# Patient Record
Sex: Female | Born: 1945
Health system: Southern US, Community
[De-identification: ages and names within clinical notes are randomized; demographics above are authoritative.]

## PROBLEM LIST (undated history)

## (undated) DIAGNOSIS — K219 Gastro-esophageal reflux disease without esophagitis: Secondary | ICD-10-CM

## (undated) DIAGNOSIS — F32A Depression, unspecified: Secondary | ICD-10-CM

## (undated) DIAGNOSIS — M1990 Unspecified osteoarthritis, unspecified site: Secondary | ICD-10-CM

## (undated) DIAGNOSIS — H269 Unspecified cataract: Secondary | ICD-10-CM

## (undated) DIAGNOSIS — C801 Malignant (primary) neoplasm, unspecified: Secondary | ICD-10-CM

## (undated) HISTORY — DX: Gastro-esophageal reflux disease without esophagitis: K21.9

## (undated) HISTORY — PX: EYE SURGERY: SHX253

## (undated) HISTORY — DX: Unspecified cataract: H26.9

## (undated) HISTORY — DX: Depression, unspecified: F32.A

---

## 1998-03-12 ENCOUNTER — Other Ambulatory Visit: Admission: RE | Admit: 1998-03-12 | Discharge: 1998-03-12 | Payer: Self-pay | Admitting: Family Medicine

## 1999-04-13 ENCOUNTER — Other Ambulatory Visit: Admission: RE | Admit: 1999-04-13 | Discharge: 1999-04-13 | Payer: Self-pay | Admitting: Family Medicine

## 2000-05-29 ENCOUNTER — Other Ambulatory Visit: Admission: RE | Admit: 2000-05-29 | Discharge: 2000-05-29 | Payer: Self-pay | Admitting: Family Medicine

## 2001-06-04 ENCOUNTER — Other Ambulatory Visit: Admission: RE | Admit: 2001-06-04 | Discharge: 2001-06-04 | Payer: Self-pay | Admitting: Family Medicine

## 2001-06-13 HISTORY — PX: CHOLECYSTECTOMY: SHX55

## 2002-06-07 ENCOUNTER — Other Ambulatory Visit: Admission: RE | Admit: 2002-06-07 | Discharge: 2002-06-07 | Payer: Self-pay | Admitting: Family Medicine

## 2003-10-03 ENCOUNTER — Other Ambulatory Visit: Admission: RE | Admit: 2003-10-03 | Discharge: 2003-10-03 | Payer: Self-pay | Admitting: Family Medicine

## 2004-06-14 HISTORY — PX: ACHILLES TENDON REPAIR: SUR1153

## 2004-10-06 ENCOUNTER — Ambulatory Visit: Payer: Self-pay | Admitting: Family Medicine

## 2004-10-08 ENCOUNTER — Encounter: Payer: Self-pay | Admitting: Family Medicine

## 2004-10-08 ENCOUNTER — Ambulatory Visit: Payer: Self-pay | Admitting: Family Medicine

## 2004-10-08 ENCOUNTER — Other Ambulatory Visit: Admission: RE | Admit: 2004-10-08 | Discharge: 2004-10-08 | Payer: Self-pay | Admitting: Family Medicine

## 2004-10-22 ENCOUNTER — Ambulatory Visit: Payer: Self-pay | Admitting: Family Medicine

## 2004-11-18 ENCOUNTER — Ambulatory Visit: Payer: Self-pay | Admitting: Family Medicine

## 2004-12-08 ENCOUNTER — Ambulatory Visit: Payer: Self-pay | Admitting: Family Medicine

## 2005-01-07 ENCOUNTER — Ambulatory Visit: Payer: Self-pay | Admitting: Family Medicine

## 2005-01-12 ENCOUNTER — Ambulatory Visit: Payer: Self-pay | Admitting: Family Medicine

## 2005-10-10 ENCOUNTER — Ambulatory Visit: Payer: Self-pay | Admitting: Family Medicine

## 2005-10-13 ENCOUNTER — Ambulatory Visit: Payer: Self-pay | Admitting: Family Medicine

## 2005-10-13 ENCOUNTER — Encounter: Payer: Self-pay | Admitting: Family Medicine

## 2005-10-13 ENCOUNTER — Other Ambulatory Visit: Admission: RE | Admit: 2005-10-13 | Discharge: 2005-10-13 | Payer: Self-pay | Admitting: Family Medicine

## 2005-10-13 LAB — CONVERTED CEMR LAB: Pap Smear: NORMAL

## 2005-10-26 ENCOUNTER — Ambulatory Visit: Payer: Self-pay | Admitting: Family Medicine

## 2005-12-02 ENCOUNTER — Ambulatory Visit: Payer: Self-pay | Admitting: Family Medicine

## 2006-04-27 ENCOUNTER — Ambulatory Visit: Payer: Self-pay | Admitting: Family Medicine

## 2006-10-03 ENCOUNTER — Encounter: Payer: Self-pay | Admitting: Family Medicine

## 2006-10-04 DIAGNOSIS — N951 Menopausal and female climacteric states: Secondary | ICD-10-CM | POA: Insufficient documentation

## 2006-10-04 DIAGNOSIS — N809 Endometriosis, unspecified: Secondary | ICD-10-CM

## 2006-10-04 DIAGNOSIS — E7800 Pure hypercholesterolemia, unspecified: Secondary | ICD-10-CM | POA: Insufficient documentation

## 2006-10-04 HISTORY — DX: Endometriosis, unspecified: N80.9

## 2006-10-25 ENCOUNTER — Ambulatory Visit: Payer: Self-pay | Admitting: Family Medicine

## 2006-10-25 LAB — CONVERTED CEMR LAB
AST: 23 units/L (ref 0–37)
BUN: 16 mg/dL (ref 6–23)
CO2: 29 meq/L (ref 19–32)
Calcium: 9.1 mg/dL (ref 8.4–10.5)
Creatinine, Ser: 0.8 mg/dL (ref 0.4–1.2)
TSH: 1.15 microintl units/mL (ref 0.35–5.50)
Triglycerides: 88 mg/dL (ref 0–149)

## 2006-10-30 ENCOUNTER — Ambulatory Visit: Payer: Self-pay | Admitting: Family Medicine

## 2006-10-30 ENCOUNTER — Other Ambulatory Visit: Admission: RE | Admit: 2006-10-30 | Discharge: 2006-10-30 | Payer: Self-pay | Admitting: Family Medicine

## 2006-10-30 ENCOUNTER — Encounter: Payer: Self-pay | Admitting: Family Medicine

## 2006-11-02 ENCOUNTER — Encounter (INDEPENDENT_AMBULATORY_CARE_PROVIDER_SITE_OTHER): Payer: Self-pay | Admitting: *Deleted

## 2006-11-14 ENCOUNTER — Ambulatory Visit: Payer: Self-pay | Admitting: Family Medicine

## 2006-11-15 ENCOUNTER — Encounter (INDEPENDENT_AMBULATORY_CARE_PROVIDER_SITE_OTHER): Payer: Self-pay | Admitting: *Deleted

## 2006-12-04 ENCOUNTER — Encounter: Payer: Self-pay | Admitting: Family Medicine

## 2006-12-04 ENCOUNTER — Ambulatory Visit: Payer: Self-pay | Admitting: Family Medicine

## 2006-12-05 ENCOUNTER — Encounter (INDEPENDENT_AMBULATORY_CARE_PROVIDER_SITE_OTHER): Payer: Self-pay | Admitting: *Deleted

## 2007-02-05 ENCOUNTER — Telehealth (INDEPENDENT_AMBULATORY_CARE_PROVIDER_SITE_OTHER): Payer: Self-pay | Admitting: *Deleted

## 2007-03-20 ENCOUNTER — Encounter: Payer: Self-pay | Admitting: Family Medicine

## 2007-03-29 ENCOUNTER — Encounter: Payer: Self-pay | Admitting: Family Medicine

## 2007-08-06 ENCOUNTER — Ambulatory Visit: Payer: Self-pay | Admitting: Family Medicine

## 2007-11-02 ENCOUNTER — Ambulatory Visit: Payer: Self-pay | Admitting: Family Medicine

## 2007-11-03 LAB — CONVERTED CEMR LAB
ALT: 20 units/L (ref 0–35)
Bilirubin, Direct: 0.1 mg/dL (ref 0.0–0.3)
CO2: 28 meq/L (ref 19–32)
Calcium: 9.2 mg/dL (ref 8.4–10.5)
Creatinine, Ser: 0.8 mg/dL (ref 0.4–1.2)
Glucose, Bld: 89 mg/dL (ref 70–99)
HDL: 62.6 mg/dL (ref >39.0)
TSH: 1.26 microintl units/mL (ref 0.35–5.50)
Total Protein: 6.6 g/dL (ref 6.0–8.3)
Triglycerides: 60 mg/dL (ref 0–149)

## 2007-11-05 ENCOUNTER — Other Ambulatory Visit: Admission: RE | Admit: 2007-11-05 | Discharge: 2007-11-05 | Payer: Self-pay | Admitting: Family Medicine

## 2007-11-05 ENCOUNTER — Ambulatory Visit: Payer: Self-pay | Admitting: Family Medicine

## 2007-11-05 ENCOUNTER — Encounter: Payer: Self-pay | Admitting: Family Medicine

## 2007-11-07 ENCOUNTER — Encounter (INDEPENDENT_AMBULATORY_CARE_PROVIDER_SITE_OTHER): Payer: Self-pay | Admitting: *Deleted

## 2007-11-30 ENCOUNTER — Ambulatory Visit: Payer: Self-pay | Admitting: Family Medicine

## 2007-11-30 LAB — CONVERTED CEMR LAB
OCCULT 1: NEGATIVE
OCCULT 2: NEGATIVE
OCCULT 3: NEGATIVE

## 2007-12-03 ENCOUNTER — Encounter (INDEPENDENT_AMBULATORY_CARE_PROVIDER_SITE_OTHER): Payer: Self-pay | Admitting: *Deleted

## 2007-12-06 ENCOUNTER — Ambulatory Visit: Payer: Self-pay | Admitting: Family Medicine

## 2007-12-06 ENCOUNTER — Encounter: Payer: Self-pay | Admitting: Family Medicine

## 2007-12-12 ENCOUNTER — Telehealth (INDEPENDENT_AMBULATORY_CARE_PROVIDER_SITE_OTHER): Payer: Self-pay | Admitting: *Deleted

## 2007-12-17 ENCOUNTER — Encounter: Payer: Self-pay | Admitting: Family Medicine

## 2007-12-17 ENCOUNTER — Ambulatory Visit: Payer: Self-pay | Admitting: Family Medicine

## 2007-12-24 ENCOUNTER — Encounter (INDEPENDENT_AMBULATORY_CARE_PROVIDER_SITE_OTHER): Payer: Self-pay | Admitting: *Deleted

## 2008-05-26 ENCOUNTER — Ambulatory Visit: Payer: Self-pay | Admitting: Family Medicine

## 2008-05-26 DIAGNOSIS — R1011 Right upper quadrant pain: Secondary | ICD-10-CM | POA: Insufficient documentation

## 2008-11-03 ENCOUNTER — Ambulatory Visit: Payer: Self-pay | Admitting: Family Medicine

## 2008-11-04 LAB — CONVERTED CEMR LAB
AST: 21 units/L (ref 0–37)
BUN: 20 mg/dL (ref 6–23)
Calcium: 9.1 mg/dL (ref 8.4–10.5)
Cholesterol: 151 mg/dL (ref 0–200)
Creatinine,U: 49 mg/dL
GFR calc non Af Amer: 67.18 mL/min (ref >60)
LDL Cholesterol: 75 mg/dL (ref 0–99)
Microalb Creat Ratio: 2 mg/g (ref 0.0–30.0)
Microalb, Ur: 0.1 mg/dL (ref 0.0–1.9)
Potassium: 4 meq/L (ref 3.5–5.1)
TSH: 1.27 microintl units/mL (ref 0.35–5.50)
Total Bilirubin: 0.8 mg/dL (ref 0.3–1.2)
VLDL: 13.6 mg/dL (ref 0.0–40.0)

## 2008-11-06 ENCOUNTER — Encounter: Payer: Self-pay | Admitting: Family Medicine

## 2008-11-06 ENCOUNTER — Other Ambulatory Visit: Admission: RE | Admit: 2008-11-06 | Discharge: 2008-11-06 | Payer: Self-pay | Admitting: Family Medicine

## 2008-11-06 ENCOUNTER — Ambulatory Visit: Payer: Self-pay | Admitting: Family Medicine

## 2008-11-06 LAB — CONVERTED CEMR LAB: Pap Smear: NORMAL

## 2008-11-11 ENCOUNTER — Encounter (INDEPENDENT_AMBULATORY_CARE_PROVIDER_SITE_OTHER): Payer: Self-pay | Admitting: *Deleted

## 2008-11-27 ENCOUNTER — Ambulatory Visit: Payer: Self-pay | Admitting: Family Medicine

## 2008-11-27 ENCOUNTER — Encounter (INDEPENDENT_AMBULATORY_CARE_PROVIDER_SITE_OTHER): Payer: Self-pay | Admitting: *Deleted

## 2008-11-27 LAB — CONVERTED CEMR LAB: OCCULT 1: NEGATIVE

## 2008-12-10 ENCOUNTER — Encounter: Payer: Self-pay | Admitting: Family Medicine

## 2008-12-10 ENCOUNTER — Ambulatory Visit: Payer: Self-pay | Admitting: Family Medicine

## 2008-12-10 LAB — HM MAMMOGRAPHY: HM Mammogram: NORMAL

## 2009-05-04 ENCOUNTER — Encounter (INDEPENDENT_AMBULATORY_CARE_PROVIDER_SITE_OTHER): Payer: Self-pay | Admitting: *Deleted

## 2009-08-26 ENCOUNTER — Encounter (INDEPENDENT_AMBULATORY_CARE_PROVIDER_SITE_OTHER): Payer: Self-pay | Admitting: *Deleted

## 2009-11-09 ENCOUNTER — Ambulatory Visit: Payer: Self-pay | Admitting: Family Medicine

## 2009-11-09 ENCOUNTER — Telehealth: Payer: Self-pay | Admitting: Family Medicine

## 2009-11-09 DIAGNOSIS — F5102 Adjustment insomnia: Secondary | ICD-10-CM | POA: Insufficient documentation

## 2009-11-09 LAB — CONVERTED CEMR LAB
ALT: 18 units/L (ref 0–35)
AST: 25 units/L (ref 0–37)
Alkaline Phosphatase: 74 units/L (ref 39–117)
Bilirubin, Direct: 0.1 mg/dL (ref 0.0–0.3)
Chloride: 107 meq/L (ref 96–112)
Cholesterol: 148 mg/dL (ref 0–200)
GFR calc non Af Amer: 74.56 mL/min (ref >60)
LDL Cholesterol: 69 mg/dL (ref 0–99)
Potassium: 4.2 meq/L (ref 3.5–5.1)
Sodium: 142 meq/L (ref 135–145)
Total Bilirubin: 0.9 mg/dL (ref 0.3–1.2)
Total CHOL/HDL Ratio: 2

## 2009-11-11 ENCOUNTER — Ambulatory Visit: Payer: Self-pay | Admitting: Family Medicine

## 2009-12-14 ENCOUNTER — Ambulatory Visit: Payer: Self-pay | Admitting: Family Medicine

## 2009-12-14 ENCOUNTER — Encounter: Payer: Self-pay | Admitting: Family Medicine

## 2010-02-23 NOTE — Assessment & Plan Note (Signed)
Summary: CPX/CLE   Vital Signs:  Patient profile:   65 year old female Height:      68.25 inches Weight:      171.0 pounds BMI:     25.90 Temp:     98.5 degrees F oral Pulse rate:   60 / minute Pulse rhythm:   regular BP sitting:   118 / 80  (left arm) Cuff size:   regular  Vitals Entered By: Benny Lennert CMA Duncan Dull) (November 09, 2009 8:01 AM)  History of Present Illness: 65 yo here to for CPX.   HLD- has been on Crestor 10 mg for years.  NO myalgias or other complaints.  Very physically active, plays competite tennis several times per week. Sometimes has difficulty falling asleep before a big match.  Takes a benadryl, usually works.  If she wakes up in the middle of night, cannot fall back to sleep.  No h/o abnormal pap smears.  Has never had a colonscopy, would prefer to do stool cards.  Current Medications (verified): 1)  Crestor 10 Mg  Tabs (Rosuvastatin Calcium) .... One By Mouth Daily 2)  Multivitamins   Tabs (Multiple Vitamin) .... Take One By Mouth Once A Day 3)  Citracal + D 250-200 Mg-Unit  Tabs (Calcium Citrate-Vitamin D) .... Take One By Mouth Once A Day 4)  Ibuprofen 200 Mg Tabs (Ibuprofen) .... As Needed 5)  Ambien 5 Mg Tabs (Zolpidem Tartrate) .Marland Kitchen.. 1 Tab By Mouth At Bedtime As Needed Insomnia  Allergies: 1)  ! Hydrocodone 2)  ! Vytorin (Ezetimibe-Simvastatin) 3)  Prednisone  Past History:  Past Surgical History: Last updated: 10/03/2006 Caeserean Section x 1 Placenta Previa Cholecystectomy (06/13/2001) Dexa- normal (01/22/2002) Achilles tendon resection (06/14/2004)  Family History: Last updated: 2008-11-22 Father: died age 42- cancer (pancreatic ?)  and stroke Mother: dec 89 Natural causes MI, dementia, CABG Sister A 13    Social History: Last updated: 10/03/2006 Marital Status: Married Children: 1 Occupation: home  Risk Factors: Alcohol Use: <1 (2008/11/22) Caffeine Use: 2 (11-22-08) Exercise: yes (22-Nov-2008)  Risk  Factors: Smoking Status: never (22-Nov-2008) Passive Smoke Exposure: no (November 22, 2008)  Review of Systems      See HPI General:  Denies malaise. Eyes:  Denies blurring. ENT:  Denies difficulty swallowing. CV:  Denies chest pain or discomfort. Resp:  Denies shortness of breath. GI:  Denies abdominal pain, bloody stools, and change in bowel habits. GU:  Denies abnormal vaginal bleeding and discharge. MS:  Denies joint pain, joint redness, and joint swelling. Derm:  Denies rash. Neuro:  Denies headaches. Psych:  Denies anxiety and depression. Endo:  Denies cold intolerance and heat intolerance. Heme:  Denies abnormal bruising and bleeding.  Physical Exam  General:  Well-developed,well-nourished,in no acute distress; alert,appropriate and cooperative throughout examination Head:  Normocephalic and atraumatic without obvious abnormalities. No apparent alopecia or balding. Eyes:  No corneal or conjunctival inflammation noted. EOMI. Perrla. Funduscopic exam benign, without hemorrhages, exudates or papilledema. Vision grossly normal. Ears:  External ear exam shows no significant lesions or deformities.  Otoscopic examination reveals clear canals, tympanic membranes are intact bilaterally without bulging, retraction, inflammation or discharge. Hearing is grossly normal bilaterally. Nose:  External nasal examination shows no deformity or inflammation. Nasal mucosa are pink and moist without lesions or exudates. Mouth:  Oral mucosa and oropharynx without lesions or exudates.  Teeth in good repair. Neck:  no carotid bruit or thyromegaly no cervical or supraclavicular lymphadenopathy  Breasts:  No mass, nodules, thickening, tenderness, bulging, retraction, inflamation, nipple  discharge or skin changes noted.   Lungs:  Normal respiratory effort, chest expands symmetrically. Lungs are clear to auscultation, no crackles or wheezes. Heart:  Normal rate and regular rhythm. S1 and S2 normal without gallop,  murmur, click, rub or other extra sounds. Abdomen:  Bowel sounds positive,abdomen soft and non-tender without masses, organomegaly or hernias noted. Msk:  No deformity or scoliosis noted of thoracic or lumbar spine.   Extremities:  No clubbing, cyanosis, edema, or deformity noted with normal full range of motion of all joints.   Neurologic:  No cranial nerve deficits noted. Station and gait are normal. Plantar reflexes are down-going bilaterally. DTRs are symmetrical throughout. Sensory, motor and coordinative functions appear intact. Skin:  Intact without suspicious lesions or rashes. Prior rash resolved. Psych:  Cognition and judgment appear intact. Alert and cooperative with normal attention span and concentration. No apparent delusions, illusions, hallucinations, good eye contact.   Impression & Recommendations:  Problem # 1:  HEALTH MAINTENANCE EXAM (ICD-V70.0) Reviewed preventive care protocols, scheduled due services, and updated immunizations Discussed nutrition, exercise, diet, and healthy lifestyle.  BMET today. Stool cards ordered today. Mammogram ordered today. Orders: TLB-BMP (Basic Metabolic Panel-BMET) (80048-METABOL)  Problem # 2:  TRANSIENT DISORDER INITIATING/MAINTAINING SLEEP (ICD-307.41) Assessment: New Ok to use antihistamines.  Ambien rx given for more refractory occassions.  Problem # 3:  Hx of HYPERCHOLESTEROLEMIA (ICD-272.0) Assessment: Unchanged recheck FLP, hepatic panel today. Her updated medication list for this problem includes:    Crestor 10 Mg Tabs (Rosuvastatin calcium) ..... One by mouth daily  Orders: Venipuncture (81191) TLB-Lipid Panel (80061-LIPID) TLB-Hepatic/Liver Function Pnl (80076-HEPATIC)  Complete Medication List: 1)  Crestor 10 Mg Tabs (Rosuvastatin calcium) .... One by mouth daily 2)  Multivitamins Tabs (Multiple vitamin) .... Take one by mouth once a day 3)  Citracal + D 250-200 Mg-unit Tabs (Calcium citrate-vitamin d) .... Take  one by mouth once a day 4)  Ibuprofen 200 Mg Tabs (Ibuprofen) .... As needed 5)  Ambien 5 Mg Tabs (Zolpidem tartrate) .Marland Kitchen.. 1 tab by mouth at bedtime as needed insomnia  Other Orders: TLB-TSH (Thyroid Stimulating Hormone) (47829-FAO) Radiology Referral (Radiology)  Patient Instructions: 1)  Great to meet you. 2)  please stop by to see Shirlee Limerick on your way out. Prescriptions: AMBIEN 5 MG TABS (ZOLPIDEM TARTRATE) 1 tab by mouth at bedtime as needed insomnia  #20 x 0   Entered and Authorized by:   [name] MD   Signed by:   [name] MD on 11/09/2009   Method used:   Print then Give to Patient   RxID:   [id number] CRESTOR 10 MG  TABS (ROSUVASTATIN CALCIUM) one by mouth daily  #90 x 4   Entered and Authorized by:   [name] MD   Signed by:   [name] MD on 11/09/2009   Method used:   Print then Give to Patient   RxID:   [id number]    Orders Added: 1)  Venipuncture [64403] 2)  TLB-Lipid Panel [80061-LIPID] 3)  TLB-BMP (Basic Metabolic Panel-BMET) [80048-METABOL] 4)  TLB-Hepatic/Liver Function Pnl [80076-HEPATIC] 5)  TLB-TSH (Thyroid Stimulating Hormone) [84443-TSH] 6)  Radiology Referral [Radiology] 7)  Est. Patient 40-64 years [47425]    Current Allergies (reviewed today): ! HYDROCODONE ! VYTORIN (EZETIMIBE-SIMVASTATIN) PREDNISONE Last Flu Vaccine:  Fluvax 3+ (11/05/2007 11:57:40 AM) Flu Vaccine Result Date:  11/09/2009 Flu Vaccine Result:  given Last PAP:  Normal, Satisfactory (11/06/2008 4:30:54 PM) PAP Next Due:  2 yr     Appended Document: CPX/CLE  Allergies: 1)  ! Hydrocodone 2)  ! Vytorin (Ezetimibe-Simvastatin) 3)  Prednisone   Complete Medication List: 1)  Crestor 10 Mg Tabs (Rosuvastatin calcium) .... One by mouth daily 2)  Multivitamins Tabs (Multiple vitamin) .... Take one by mouth once a day 3)  Citracal + D 250-200 Mg-unit Tabs (Calcium citrate-vitamin d) .... Take one by mouth once a day 4)  Ibuprofen 200 Mg Tabs (Ibuprofen)  .... As needed 5)  Ambien 5 Mg Tabs (Zolpidem tartrate) .Marland Kitchen.. 1 tab by mouth at bedtime as needed insomnia  Other Orders: Flu Vaccine 80yrs + (57846) Admin 1st Vaccine (96295)   Orders Added: 1)  Flu Vaccine 49yrs + [28413] 2)  Admin 1st Vaccine [24401]   Immunizations Administered:  Influenza Vaccine # 1:    Vaccine Type: Fluvax 3+    Site: left deltoid    Mfr: GlaxoSmithKline    Dose: 0.5 ml    Route: IM    Given by: Linde Gillis CMA (AAMA)    Exp. Date: 07/24/2010    Lot #: [id number]    VIS given: 08/18/09 version given November 09, 2009.  Flu Vaccine Consent Questions:    Do you have a history of severe allergic reactions to this vaccine? no    Any prior history of allergic reactions to egg and/or gelatin? no    Do you have a sensitivity to the preservative Thimersol? no    Do you have a past history of Guillan-Barre Syndrome? no    Do you currently have an acute febrile illness? no    Have you ever had a severe reaction to latex? no    Vaccine information given and explained to patient? yes    Are you currently pregnant? no   Immunizations Administered:  Influenza Vaccine # 1:    Vaccine Type: Fluvax 3+    Site: left deltoid    Mfr: GlaxoSmithKline    Dose: 0.5 ml    Route: IM    Given by: Linde Gillis CMA (AAMA)    Exp. Date: 07/24/2010    Lot #: [id number]    VIS given: 08/18/09 version given November 09, 2009.

## 2010-02-23 NOTE — Letter (Signed)
Summary: Results Follow up Letter  [place] at [facility]  [address] [place], [place] [address]   Phone: [phone]  Fax: [phone]    05/04/2009 MRN: [id number]  DURU REIGER [address] [place], [place]  [address]  Dear Ms. Boys,  The following are the results of your recent test(s):  Test         Result    Pap Smear:        Normal _____  Not Normal _____ Comments: ______________________________________________________ Cholesterol: LDL(Bad cholesterol):         Your goal is less than:         HDL (Good cholesterol):       Your goal is more than: Comments:  ______________________________________________________ Mammogram:        Normal _X____  Not Normal _____ Comments: Please repeat in one year.  ___________________________________________________________________ Hemoccult:        Normal _____  Not normal _______ Comments:    _____________________________________________________________________ Other Tests:    We routinely do not discuss normal results over the telephone.  If you desire a copy of the results, or you have any questions about this information we can discuss them at your next office visit.   Sincerely,      Laurita Quint, MD

## 2010-02-23 NOTE — Letter (Signed)
Summary: [place] [facility]: Immunoassay Fecal Occult Blood (iFOB) Order Form  [place] at [facility]  [address] [place], [place] [address]   Phone: [phone]  Fax: [phone]      [place] [facility]: Immunoassay Fecal Occult Blood (iFOB) Order Form   November 09, 2009 MRN: [id number]   Natalie Sosa July 23, 1945   Physicican Name:______Talia Aron___________________  Diagnosis Code:______V82.7____________________      [name] MD

## 2010-02-23 NOTE — Letter (Signed)
Summary: Nadara Eaton letter  [place] at [facility]  [address] [place], [place] [address]   Phone: [phone]  Fax: [phone]       08/26/2009 MRN: [id number]  CHRISA HASSAN [address] [place], [place]  [address]  Dear Ms. [facility] [place] and [place] announce the retirement of Arta Silence, M.D., from full-time practice at the [facility] office effective July 23, 2009 and his plans of returning part-time.  It is important to Dr. Hetty Ely and to our practice that you understand that [facility] has seven physicians in our office for your health care needs.  We will continue to offer the same exceptional care that you have today.    Dr. Hetty Ely has spoken to many of you about his plans for retirement and returning part-time in the fall.   We will continue to work with you through the transition to schedule appointments for you in the office and meet the high standards that [facility] is committed to.   Again, it is with great pleasure that we share the news that Dr. Hetty Ely will return to [facility] in October of 2011 with a reduced schedule.    If you have any questions, or would like to request an appointment with one of our physicians, please call us at [phone] and press the option for Scheduling an appointment.  We take pleasure in providing you with excellent patient care and look forward to seeing you at your next office visit.  Our [facility] Physicians are:  Tillman Abide, M.D. Laurita Quint, M.D. Roxy Manns, M.D. Kerby Nora, M.D. Hannah Beat, M.D. [name], M.D. We proudly welcomed Raechel Ache, M.D. and Eustaquio Boyden, M.D. to the practice in July/August 2011.  Sincerely,  [facility]

## 2010-02-23 NOTE — Letter (Signed)
Summary: Results Follow up Letter  [place] at [facility]  [address] [place], [place] [address]   Phone: [phone]  Fax: [phone]    11/11/2009 MRN: [id number]  Natalie Sosa [address] [place], [place]  [address]  Dear [name],  The following are the results of your recent test(s):  Test         Result    Pap Smear:        Normal _____  Not Normal _____ Comments: ______________________________________________________ Cholesterol: LDL(Bad cholesterol):         Your goal is less than:         HDL (Good cholesterol):       Your goal is more than: Comments:  ______________________________________________________ Mammogram:        Normal _____  Not Normal _____ Comments:  ___________________________________________________________________ Hemoccult:        Normal __X___  Not normal _______ Comments:    _____________________________________________________________________ Other Tests:    We routinely do not discuss normal results over the telephone.  If you desire a copy of the results, or you have any questions about this information we can discuss them at your next office visit.   Sincerely,       Linde Gillis, [facility] (AAMA)for Dr. [name]

## 2010-02-23 NOTE — Letter (Signed)
Summary: Generic Letter   at [facility]  [address] [place], [place] [address]   Phone: [phone]  Fax: [phone]    11/09/2009  Natalie Sosa [address] [place], [place]  [address]  Dear [name],     We have received your lab results and Dr. Dayton Martes says that your labs are excellent!!  Enclosed is a copy of your lab results.      Sincerely,        Linde Gillis CMA (AAMA)for Dr. [name]

## 2010-02-23 NOTE — Progress Notes (Signed)
Summary: crestor  Phone Note Call from Patient Call back at Home Phone [phone]   Caller: Patient Call For: Dr. Milinda Antis  Summary of Call: Patient is taking crestor and says that it is tool expensive. She is asking if she could get a written script for something less expensive. Please call patient when ready.  Initial call taken by: Melody Comas,  November 09, 2009 2:24 PM  Follow-up for Phone Call        Simvastatin is generic if she can tolerate that.   [name] MD  November 09, 2009 2:30 PM  Patient advised as instructed via telephone.  She is ok with trying Simvastatin, just would need a written Rx so she can mail it in to her mail order pharmacy.  She will pick up the Rx around 4:00pm today.  Linde Gillis CMA Duncan Dull)  November 09, 2009 2:35 PM      New/Updated Medications: SIMVASTATIN 20 MG TABS (SIMVASTATIN) 1 by mouth at bedtime Prescriptions: SIMVASTATIN 20 MG TABS (SIMVASTATIN) 1 by mouth at bedtime  #90 x 3   Entered and Authorized by:   [name] MD   Signed by:   [name] MD on 11/09/2009   Method used:   Print then Give to Patient   RxID:   [id number]

## 2010-11-08 ENCOUNTER — Encounter: Payer: Self-pay | Admitting: Family Medicine

## 2010-11-11 ENCOUNTER — Encounter: Payer: Self-pay | Admitting: Family Medicine

## 2010-11-11 ENCOUNTER — Other Ambulatory Visit (HOSPITAL_COMMUNITY)
Admission: RE | Admit: 2010-11-11 | Discharge: 2010-11-11 | Disposition: A | Discharge status: Home / Self Care | Payer: BC Managed Care – HMO | Source: Ambulatory Visit | Attending: Family Medicine | Admitting: Family Medicine

## 2010-11-11 ENCOUNTER — Ambulatory Visit (INDEPENDENT_AMBULATORY_CARE_PROVIDER_SITE_OTHER): Payer: MEDICARE | Admitting: Family Medicine

## 2010-11-11 VITALS — BP 128/76 | HR 72 | Temp 97.7°F | Ht 67.75 in | Wt 164.2 lb

## 2010-11-11 DIAGNOSIS — Z0000 Encounter for general adult medical examination without abnormal findings: Secondary | ICD-10-CM

## 2010-11-11 DIAGNOSIS — Z780 Asymptomatic menopausal state: Secondary | ICD-10-CM

## 2010-11-11 DIAGNOSIS — E7800 Pure hypercholesterolemia, unspecified: Secondary | ICD-10-CM

## 2010-11-11 DIAGNOSIS — Z01419 Encounter for gynecological examination (general) (routine) without abnormal findings: Secondary | ICD-10-CM | POA: Insufficient documentation

## 2010-11-11 DIAGNOSIS — Z124 Encounter for screening for malignant neoplasm of cervix: Secondary | ICD-10-CM

## 2010-11-11 DIAGNOSIS — Z23 Encounter for immunization: Secondary | ICD-10-CM

## 2010-11-11 DIAGNOSIS — Z1231 Encounter for screening mammogram for malignant neoplasm of breast: Secondary | ICD-10-CM

## 2010-11-11 LAB — BASIC METABOLIC PANEL
BUN: 20 mg/dL (ref 6–23)
CO2: 30 mEq/L (ref 19–32)
Chloride: 105 mEq/L (ref 96–112)
GFR: 85 mL/min (ref >60.00)
Glucose, Bld: 94 mg/dL (ref 70–99)
Potassium: 4.2 mEq/L (ref 3.5–5.1)

## 2010-11-11 LAB — LIPID PANEL
LDL Cholesterol: 75 mg/dL (ref 0–99)
Total CHOL/HDL Ratio: 2
VLDL: 19.4 mg/dL (ref 0.0–40.0)

## 2010-11-11 LAB — HEPATIC FUNCTION PANEL
Bilirubin, Direct: 0 mg/dL (ref 0.0–0.3)
Total Bilirubin: 0.6 mg/dL (ref 0.3–1.2)

## 2010-11-11 MED ORDER — SIMVASTATIN 20 MG PO TABS: 20.0000 mg | ORAL_TABLET | Freq: Every day | ORAL | Status: DC

## 2010-11-11 NOTE — Progress Notes (Signed)
Addended by: Patience Musca on: 11/11/2010 09:11 AM   Modules accepted: Orders

## 2010-11-11 NOTE — Progress Notes (Signed)
Addended by: Consuello Masse on: 11/11/2010 09:36 AM   Modules accepted: Orders

## 2010-11-11 NOTE — Patient Instructions (Addendum)
  Please stop by to see Shirlee Limerick on your way out to set up your mammogram and bone density.     Ages 48 and over  Blood pressure check.** / Every 1 to 2 years.   Lipid and cholesterol check.**/ Every 5 years beginning at age 65.   Clinical breast exam.** / Every year after age 71.   Mammogram.** / Every year beginning at age 3 and continuing for as long as you are in good health. Consult with your caregiver.   Pap Test,** / Every 3 years starting at age 37 years through age 74 or 36 with a 3 consecutive normal Pap tests. Testing can be stopped between 65 and 70 with 3 consecutive normal Pap tests and no abnormal Pap or HPV tests in the past 10 years.   HPV Screening.** / Every 3 years from ages 52 through ages 9 or 59 with a history of 3 consecutive normal Pap tests. Testing can be stopped between 65 and 70 with 3 consecutive normal Pap tests and no abnormal Pap or HPV tests in the past 10 years.   Fecal occult blood test (FOBT) of stool. / Every year beginning at age 73 and continuing until age 85. You may not have to do this test if you get colonoscopy every 10 years.   Osteoporosis screening.** / A one-time screening for women ages 5 and over and women at risk for fractures or osteoporosis.   Skin self-exam. / Monthly.   Influenza immunization.** / Every year.   Pneumococcal polysaccharide immunization.** / 1 dose at age 77 (or older) if you have never been vaccinated.   Tetanus, diphtheria, pertussis (Tdap, Td) immunization. / A one-time dose of Tdap vaccine if you are over 65 and have contact with an infant, are a Research scientist (physical sciences), or simply want to be protected from whooping cough. After that, you need a Td booster dose every 10 years.   Varicella immunization. **/ Consult your caregiver.   Meningococcal immunization.** / Consult your caregiver.   Hepatitis A immunization. ** / Consult your caregiver. 2 doses, 6 to 18 months apart.   Hepatitis B immunization.** / Check with  your caregiver. 3 doses, usually over 6 months.  ** Family history and personal history of risk and conditions may change your caregiver's recommendations. Document Released: 03/08/2001 Document Revised: 09/22/2010 Document Reviewed: 06/07/2010 [facility] Patient Information 2012 [place], [place].

## 2010-11-11 NOTE — Progress Notes (Signed)
Subjective:    Patient ID: Natalie Sosa, female    DOB: Sep 03, 1945, 65 y.o.   MRN: [id number]  HPI 65 yo here for Welcome to Medicare Physical.  I have personally reviewed the Medicare Annual Wellness questionnaire and have noted 1. The patient's medical and social history 2. Their use of alcohol, tobacco or illicit drugs 3. Their current medications and supplements 4. The patient's functional ability including ADL's, fall risks, home safety risks and hearing or visual             impairment. 5. Diet and physical activities 6. Evidence for depression or mood disorders  HLD- has been on Crestor 10 mg for years. NO myalgias or other complaints.  Very physically active, plays competite tennis several times per week.    No h/o abnormal pap smears. Has never had a colonscopy, would prefer to do stool cards.   Patient Active Problem List  Diagnoses  . HYPERCHOLESTEROLEMIA  . TRANSIENT DISORDER INITIATING/MAINTAINING SLEEP  . ENDOMETRIOSIS  . PERIMENOPAUSAL STATUS  . RUQ PAIN  . Routine general medical examination at a health care facility   No past medical history on file. Past Surgical History  Procedure Date  . Cesarean section     placenta previa  . Cholecystectomy 06/13/2001  . Achilles tendon repair 06/14/2004   History  Substance Use Topics  . Smoking status: Never Smoker   . Smokeless tobacco: Not on file  . Alcohol Use: Not on file   Family History  Problem Relation Age of Onset  . Heart disease Mother   . Dementia Mother   . Cancer Father     Pancreatic  . Stroke Father    Allergies  Allergen Reactions  . Ezetimibe-Simvastatin     REACTION: Achey  . Hydrocodone     REACTION: Itchy, rash  . Prednisone     REACTION: anxiety, hyper   Current Outpatient Prescriptions on File Prior to Visit  Medication Sig Dispense Refill  . ibuprofen (ADVIL,MOTRIN) 200 MG tablet Use as directed       . simvastatin (ZOCOR) 20 MG tablet Take 20 mg by mouth at bedtime.         . Calcium Citrate-Vitamin D 250-200 MG-UNIT TABS Take 1 tablet by mouth daily.        . Multiple Vitamin (MULTIVITAMIN) tablet Take 1 tablet by mouth daily.        Marland Kitchen zolpidem (AMBIEN) 5 MG tablet Take 5 mg by mouth at bedtime as needed.         The PMH, PSH, Social History, Family History, Medications, and allergies have been reviewed in [facility], and have been updated if relevant.    Review of Systems See HPI Patient reports no  vision/ hearing changes,anorexia, weight change, fever ,adenopathy, persistant / recurrent hoarseness, swallowing issues, chest pain, edema,persistant / recurrent cough, hemoptysis, dyspnea(rest, exertional, paroxysmal nocturnal), gastrointestinal  bleeding (melena, rectal bleeding), abdominal pain, excessive heart burn, GU symptoms(dysuria, hematuria, pyuria, voiding/incontinence  Issues) syncope, focal weakness, severe memory loss, concerning skin lesions, depression, anxiety, abnormal bruising/bleeding, major joint swelling, breast masses or abnormal vaginal bleeding.       Objective:   Physical Exam BP 128/76  Pulse 72  Temp(Src) 97.7 F (36.5 C) (Oral)  Ht 5' 7.75" (1.721 m)  Wt 164 lb 4 oz (74.503 kg)  BMI 25.16 kg/m2  General:  Well-developed,well-nourished,in no acute distress; alert,appropriate and cooperative throughout examination Head:  normocephalic and atraumatic.   Eyes:  vision grossly intact, pupils equal,  pupils round, and pupils reactive to light.   Ears:  R ear normal and L ear normal.   Nose:  no external deformity.   Mouth:  good dentition.   Neck:  No deformities, masses, or tenderness noted. Breasts:  No mass, nodules, thickening, tenderness, bulging, retraction, inflamation, nipple discharge or skin changes noted.   Lungs:  Normal respiratory effort, chest expands symmetrically. Lungs are clear to auscultation, no crackles or wheezes. Heart:  Normal rate and regular rhythm. S1 and S2 normal without gallop, murmur, click, rub or other  extra sounds. Abdomen:  Bowel sounds positive,abdomen soft and non-tender without masses, organomegaly or hernias noted. Rectal:  no external abnormalities.   Genitalia:  Pelvic Exam:        External: normal female genitalia without lesions or masses        Vagina: normal without lesions or masses        Cervix: normal without lesions or masses        Adnexa: normal bimanual exam without masses or fullness        Uterus: normal by palpation        Pap smear: performed Msk:  No deformity or scoliosis noted of thoracic or lumbar spine.   Extremities:  No clubbing, cyanosis, edema, or deformity noted with normal full range of motion of all joints.   Neurologic:  alert & oriented X3 and gait normal.   Skin:  Intact without suspicious lesions or rashes Cervical Nodes:  No lymphadenopathy noted Axillary Nodes:  No palpable lymphadenopathy Psych:  Cognition and judgment appear intact. Alert and cooperative with normal attention span and concentration. No apparent delusions, illusions, hallucinations     Assessment & Plan:   1. Routine general medical examination at a health care facility   The patients weight, height, BMI and visual acuity have been recorded in the chart I have made referrals, counseling and provided education to the patient based review of the above and I have provided the pt with a written personalized care plan for preventive services.  Electrocardiogram report, Basic Metabolic Panel (BMET) Mammogram DEXA  Pneumovax  2. HYPERCHOLESTEROLEMIA   Stable.  Continue zocor 20 mg daily. Recheck labs today. Lipid Profile, Hepatic function panel, simvastatin (ZOCOR) 20 MG tablet

## 2010-11-16 ENCOUNTER — Encounter: Payer: Self-pay | Admitting: *Deleted

## 2010-11-18 ENCOUNTER — Other Ambulatory Visit: Payer: MEDICARE

## 2010-11-18 ENCOUNTER — Encounter: Payer: Self-pay | Admitting: *Deleted

## 2010-11-18 ENCOUNTER — Other Ambulatory Visit: Payer: Self-pay | Admitting: Family Medicine

## 2010-11-18 DIAGNOSIS — Z1211 Encounter for screening for malignant neoplasm of colon: Secondary | ICD-10-CM

## 2010-12-28 ENCOUNTER — Encounter: Payer: Self-pay | Admitting: *Deleted

## 2010-12-28 ENCOUNTER — Encounter: Payer: Self-pay | Admitting: Family Medicine

## 2010-12-28 ENCOUNTER — Ambulatory Visit: Payer: Self-pay | Admitting: Family Medicine

## 2010-12-29 ENCOUNTER — Ambulatory Visit (INDEPENDENT_AMBULATORY_CARE_PROVIDER_SITE_OTHER): Payer: MEDICARE

## 2010-12-29 DIAGNOSIS — Z23 Encounter for immunization: Secondary | ICD-10-CM

## 2011-01-10 ENCOUNTER — Ambulatory Visit: Payer: Self-pay | Admitting: Family Medicine

## 2011-01-10 ENCOUNTER — Encounter: Payer: Self-pay | Admitting: Family Medicine

## 2011-01-11 ENCOUNTER — Encounter: Payer: Self-pay | Admitting: *Deleted

## 2011-01-26 ENCOUNTER — Ambulatory Visit: Payer: Self-pay | Admitting: Dermatology

## 2011-11-14 ENCOUNTER — Encounter: Payer: Self-pay | Admitting: *Deleted

## 2011-11-14 ENCOUNTER — Ambulatory Visit (INDEPENDENT_AMBULATORY_CARE_PROVIDER_SITE_OTHER): Payer: Medicare Other | Admitting: Family Medicine

## 2011-11-14 ENCOUNTER — Encounter: Payer: Self-pay | Admitting: Family Medicine

## 2011-11-14 VITALS — BP 132/80 | HR 68 | Temp 98.0°F | Ht 67.75 in | Wt 174.0 lb

## 2011-11-14 DIAGNOSIS — E7800 Pure hypercholesterolemia, unspecified: Secondary | ICD-10-CM

## 2011-11-14 DIAGNOSIS — Z1231 Encounter for screening mammogram for malignant neoplasm of breast: Secondary | ICD-10-CM

## 2011-11-14 DIAGNOSIS — Z0000 Encounter for general adult medical examination without abnormal findings: Secondary | ICD-10-CM

## 2011-11-14 DIAGNOSIS — B351 Tinea unguium: Secondary | ICD-10-CM

## 2011-11-14 DIAGNOSIS — Z1211 Encounter for screening for malignant neoplasm of colon: Secondary | ICD-10-CM

## 2011-11-14 LAB — LIPID PANEL
Cholesterol: 163 mg/dL (ref 0–200)
HDL: 62.2 mg/dL (ref >39.00)
Total CHOL/HDL Ratio: 3
Triglycerides: 57 mg/dL (ref 0.0–149.0)

## 2011-11-14 LAB — COMPREHENSIVE METABOLIC PANEL
Albumin: 3.8 g/dL (ref 3.5–5.2)
BUN: 20 mg/dL (ref 6–23)
CO2: 27 mEq/L (ref 19–32)
Calcium: 9 mg/dL (ref 8.4–10.5)
Chloride: 107 mEq/L (ref 96–112)
Creatinine, Ser: 0.7 mg/dL (ref 0.4–1.2)
GFR: 83.42 mL/min (ref >60.00)
Glucose, Bld: 85 mg/dL (ref 70–99)
Potassium: 3.9 mEq/L (ref 3.5–5.1)

## 2011-11-14 MED ORDER — TERBINAFINE HCL 250 MG PO TABS: 250.0000 mg | ORAL_TABLET | Freq: Every day | ORAL | Status: DC

## 2011-11-14 MED ORDER — CICLOPIROX 8 % EX SOLN: Freq: Every day | CUTANEOUS | Status: DC

## 2011-11-14 MED ORDER — SIMVASTATIN 20 MG PO TABS: 20.0000 mg | ORAL_TABLET | Freq: Every day | ORAL | Status: DC

## 2011-11-14 NOTE — Addendum Note (Signed)
Addended by: Alvina Chou on: 11/14/2011 10:51 AM   Modules accepted: Orders

## 2011-11-14 NOTE — Patient Instructions (Addendum)
Good to see you. Please call me in a few weeks with an update of your symptoms.

## 2011-11-14 NOTE — Progress Notes (Signed)
Subjective:    Patient ID: Natalie Sosa, female    DOB: 1945/05/20, 66 y.o.   MRN: [id number]  HPI 66 yo here for Welcome to Medicare Physical.  I have personally reviewed the Medicare Annual Wellness questionnaire and have noted 1. The patient's medical and social history 2. Their use of alcohol, tobacco or illicit drugs 3. Their current medications and supplements 4. The patient's functional ability including ADL's, fall risks, home safety risks and hearing or visual             impairment. 5. Diet and physical activities 6. Evidence for depression or mood disorders  HLD- has been on Crestor 10 mg for years. NO myalgias or other complaints.  Very physically active, plays competite tennis several times per week.  Lab Results  Component Value Date   CHOL 161 11/11/2010   HDL 66.20 11/11/2010   LDLCALC 75 11/11/2010   TRIG 97.0 11/11/2010   CHOLHDL 2 11/11/2010   Pap smear normal last year- no post menopausal bleeding.    Has never had a colonscopy, would prefer to do stool cards.   Husband Natalie Sosa, aware of her end of life wishes.  She would desire CPR, no feedings tubes.  Finger nail oncho mycosis- saw dermatologist in March. Place on two courses of oral terbinafine.  Helped a little, but now it has returned.  All nails on both hands involved.  No toe nail involvement.  Patient Active Problem List  Diagnosis  . HYPERCHOLESTEROLEMIA  . TRANSIENT DISORDER INITIATING/MAINTAINING SLEEP  . ENDOMETRIOSIS  . PERIMENOPAUSAL STATUS  . RUQ PAIN  . Routine general medical examination at a health care facility   No past medical history on file. Past Surgical History  Procedure Date  . Cesarean section     placenta previa  . Cholecystectomy 06/13/2001  . Achilles tendon repair 06/14/2004   History  Substance Use Topics  . Smoking status: Never Smoker   . Smokeless tobacco: Not on file  . Alcohol Use: Not on file   Family History  Problem Relation Age of Onset  . Heart  disease Mother   . Dementia Mother   . Cancer Father     Pancreatic  . Stroke Father    Allergies  Allergen Reactions  . Ezetimibe-Simvastatin     REACTION: Achey  . Hydrocodone     REACTION: Itchy, rash  . Prednisone     REACTION: anxiety, hyper   Current Outpatient Prescriptions on File Prior to Visit  Medication Sig Dispense Refill  . Calcium Citrate-Vitamin D 250-200 MG-UNIT TABS Take 1 tablet by mouth daily.        Marland Kitchen ibuprofen (ADVIL,MOTRIN) 200 MG tablet Use as directed       . Multiple Vitamin (MULTIVITAMIN) tablet Take 1 tablet by mouth daily.        . simvastatin (ZOCOR) 20 MG tablet Take 1 tablet (20 mg total) by mouth at bedtime.  90 tablet  3  . zolpidem (AMBIEN) 5 MG tablet Take 5 mg by mouth at bedtime as needed.         The PMH, PSH, Social History, Family History, Medications, and allergies have been reviewed in [facility], and have been updated if relevant.    Review of Systems See HPI Patient reports no  vision/ hearing changes,anorexia, weight change, fever ,adenopathy, persistant / recurrent hoarseness, swallowing issues, chest pain, edema,persistant / recurrent cough, hemoptysis, dyspnea(rest, exertional, paroxysmal nocturnal), gastrointestinal  bleeding (melena, rectal bleeding), abdominal pain, excessive heart burn, GU  symptoms(dysuria, hematuria, pyuria, voiding/incontinence  Issues) syncope, focal weakness, severe memory loss, concerning skin lesions, depression, anxiety, abnormal bruising/bleeding, major joint swelling, breast masses or abnormal vaginal bleeding.       Objective:   Physical Exam There were no vitals taken for this visit.  General:  Well-developed,well-nourished,in no acute distress; alert,appropriate and cooperative throughout examination Head:  normocephalic and atraumatic.   Eyes:  vision grossly intact, pupils equal, pupils round, and pupils reactive to light.   Ears:  R ear normal and L ear normal.   Nose:  no external deformity.     Mouth:  good dentition.   Lungs:  Normal respiratory effort, chest expands symmetrically. Lungs are clear to auscultation, no crackles or wheezes. Heart:  Normal rate and regular rhythm. S1 and S2 normal without gallop, murmur, click, rub or other extra sounds. Abdomen:  Bowel sounds positive,abdomen soft and non-tender without masses, organomegaly or hernias noted. Msk:  No deformity or scoliosis noted of thoracic or lumbar spine.   Extremities:  No clubbing, cyanosis, edema, or deformity noted with normal full range of motion of all joints.   Neurologic:  alert & oriented X3 and gait normal.   Skin:  Intact without suspicious lesions or rashes Finger nails- white/yellowish plaques on all nails, mainly distally, no nail bed involvement or erythema. Psych:  Cognition and judgment appear intact. Alert and cooperative with normal attention span and concentration. No apparent delusions, illusions, hallucinations     Assessment & Plan:    1. Routine general medical examination at a health care facility  The patients weight, height, BMI and visual acuity have been recorded in the chart I have made referrals, counseling and provided education to the patient based review of the above and I have provided the pt with a written personalized care plan for preventive services.  Comprehensive metabolic panel  2. HYPERCHOLESTEROLEMIA  Recheck labs today. simvastatin (ZOCOR) 20 MG tablet, Lipid Panel  3. Other screening mammogram  MM Digital Screening  4. Screening for colon cancer  Fecal occult blood, imunochemical  5. Nail fungus  On statin, cannot take itraconazole. Will try oral treatment with ciclopirox. No indications of infection/paronychia.

## 2011-11-17 ENCOUNTER — Encounter: Payer: Self-pay | Admitting: *Deleted

## 2011-11-17 ENCOUNTER — Encounter: Payer: Self-pay | Admitting: Family Medicine

## 2011-11-17 ENCOUNTER — Other Ambulatory Visit: Payer: Medicare Other

## 2011-11-17 DIAGNOSIS — Z1211 Encounter for screening for malignant neoplasm of colon: Secondary | ICD-10-CM

## 2011-11-21 ENCOUNTER — Other Ambulatory Visit: Payer: Self-pay | Admitting: *Deleted

## 2011-11-21 DIAGNOSIS — E7800 Pure hypercholesterolemia, unspecified: Secondary | ICD-10-CM

## 2011-11-21 MED ORDER — SIMVASTATIN 20 MG PO TABS: 20.0000 mg | ORAL_TABLET | Freq: Every day | ORAL | Status: DC

## 2012-01-11 ENCOUNTER — Ambulatory Visit: Payer: Self-pay | Admitting: Family Medicine

## 2012-01-13 ENCOUNTER — Encounter: Payer: Self-pay | Admitting: Family Medicine

## 2012-01-16 ENCOUNTER — Encounter: Payer: Self-pay | Admitting: Family Medicine

## 2012-01-16 ENCOUNTER — Encounter: Payer: Self-pay | Admitting: *Deleted

## 2012-10-31 ENCOUNTER — Other Ambulatory Visit: Payer: Self-pay | Admitting: Family Medicine

## 2012-10-31 DIAGNOSIS — Z0000 Encounter for general adult medical examination without abnormal findings: Secondary | ICD-10-CM

## 2012-10-31 DIAGNOSIS — E7800 Pure hypercholesterolemia, unspecified: Secondary | ICD-10-CM

## 2012-11-08 ENCOUNTER — Other Ambulatory Visit (INDEPENDENT_AMBULATORY_CARE_PROVIDER_SITE_OTHER): Payer: Medicare Other

## 2012-11-08 ENCOUNTER — Telehealth: Payer: Self-pay | Admitting: Family Medicine

## 2012-11-08 DIAGNOSIS — Z0000 Encounter for general adult medical examination without abnormal findings: Secondary | ICD-10-CM

## 2012-11-08 DIAGNOSIS — E7800 Pure hypercholesterolemia, unspecified: Secondary | ICD-10-CM

## 2012-11-08 DIAGNOSIS — Z0279 Encounter for issue of other medical certificate: Secondary | ICD-10-CM

## 2012-11-08 LAB — COMPREHENSIVE METABOLIC PANEL
ALT: 17 U/L (ref 0–35)
AST: 20 U/L (ref 0–37)
Albumin: 4.2 g/dL (ref 3.5–5.2)
Alkaline Phosphatase: 65 U/L (ref 39–117)
Calcium: 9.1 mg/dL (ref 8.4–10.5)
Chloride: 106 mEq/L (ref 96–112)
GFR: 76.01 mL/min (ref >60.00)
Glucose, Bld: 99 mg/dL (ref 70–99)
Potassium: 4.1 mEq/L (ref 3.5–5.1)
Sodium: 142 mEq/L (ref 135–145)
Total Protein: 7.1 g/dL (ref 6.0–8.3)

## 2012-11-08 LAB — LIPID PANEL
Cholesterol: 182 mg/dL (ref 0–200)
LDL Cholesterol: 99 mg/dL (ref 0–99)
Total CHOL/HDL Ratio: 3
Triglycerides: 90 mg/dL (ref 0.0–149.0)

## 2012-11-08 LAB — CBC WITH DIFFERENTIAL/PLATELET
Basophils Absolute: 0 10*3/uL (ref 0.0–0.1)
Eosinophils Absolute: 0.1 10*3/uL (ref 0.0–0.7)
Eosinophils Relative: 2.4 % (ref 0.0–5.0)
Hemoglobin: 12.7 g/dL (ref 12.0–15.0)
Lymphocytes Relative: 27.1 % (ref 12.0–46.0)
MCHC: 34.3 g/dL (ref 30.0–36.0)
Monocytes Relative: 9.7 % (ref 3.0–12.0)
Neutro Abs: 3.3 10*3/uL (ref 1.4–7.7)
RBC: 4.08 Mil/uL (ref 3.87–5.11)
RDW: 12.7 % (ref 11.5–14.6)
WBC: 5.5 10*3/uL (ref 4.5–10.5)

## 2012-11-08 NOTE — Telephone Encounter (Signed)
Form placed in your inbox

## 2012-11-08 NOTE — Telephone Encounter (Signed)
Form signed in my box. 

## 2012-11-08 NOTE — Telephone Encounter (Signed)
Pt dropped off a medical clearance form for [facility] for Dr. Dayton Martes to sign. I have placed the form on Dr. Elmer Sow CMA's desk. Thank you.

## 2012-11-15 ENCOUNTER — Encounter: Payer: Medicare Other | Admitting: Family Medicine

## 2012-12-31 ENCOUNTER — Encounter: Payer: Self-pay | Admitting: Internal Medicine

## 2012-12-31 ENCOUNTER — Ambulatory Visit (INDEPENDENT_AMBULATORY_CARE_PROVIDER_SITE_OTHER): Payer: Medicare Other | Admitting: Internal Medicine

## 2012-12-31 VITALS — BP 124/78 | HR 64 | Temp 98.5°F | Ht 67.75 in | Wt 176.8 lb

## 2012-12-31 DIAGNOSIS — E7800 Pure hypercholesterolemia, unspecified: Secondary | ICD-10-CM

## 2012-12-31 DIAGNOSIS — Z0000 Encounter for general adult medical examination without abnormal findings: Secondary | ICD-10-CM

## 2012-12-31 DIAGNOSIS — Z1239 Encounter for other screening for malignant neoplasm of breast: Secondary | ICD-10-CM

## 2012-12-31 MED ORDER — SIMVASTATIN 20 MG PO TABS: 20.0000 mg | ORAL_TABLET | Freq: Every day | ORAL | Status: DC

## 2012-12-31 NOTE — Progress Notes (Signed)
Pre-visit discussion using our clinic review tool. No additional management support is needed unless otherwise documented below in the visit note.  

## 2012-12-31 NOTE — Patient Instructions (Signed)

## 2012-12-31 NOTE — Progress Notes (Signed)
HPI:  Pt presents to the clinic today for her annual medicare wellness visit.  History reviewed. No pertinent past medical history.  Current Outpatient Prescriptions  Medication Sig Dispense Refill  . ibuprofen (ADVIL,MOTRIN) 200 MG tablet Use as directed       . Multiple Vitamin (MULTIVITAMIN) tablet Take 1 tablet by mouth daily.        . simvastatin (ZOCOR) 20 MG tablet Take 1 tablet (20 mg total) by mouth at bedtime.  90 tablet  3  . ciclopirox (PENLAC) 8 % solution Apply topically at bedtime. Apply over nail and surrounding skin. Apply daily over previous coat. After seven (7) days, may remove with alcohol and continue cycle.  6.6 mL  0  . terbinafine (LAMISIL) 250 MG tablet Take 1 tablet (250 mg total) by mouth daily.  30 tablet  0  . zolpidem (AMBIEN) 5 MG tablet Take 5 mg by mouth at bedtime as needed.         No current facility-administered medications for this visit.    Allergies  Allergen Reactions  . Ezetimibe-Simvastatin     REACTION: Achey  . Hydrocodone     REACTION: Itchy, rash  . Prednisone     REACTION: anxiety, hyper    Family History  Problem Relation Age of Onset  . Heart disease Mother   . Dementia Mother   . Cancer Father     Pancreatic  . Stroke Father     History   Social History  . Marital Status: Married    Spouse Name: N/A    Number of Children: 1  . Years of Education: N/A   Occupational History  . Not on file.   Social History Main Topics  . Smoking status: Never Smoker   . Smokeless tobacco: Not on file  . Alcohol Use: Not on file  . Drug Use: Not on file  . Sexual Activity: Not on file   Other Topics Concern  . Not on file   Social History Narrative  . No narrative on file    Hospitiliaztions: None  Health Maintenance:    Flu: 10/11/2012  Tetanus: 2007  Pneumovax: 2012  Zostavax: 2009  Bone Density: 2012  Colonoscopy: never  Eye Doctor: 07/2011  Dental Exam: biannually  Pap Smear: 2013  Mammogram:  12/2011   History reviewed. No pertinent past medical history.  Current Outpatient Prescriptions  Medication Sig Dispense Refill  . ibuprofen (ADVIL,MOTRIN) 200 MG tablet Use as directed       . Multiple Vitamin (MULTIVITAMIN) tablet Take 1 tablet by mouth daily.        . simvastatin (ZOCOR) 20 MG tablet Take 1 tablet (20 mg total) by mouth at bedtime.  90 tablet  3  . ciclopirox (PENLAC) 8 % solution Apply topically at bedtime. Apply over nail and surrounding skin. Apply daily over previous coat. After seven (7) days, may remove with alcohol and continue cycle.  6.6 mL  0  . terbinafine (LAMISIL) 250 MG tablet Take 1 tablet (250 mg total) by mouth daily.  30 tablet  0  . zolpidem (AMBIEN) 5 MG tablet Take 5 mg by mouth at bedtime as needed.         No current facility-administered medications for this visit.    Allergies  Allergen Reactions  . Ezetimibe-Simvastatin     REACTION: Achey  . Hydrocodone     REACTION: Itchy, rash  . Prednisone     REACTION: anxiety, hyper    Family History  Problem Relation Age of Onset  . Heart disease Mother   . Dementia Mother   . Cancer Father     Pancreatic  . Stroke Father     History   Social History  . Marital Status: Married    Spouse Name: N/A    Number of Children: 1  . Years of Education: N/A   Occupational History  . Not on file.   Social History Main Topics  . Smoking status: Never Smoker   . Smokeless tobacco: Not on file  . Alcohol Use: Not on file  . Drug Use: Not on file  . Sexual Activity: Not on file   Other Topics Concern  . Not on file   Social History Narrative  . No narrative on file    I have personally reviewed and have noted:  1. The patient's medical and social history 2. Their use of alcohol, tobacco or illicit drugs 3. Their current medications and supplements 4. The patient's functional ability including ADL's, fall risks, home safety risks and hearing or visual impairment. 5. Diet and  physical activities 6. Evidence for depression or mood disorders  Subjective:   Review of Systems:   Constitutional: Denies fever, malaise, fatigue, headache or abrupt weight changes.  HEENT: Denies eye pain, eye redness, ear pain, ringing in the ears, wax buildup, runny nose, nasal congestion, bloody nose, or sore throat. Respiratory: Denies difficulty breathing, shortness of breath, cough or sputum production.   Cardiovascular: Denies chest pain, chest tightness, palpitations or swelling in the hands or feet.  Gastrointestinal: Denies abdominal pain, bloating, constipation, diarrhea or blood in the stool.  GU: Denies urgency, frequency, pain with urination, burning sensation, blood in urine, odor or discharge. Musculoskeletal: Denies decrease in range of motion, difficulty with gait, muscle pain or joint pain and swelling.  Skin: Denies redness, rashes, lesions or ulcercations.  Neurological: Denies dizziness, difficulty with memory, difficulty with speech or problems with balance and coordination.   No other specific complaints in a complete review of systems (except as listed in HPI above).  Objective:  PE:   BP 124/78  Pulse 64  Temp(Src) 98.5 F (36.9 C) (Oral)  Ht 5' 7.75" (1.721 m)  Wt 176 lb 12 oz (80.173 kg)  BMI 27.07 kg/m2  SpO2 99% Wt Readings from Last 3 Encounters:  12/31/12 176 lb 12 oz (80.173 kg)  11/14/11 174 lb (78.926 kg)  11/11/10 164 lb 4 oz (74.503 kg)     BMET    Component Value Date/Time   NA 142 11/08/2012 0803   K 4.1 11/08/2012 0803   CL 106 11/08/2012 0803   CO2 27 11/08/2012 0803   GLUCOSE 99 11/08/2012 0803   BUN 30* 11/08/2012 0803   CREATININE 0.8 11/08/2012 0803   CALCIUM 9.1 11/08/2012 0803   GFRNONAA 74.56 11/09/2009 0810   GFRAA 93 11/02/2007 0939    Lipid Panel     Component Value Date/Time   CHOL 182 11/08/2012 0803   TRIG 90.0 11/08/2012 0803   HDL 64.90 11/08/2012 0803   CHOLHDL 3 11/08/2012 0803   VLDL 18.0  11/08/2012 0803   LDLCALC 99 11/08/2012 0803    CBC    Component Value Date/Time   WBC 5.5 11/08/2012 0803   RBC 4.08 11/08/2012 0803   HGB 12.7 11/08/2012 0803   HCT 36.8 11/08/2012 0803   PLT 250.0 11/08/2012 0803   MCV 90.3 11/08/2012 0803   MCHC 34.3 11/08/2012 0803   RDW 12.7 11/08/2012 0803  LYMPHSABS 1.5 11/08/2012 0803   MONOABS 0.5 11/08/2012 0803   EOSABS 0.1 11/08/2012 0803   BASOSABS 0.0 11/08/2012 0803    Hgb A1C No results found for this basename: HGBA1C      Assessment and Plan:   Medicare Annual Wellness Visit:  Diet: Heart healthy Physical activity: Tennis 3-4 times a week Depression/mood screen: Negative Hearing: Intact to whispered voice Visual acuity: Grossly normal, performs annual eye exam  ADLs: Capable Fall risk: None Home safety: Good Cognitive evaluation: Intact to orientation, naming, recall and repetition EOL planning: Adv directives, full code/ I agree  Preventative Medicine:   Will schedule mammogram for this year All other HM UTD  Next appointment:  1 year

## 2013-01-15 ENCOUNTER — Ambulatory Visit: Payer: Self-pay | Admitting: Internal Medicine

## 2013-01-16 ENCOUNTER — Encounter: Payer: Self-pay | Admitting: Family Medicine

## 2013-09-20 ENCOUNTER — Ambulatory Visit (INDEPENDENT_AMBULATORY_CARE_PROVIDER_SITE_OTHER): Payer: Medicare Other | Admitting: Podiatry

## 2013-09-20 ENCOUNTER — Encounter: Payer: Self-pay | Admitting: Podiatry

## 2013-09-20 VITALS — BP 121/73 | HR 72 | Resp 16 | Ht 68.0 in | Wt 175.0 lb

## 2013-09-20 DIAGNOSIS — M79609 Pain in unspecified limb: Secondary | ICD-10-CM

## 2013-09-20 DIAGNOSIS — L600 Ingrowing nail: Secondary | ICD-10-CM

## 2013-09-20 NOTE — Progress Notes (Signed)
Subjective:     Patient ID: Natalie Sosa, female   DOB: 1945/02/16, 68 y.o.   MRN: [id number]  HPI patient presents with a damaged right hallux nail that is loose and painful when pressed and some thickness to the left hallux nail but currently is not loose   Review of Systems  All other systems reviewed and are negative.      Objective:   Physical Exam  Nursing note and vitals reviewed. Constitutional: She is oriented to person, place, and time.  Cardiovascular: Intact distal pulses.   Musculoskeletal: Normal range of motion.  Neurological: She is oriented to person, place, and time.  Skin: Skin is warm and dry.   neurovascular status intact with damaged right hallux nail that is loose and painful when pressed and some irritation to the left hallux nail but is not significantly loose. Patient is noted to have good muscle strength and range of motion subtalar joint midtarsal joint is within normal limits. Patient is well oriented x3 and has good digital perfusion     Assessment:     Damage right hallux nail but also loose and painful and some damage to the left hallux nail    Plan:     H&P and condition reviewed. I have recommended removal of the hallux nail right and application of chemical and patient wants this done understanding risk I infiltrated 60 mg Xylocaine Marcaine mixture and remove the hallux nail exposing matrix and applying phenol or applications 30 seconds followed by alcohol lavaged and sterile dressing. Instructed on soaks

## 2013-09-20 NOTE — Progress Notes (Signed)
   Subjective:    Patient ID: Natalie Sosa, female    DOB: 1945-06-02, 68 y.o.   MRN: [id number]  HPI Comments: i have a toenail that is hanging off my big toe on my rt foot and my big toenail is loose on my left foot. Its been like this since last Friday. The rt one hurts and the left one does not. Its getting worse. i dont do anything for my nails.     Review of Systems  Skin:       Change in nails  All other systems reviewed and are negative.      Objective:   Physical Exam        Assessment & Plan:

## 2013-09-20 NOTE — Patient Instructions (Signed)

## 2013-12-18 ENCOUNTER — Other Ambulatory Visit: Payer: Self-pay | Admitting: Family Medicine

## 2013-12-18 DIAGNOSIS — Z0000 Encounter for general adult medical examination without abnormal findings: Secondary | ICD-10-CM

## 2013-12-18 DIAGNOSIS — E7800 Pure hypercholesterolemia, unspecified: Secondary | ICD-10-CM

## 2013-12-25 ENCOUNTER — Other Ambulatory Visit (INDEPENDENT_AMBULATORY_CARE_PROVIDER_SITE_OTHER): Payer: Medicare Other

## 2013-12-25 DIAGNOSIS — Z0000 Encounter for general adult medical examination without abnormal findings: Secondary | ICD-10-CM

## 2013-12-25 DIAGNOSIS — E7800 Pure hypercholesterolemia, unspecified: Secondary | ICD-10-CM

## 2013-12-25 LAB — CBC WITH DIFFERENTIAL/PLATELET
BASOS ABS: 0 10*3/uL (ref 0.0–0.1)
BASOS PCT: 0.9 % (ref 0.0–3.0)
EOS ABS: 0.1 10*3/uL (ref 0.0–0.7)
Eosinophils Relative: 1.3 % (ref 0.0–5.0)
HCT: 39.3 % (ref 36.0–46.0)
Hemoglobin: 13.3 g/dL (ref 12.0–15.0)
LYMPHS PCT: 30.3 % (ref 12.0–46.0)
Lymphs Abs: 1.6 10*3/uL (ref 0.7–4.0)
MCHC: 33.8 g/dL (ref 30.0–36.0)
MCV: 91.4 fl (ref 78.0–100.0)
MONO ABS: 0.6 10*3/uL (ref 0.1–1.0)
Monocytes Relative: 11.9 % (ref 3.0–12.0)
NEUTROS PCT: 55.6 % (ref 43.0–77.0)
Neutro Abs: 2.9 10*3/uL (ref 1.4–7.7)
PLATELETS: 236 10*3/uL (ref 150.0–400.0)
RBC: 4.3 Mil/uL (ref 3.87–5.11)
RDW: 12.2 % (ref 11.5–15.5)
WBC: 5.3 10*3/uL (ref 4.0–10.5)

## 2013-12-25 LAB — COMPREHENSIVE METABOLIC PANEL
ALT: 14 U/L (ref 0–35)
AST: 25 U/L (ref 0–37)
Albumin: 4.2 g/dL (ref 3.5–5.2)
Alkaline Phosphatase: 66 U/L (ref 39–117)
BUN: 22 mg/dL (ref 6–23)
CO2: 23 meq/L (ref 19–32)
CREATININE: 0.8 mg/dL (ref 0.4–1.2)
Calcium: 9 mg/dL (ref 8.4–10.5)
Chloride: 106 mEq/L (ref 96–112)
GFR: 79.17 mL/min (ref >60.00)
Glucose, Bld: 95 mg/dL (ref 70–99)
Potassium: 4.2 mEq/L (ref 3.5–5.1)
Sodium: 137 mEq/L (ref 135–145)
Total Bilirubin: 0.8 mg/dL (ref 0.2–1.2)
Total Protein: 6.6 g/dL (ref 6.0–8.3)

## 2013-12-25 LAB — LIPID PANEL
Cholesterol: 176 mg/dL (ref 0–200)
HDL: 51.1 mg/dL (ref >39.00)
LDL Cholesterol: 112 mg/dL — ABNORMAL HIGH (ref 0–99)
NonHDL: 124.9
Total CHOL/HDL Ratio: 3
Triglycerides: 67 mg/dL (ref 0.0–149.0)
VLDL: 13.4 mg/dL (ref 0.0–40.0)

## 2013-12-25 LAB — TSH: TSH: 1.4 u[IU]/mL (ref 0.35–4.50)

## 2014-01-01 ENCOUNTER — Encounter: Payer: Self-pay | Admitting: Family Medicine

## 2014-01-01 ENCOUNTER — Ambulatory Visit (INDEPENDENT_AMBULATORY_CARE_PROVIDER_SITE_OTHER): Payer: Medicare Other | Admitting: Family Medicine

## 2014-01-01 VITALS — BP 122/72 | HR 71 | Temp 98.1°F | Ht 67.5 in | Wt 176.2 lb

## 2014-01-01 DIAGNOSIS — E7800 Pure hypercholesterolemia, unspecified: Secondary | ICD-10-CM

## 2014-01-01 DIAGNOSIS — Z1211 Encounter for screening for malignant neoplasm of colon: Secondary | ICD-10-CM

## 2014-01-01 DIAGNOSIS — Z0000 Encounter for general adult medical examination without abnormal findings: Secondary | ICD-10-CM | POA: Insufficient documentation

## 2014-01-01 DIAGNOSIS — Z23 Encounter for immunization: Secondary | ICD-10-CM

## 2014-01-01 DIAGNOSIS — G4700 Insomnia, unspecified: Secondary | ICD-10-CM

## 2014-01-01 MED ORDER — SIMVASTATIN 20 MG PO TABS: 20.0000 mg | ORAL_TABLET | Freq: Every day | ORAL | Status: DC

## 2014-01-01 MED ORDER — TRAZODONE HCL 50 MG PO TABS: 25.0000 mg | ORAL_TABLET | Freq: Every evening | ORAL | Status: DC | PRN

## 2014-01-01 NOTE — Assessment & Plan Note (Signed)
Deteriorated. .  The problem of recurrent insomnia is discussed. Avoidance of caffeine sources is strongly encouraged. Sleep hygiene issues are reviewed.  Start Trazodone 25-50 mg nightly prn insomnia. Continue prn benadryl if ineffective.

## 2014-01-01 NOTE — Addendum Note (Signed)
Addended by: Ellamae Sia on: 01/01/2014 11:04 AM   Modules accepted: Orders

## 2014-01-01 NOTE — Assessment & Plan Note (Signed)
The patients weight, height, BMI and visual acuity have been recorded in the chart I have made referrals, counseling and provided education to the patient based review of the above and I have provided the pt with a written personalized care plan for preventive services.  Prevnar 13 given today.  Declines colonoscopy but willing to do stool cards- order entered.

## 2014-01-01 NOTE — Assessment & Plan Note (Signed)
Stable on current rx. No changes made today. 

## 2014-01-01 NOTE — Progress Notes (Signed)
Subjective:    Patient ID: Natalie Sosa, female    DOB: [birth year], 68 y.o.   MRN: [id number]  HPI 68 yo pleasant female here for Welcome to Medicare Physical.  I have personally reviewed the Medicare Annual Wellness questionnaire and have noted 1. The patient's medical and social history 2. Their use of alcohol, tobacco or illicit drugs 3. Their current medications and supplements 4. The patient's functional ability including ADL's, fall risks, home safety risks and hearing or visual             impairment. 5. Diet and physical activities 6. Evidence for depression or mood disorders  End of life wishes discussed and updated in Social History.  The roster of all physicians providing medical care to patient - is listed in the Snapshot section of the chart.  Flu vaccine 10/07/12 at [facility] Pneumovax 11/11/10 Zoster 03/20/07 Td 10/13/05 Never had a colonoscopy but did complete IFOB in 2013 which was negative. Mammogram 01/15/13 Pap smear UTD- no h/o post menopausal bleeding. Eye exam - 08/27/13  Insomnia- persistent despite using Benadryl.  Melatonin was less effective than benadryl- still gets some sleep with benadryl but sleeping through the night.  Does have some daytime sleepiness. Sleep hygeine has not been helpful. Denies feeling anxious or depressed.  HLD- has been on Crestor 10 mg for years. NO myalgias or other complaints.  Very physically active, plays competite tennis several times per week.  Lab Results  Component Value Date   CHOL 176 12/25/2013   HDL 51.10 12/25/2013   LDLCALC 112* 12/25/2013   TRIG 67.0 12/25/2013   CHOLHDL 3 12/25/2013    Lab Results  Component Value Date   ALT 14 12/25/2013   AST 25 12/25/2013   ALKPHOS 66 12/25/2013   BILITOT 0.8 12/25/2013   Lab Results  Component Value Date   CREATININE 0.8 12/25/2013   Lab Results  Component Value Date   WBC 5.3 12/25/2013   HGB 13.3 12/25/2013   HCT 39.3 12/25/2013   MCV 91.4 12/25/2013   PLT  236.0 12/25/2013   Lab Results  Component Value Date   TSH 1.40 12/25/2013       Patient Active Problem List   Diagnosis Date Noted  . Routine general medical examination at a health care facility 11/11/2010  . TRANSIENT DISORDER INITIATING/MAINTAINING SLEEP 11/09/2009  . RUQ PAIN 05/26/2008  . HYPERCHOLESTEROLEMIA 10/04/2006  . ENDOMETRIOSIS 10/04/2006  . PERIMENOPAUSAL STATUS 10/04/2006   No past medical history on file. Past Surgical History  Procedure Laterality Date  . Cesarean section      placenta previa  . Cholecystectomy  06/13/2001  . Achilles tendon repair  06/14/2004   History  Substance Use Topics  . Smoking status: Never Smoker   . Smokeless tobacco: Not on file  . Alcohol Use: Yes   Family History  Problem Relation Age of Onset  . Heart disease Mother   . Dementia Mother   . Cancer Father     Pancreatic  . Stroke Father    Allergies  Allergen Reactions  . Ezetimibe-Simvastatin     REACTION: Achey  . Hydrocodone     REACTION: Itchy, rash  . Prednisone     REACTION: anxiety, hyper   Current Outpatient Prescriptions on File Prior to Visit  Medication Sig Dispense Refill  . ibuprofen (ADVIL,MOTRIN) 200 MG tablet Use as directed     . Multiple Vitamin (MULTIVITAMIN) tablet Take 1 tablet by mouth daily.  No current facility-administered medications on file prior to visit.   The PMH, PSH, Social History, Family History, Medications, and allergies have been reviewed in [facility], and have been updated if relevant.    Review of Systems See HPI No CP or SOB No changes in bowel habits or blood in stool Appetite good- weight stable Wt Readings from Last 3 Encounters:  01/01/14 176 lb 4 oz (79.946 kg)  09/20/13 175 lb (79.379 kg)  12/31/12 176 lb 12 oz (80.173 kg)   No HA No blurred vision No rashes + heel pain- being treated for heel spurs (Dr. Paulla Dolly).     Objective:   Physical Exam BP 122/72 mmHg  Pulse 71  Temp(Src) 98.1 F (36.7  C) (Oral)  Ht 5' 7.5" (1.715 m)  Wt 176 lb 4 oz (79.946 kg)  BMI 27.18 kg/m2  SpO2 96%  General:  Well-developed,well-nourished,in no acute distress; alert,appropriate and cooperative throughout examination Head:  normocephalic and atraumatic.   Eyes:  vision grossly intact, pupils equal, pupils round, and pupils reactive to light.   Ears:  R ear normal and L ear normal.   Nose:  no external deformity.   Mouth:  good dentition.   Lungs:  Normal respiratory effort, chest expands symmetrically. Lungs are clear to auscultation, no crackles or wheezes. Heart:  Normal rate and regular rhythm. S1 and S2 normal without gallop, murmur, click, rub or other extra sounds. Abdomen:  Bowel sounds positive,abdomen soft and non-tender without masses, organomegaly or hernias noted. Msk:  No deformity or scoliosis noted of thoracic or lumbar spine.   Extremities:  No clubbing, cyanosis, edema, or deformity noted with normal full range of motion of all joints.  Neurologic:  alert & oriented X3 and gait normal.   Skin:  Intact without suspicious lesions or rashes Psych:  Cognition and judgment appear intact. Alert and cooperative with normal attention span and concentration. No apparent delusions, illusions, hallucinations     Assessment & Plan:

## 2014-01-01 NOTE — Patient Instructions (Addendum)
Calcium supplements have received some bad press lately, with questions that they may increase risk of heart attack or blood clots.  The risk is very low, however none of these risks occur with calcium in FOOD. Try to get most or all of your calcium from your food--aim for 1000 mg/day for women up to 1 and men up to 70 and 1200 mg/day for women over 32 and men over 70.  To figure out dietary calcium: 300 mg/day from all non dairy foods plus 300 mg per cup of milk, other dairy, or fortified juice.  Non dairy foods that contain calcium:  Kale, oranges, sardines, oatmeal, soy milk/soybeans, salmon, white beans, dried figs, turnip greens, almonds, broccoli, tofu.  We are starting Trazodone 25-50 mg as needed at bedtime.  Let me know how it's working a few weeks.  Have a very happy holiday season!

## 2014-01-01 NOTE — Progress Notes (Signed)
Pre visit review using our clinic review tool, if applicable. No additional management support is needed unless otherwise documented below in the visit note. 

## 2014-01-06 ENCOUNTER — Other Ambulatory Visit (INDEPENDENT_AMBULATORY_CARE_PROVIDER_SITE_OTHER): Payer: Medicare Other

## 2014-01-06 DIAGNOSIS — Z1211 Encounter for screening for malignant neoplasm of colon: Secondary | ICD-10-CM

## 2014-01-07 ENCOUNTER — Encounter: Payer: Self-pay | Admitting: *Deleted

## 2014-01-07 LAB — FECAL OCCULT BLOOD, IMMUNOCHEMICAL: Fecal Occult Bld: NEGATIVE

## 2014-01-20 ENCOUNTER — Ambulatory Visit: Payer: Self-pay | Admitting: Family Medicine

## 2014-01-21 ENCOUNTER — Encounter: Payer: Self-pay | Admitting: Family Medicine

## 2014-06-25 ENCOUNTER — Other Ambulatory Visit: Payer: Self-pay | Admitting: Family Medicine

## 2015-01-05 ENCOUNTER — Other Ambulatory Visit: Payer: Self-pay | Admitting: Family Medicine

## 2015-01-05 ENCOUNTER — Other Ambulatory Visit (INDEPENDENT_AMBULATORY_CARE_PROVIDER_SITE_OTHER): Payer: Medicare Other

## 2015-01-05 DIAGNOSIS — Z0000 Encounter for general adult medical examination without abnormal findings: Secondary | ICD-10-CM

## 2015-01-05 DIAGNOSIS — E7800 Pure hypercholesterolemia, unspecified: Secondary | ICD-10-CM

## 2015-01-05 DIAGNOSIS — Z1231 Encounter for screening mammogram for malignant neoplasm of breast: Secondary | ICD-10-CM

## 2015-01-05 LAB — COMPREHENSIVE METABOLIC PANEL
ALBUMIN: 4.4 g/dL (ref 3.5–5.2)
ALT: 18 U/L (ref 0–35)
AST: 22 U/L (ref 0–37)
Alkaline Phosphatase: 68 U/L (ref 39–117)
BILIRUBIN TOTAL: 0.5 mg/dL (ref 0.2–1.2)
BUN: 18 mg/dL (ref 6–23)
CALCIUM: 9.7 mg/dL (ref 8.4–10.5)
CHLORIDE: 105 meq/L (ref 96–112)
CO2: 29 mEq/L (ref 19–32)
CREATININE: 0.91 mg/dL (ref 0.40–1.20)
GFR: 65.09 mL/min (ref >60.00)
Glucose, Bld: 89 mg/dL (ref 70–99)
Potassium: 4.2 mEq/L (ref 3.5–5.1)
Sodium: 142 mEq/L (ref 135–145)
Total Protein: 6.8 g/dL (ref 6.0–8.3)

## 2015-01-05 LAB — CBC WITH DIFFERENTIAL/PLATELET
BASOS PCT: 0.7 % (ref 0.0–3.0)
Basophils Absolute: 0 10*3/uL (ref 0.0–0.1)
EOS ABS: 0.1 10*3/uL (ref 0.0–0.7)
Eosinophils Relative: 2.1 % (ref 0.0–5.0)
HEMATOCRIT: 43.6 % (ref 36.0–46.0)
HEMOGLOBIN: 14.4 g/dL (ref 12.0–15.0)
LYMPHS PCT: 32.3 % (ref 12.0–46.0)
Lymphs Abs: 1.7 10*3/uL (ref 0.7–4.0)
MCHC: 33.1 g/dL (ref 30.0–36.0)
MCV: 91.7 fl (ref 78.0–100.0)
MONOS PCT: 9.5 % (ref 3.0–12.0)
Monocytes Absolute: 0.5 10*3/uL (ref 0.1–1.0)
NEUTROS ABS: 3 10*3/uL (ref 1.4–7.7)
Neutrophils Relative %: 55.4 % (ref 43.0–77.0)
PLATELETS: 274 10*3/uL (ref 150.0–400.0)
RBC: 4.76 Mil/uL (ref 3.87–5.11)
RDW: 12.4 % (ref 11.5–15.5)
WBC: 5.4 10*3/uL (ref 4.0–10.5)

## 2015-01-05 LAB — LIPID PANEL
CHOLESTEROL: 177 mg/dL (ref 0–200)
HDL: 61.9 mg/dL (ref >39.00)
LDL Cholesterol: 95 mg/dL (ref 0–99)
NonHDL: 114.9
TRIGLYCERIDES: 101 mg/dL (ref 0.0–149.0)
Total CHOL/HDL Ratio: 3
VLDL: 20.2 mg/dL (ref 0.0–40.0)

## 2015-01-05 LAB — TSH: TSH: 1.85 u[IU]/mL (ref 0.35–4.50)

## 2015-01-08 ENCOUNTER — Encounter: Payer: Self-pay | Admitting: Family Medicine

## 2015-01-08 ENCOUNTER — Ambulatory Visit (INDEPENDENT_AMBULATORY_CARE_PROVIDER_SITE_OTHER): Payer: Medicare Other | Admitting: Family Medicine

## 2015-01-08 VITALS — BP 122/62 | HR 67 | Temp 98.4°F | Ht 67.5 in | Wt 174.5 lb

## 2015-01-08 DIAGNOSIS — Z1211 Encounter for screening for malignant neoplasm of colon: Secondary | ICD-10-CM

## 2015-01-08 DIAGNOSIS — G4700 Insomnia, unspecified: Secondary | ICD-10-CM | POA: Diagnosis not present

## 2015-01-08 DIAGNOSIS — Z0000 Encounter for general adult medical examination without abnormal findings: Secondary | ICD-10-CM | POA: Diagnosis not present

## 2015-01-08 DIAGNOSIS — E7800 Pure hypercholesterolemia, unspecified: Secondary | ICD-10-CM | POA: Diagnosis not present

## 2015-01-08 DIAGNOSIS — Z01419 Encounter for gynecological examination (general) (routine) without abnormal findings: Secondary | ICD-10-CM

## 2015-01-08 NOTE — Assessment & Plan Note (Signed)
Well controlled on current rxs. No changes made today. 

## 2015-01-08 NOTE — Assessment & Plan Note (Addendum)
The patients weight, height, BMI and visual acuity have been recorded in the chart.  Cognitive function assessed.   I have made referrals, counseling and provided education to the patient based review of the above and I have provided the pt with a written personalized care plan for preventive services.  BME and breast exam today.

## 2015-01-08 NOTE — Patient Instructions (Signed)
Great to see you. Happy Holidays.   

## 2015-01-08 NOTE — Progress Notes (Signed)
Pre visit review using our clinic review tool, if applicable. No additional management support is needed unless otherwise documented below in the visit note. 

## 2015-01-08 NOTE — Assessment & Plan Note (Signed)
Improved control with OTC rxs. No changes made today.

## 2015-01-08 NOTE — Progress Notes (Signed)
Subjective:    Patient ID: Natalie Sosa, female    DOB: Jun 11, 1945, 69 y.o.   MRN: [id number]  HPI 69 yo pleasant female here for medicare wellness visit and follow up of chronic medical conditions.  I have personally reviewed the Medicare Annual Wellness questionnaire and have noted 1. The patient's medical and social history 2. Their use of alcohol, tobacco or illicit drugs 3. Their current medications and supplements 4. The patient's functional ability including ADL's, fall risks, home safety risks and hearing or visual             impairment. 5. Diet and physical activities 6. Evidence for depression or mood disorders  End of life wishes discussed and updated in Social History.  The roster of all physicians providing medical care to patient - is listed in the Snapshot section of the chart.  Flu vaccine  at [facility] 11/15/14 Pneumovax 11/11/10 prevnar 13 01/01/14 Zoster 03/20/07 Td 10/13/05 Never had a colonoscopy but did complete IFOB in 01/06/14 which was negative. Mammogram scheduled for 01/22/15 Pap smear UTD- no h/o post menopausal bleeding. Eye exam -12/2013  Insomnia-started Trazodone last year as she failed OTC antihistamines and melatonin.  Stopped taking the Trazodone, felt benadryl was working ok.  HLD- has been on Crestor 10 mg for years. NO myalgias or other complaints.  Very physically active, plays competite tennis several times per week.  Lab Results  Component Value Date   CHOL 177 01/05/2015   HDL 61.90 01/05/2015   LDLCALC 95 01/05/2015   TRIG 101.0 01/05/2015   CHOLHDL 3 01/05/2015    Lab Results  Component Value Date   ALT 18 01/05/2015   AST 22 01/05/2015   ALKPHOS 68 01/05/2015   BILITOT 0.5 01/05/2015   Lab Results  Component Value Date   CREATININE 0.91 01/05/2015   Lab Results  Component Value Date   WBC 5.4 01/05/2015   HGB 14.4 01/05/2015   HCT 43.6 01/05/2015   MCV 91.7 01/05/2015   PLT 274.0 01/05/2015   Lab Results  Component  Value Date   TSH 1.85 01/05/2015       Patient Active Problem List   Diagnosis Date Noted  . Insomnia 01/01/2014  . Medicare annual wellness visit, subsequent 01/01/2014  . HYPERCHOLESTEROLEMIA 10/04/2006  . ENDOMETRIOSIS 10/04/2006   No past medical history on file. Past Surgical History  Procedure Laterality Date  . Cesarean section      placenta previa  . Cholecystectomy  06/13/2001  . Achilles tendon repair  06/14/2004   Social History  Substance Use Topics  . Smoking status: Never Smoker   . Smokeless tobacco: None  . Alcohol Use: Yes   Family History  Problem Relation Age of Onset  . Heart disease Mother   . Dementia Mother   . Cancer Father     Pancreatic  . Stroke Father    Allergies  Allergen Reactions  . Ezetimibe-Simvastatin     REACTION: Achey  . Hydrocodone     REACTION: Itchy, rash  . Prednisone     REACTION: anxiety, hyper   Current Outpatient Prescriptions on File Prior to Visit  Medication Sig Dispense Refill  . ibuprofen (ADVIL,MOTRIN) 200 MG tablet Use as directed     . Multiple Vitamin (MULTIVITAMIN) tablet Take 1 tablet by mouth daily.      . simvastatin (ZOCOR) 20 MG tablet TAKE 1 BY MOUTH AT BEDTIME 90 tablet 3   No current facility-administered medications on file prior to visit.  The PMH, PSH, Social History, Family History, Medications, and allergies have been reviewed in [facility], and have been updated if relevant.    Review of Systems Review of Systems  Constitutional: Negative.   HENT: Negative.   Eyes: Negative.   Respiratory: Negative.   Cardiovascular: Negative.   Gastrointestinal: Negative.   Endocrine: Negative.   Genitourinary: Negative.   Musculoskeletal: Negative.   Skin: Negative.   Allergic/Immunologic: Negative.   Neurological: Negative.   Hematological: Negative.   Psychiatric/Behavioral: Negative.   All other systems reviewed and are negative.       Objective:   Physical Exam BP 122/62 mmHg  Pulse  67  Temp(Src) 98.4 F (36.9 C) (Oral)  Ht 5' 7.5" (1.715 m)  Wt 174 lb 8 oz (79.153 kg)  BMI 26.91 kg/m2  SpO2 98%   General:  Well-developed,well-nourished,in no acute distress; alert,appropriate and cooperative throughout examination Head:  normocephalic and atraumatic.   Eyes:  vision grossly intact, pupils equal, pupils round, and pupils reactive to light.   Ears:  R ear normal and L ear normal.   Nose:  no external deformity.   Mouth:  good dentition.   Neck:  No deformities, masses, or tenderness noted. Breasts:  No mass, nodules, thickening, tenderness, bulging, retraction, inflamation, nipple discharge or skin changes noted.   Lungs:  Normal respiratory effort, chest expands symmetrically. Lungs are clear to auscultation, no crackles or wheezes. Heart:  Normal rate and regular rhythm. S1 and S2 normal without gallop, murmur, click, rub or other extra sounds. Abdomen:  Bowel sounds positive,abdomen soft and non-tender without masses, organomegaly or hernias noted. Rectal:  no external abnormalities.   Genitalia:  Pelvic Exam:        External: normal female genitalia without lesions or masses        Vagina: normal without lesions or masses        Cervix: normal without lesions or masses        Adnexa: normal bimanual exam without masses or fullness        Uterus: normal by palpation     Msk:  No deformity or scoliosis noted of thoracic or lumbar spine.   Extremities:  No clubbing, cyanosis, edema, or deformity noted with normal full range of motion of all joints.   Neurologic:  alert & oriented X3 and gait normal.   Skin:  Intact without suspicious lesions or rashes Cervical Nodes:  No lymphadenopathy noted Axillary Nodes:  No palpable lymphadenopathy Psych:  Cognition and judgment appear intact. Alert and cooperative with normal attention span and concentration. No apparent delusions, illusions, hallucinations      Assessment & Plan:

## 2015-01-13 ENCOUNTER — Other Ambulatory Visit (INDEPENDENT_AMBULATORY_CARE_PROVIDER_SITE_OTHER): Payer: Medicare Other

## 2015-01-13 DIAGNOSIS — Z1211 Encounter for screening for malignant neoplasm of colon: Secondary | ICD-10-CM

## 2015-01-14 ENCOUNTER — Encounter: Payer: Self-pay | Admitting: *Deleted

## 2015-01-14 LAB — FECAL OCCULT BLOOD, IMMUNOCHEMICAL: Fecal Occult Bld: NEGATIVE

## 2015-01-22 ENCOUNTER — Ambulatory Visit
Admission: RE | Admit: 2015-01-22 | Discharge: 2015-01-22 | Disposition: A | Discharge status: Home / Self Care | Payer: Medicare Other | Source: Ambulatory Visit | Attending: Family Medicine | Admitting: Family Medicine

## 2015-01-22 DIAGNOSIS — Z1231 Encounter for screening mammogram for malignant neoplasm of breast: Secondary | ICD-10-CM | POA: Insufficient documentation

## 2015-03-02 ENCOUNTER — Encounter: Payer: Self-pay | Admitting: Internal Medicine

## 2015-03-02 ENCOUNTER — Ambulatory Visit (INDEPENDENT_AMBULATORY_CARE_PROVIDER_SITE_OTHER): Payer: Medicare Other | Admitting: Internal Medicine

## 2015-03-02 VITALS — BP 128/70 | HR 72 | Temp 98.3°F | Wt 175.0 lb

## 2015-03-02 DIAGNOSIS — R590 Localized enlarged lymph nodes: Secondary | ICD-10-CM

## 2015-03-02 LAB — CBC WITH DIFFERENTIAL/PLATELET
BASOS ABS: 0.1 10*3/uL (ref 0.0–0.1)
Basophils Relative: 1 % (ref 0.0–3.0)
EOS ABS: 0 10*3/uL (ref 0.0–0.7)
Eosinophils Relative: 0.7 % (ref 0.0–5.0)
HCT: 40.7 % (ref 36.0–46.0)
HEMOGLOBIN: 13.6 g/dL (ref 12.0–15.0)
Lymphocytes Relative: 31.3 % (ref 12.0–46.0)
Lymphs Abs: 1.7 10*3/uL (ref 0.7–4.0)
MCHC: 33.5 g/dL (ref 30.0–36.0)
MCV: 91.4 fl (ref 78.0–100.0)
MONO ABS: 0.5 10*3/uL (ref 0.1–1.0)
Monocytes Relative: 9.3 % (ref 3.0–12.0)
Neutro Abs: 3.2 10*3/uL (ref 1.4–7.7)
Neutrophils Relative %: 57.7 % (ref 43.0–77.0)
Platelets: 240 10*3/uL (ref 150.0–400.0)
RBC: 4.45 Mil/uL (ref 3.87–5.11)
RDW: 12.4 % (ref 11.5–15.5)
WBC: 5.5 10*3/uL (ref 4.0–10.5)

## 2015-03-02 NOTE — Progress Notes (Signed)
Subjective:    Patient ID: Natalie Sosa, female    DOB: 1945/11/21, 70 y.o.   MRN: [id number]  HPI  Pt presents to the clinic today with c/o swelling in her neck. She says it has been there for several weeks. She has never felt swelling like this before unless she is sick. She has no history of lymphoma or blood cancer. She denies any other symptoms at all.    Review of Systems  No past medical history on file.  Current Outpatient Prescriptions  Medication Sig Dispense Refill  . ibuprofen (ADVIL,MOTRIN) 200 MG tablet Use as directed     . Multiple Vitamin (MULTIVITAMIN) tablet Take 1 tablet by mouth daily.      . simvastatin (ZOCOR) 20 MG tablet TAKE 1 BY MOUTH AT BEDTIME 90 tablet 3   No current facility-administered medications for this visit.    Allergies  Allergen Reactions  . Ezetimibe-Simvastatin     REACTION: Achey  . Hydrocodone     REACTION: Itchy, rash  . Prednisone     REACTION: anxiety, hyper    Family History  Problem Relation Age of Onset  . Heart disease Mother   . Dementia Mother   . Cancer Father     Pancreatic  . Stroke Father   . Breast cancer Paternal Grandmother 63    Social History   Social History  . Marital Status: Married    Spouse Name: N/A  . Number of Children: 1  . Years of Education: N/A   Occupational History  . Not on file.   Social History Main Topics  . Smoking status: Never Smoker   . Smokeless tobacco: Not on file  . Alcohol Use: Yes  . Drug Use: Not on file  . Sexual Activity: Not on file   Other Topics Concern  . Not on file   Social History Narrative   Husband Gershon Mussel, aware of her end of life wishes.  She would desire CPR, no feedings tubes.     Constitutional: Denies fever, malaise, fatigue, headache or abrupt weight changes.  HEENT: Denies eye pain, eye redness, ear pain, ringing in the ears, wax buildup, runny nose, nasal congestion, bloody nose, or sore throat. Respiratory: Denies difficulty breathing,  shortness of breath, cough or sputum production.   Cardiovascular: Denies chest pain, chest tightness, palpitations or swelling in the hands or feet.   No other specific complaints in a complete review of systems (except as listed in HPI above).     Objective:   Physical Exam  BP 128/70 mmHg  Pulse 72  Temp(Src) 98.3 F (36.8 C) (Oral)  Wt 175 lb (79.379 kg)  SpO2 97% Wt Readings from Last 3 Encounters:  03/02/15 175 lb (79.379 kg)  01/08/15 174 lb 8 oz (79.153 kg)  01/01/14 176 lb 4 oz (79.946 kg)    General: Appears her stated age, in NAD. Skin: Warm, dry and intact. No rashes, lesions or ulcerations noted. HEENT: Head: normal shape and size; Throat/Mouth: Teeth present, mucosa pink and moist, no exudate, lesions or ulcerations noted.  Neck: Right sided, 2 cm anterior cervical lymphadenopathy. Neck supple, trachea midline. No thyromegaly present.  Cardiovascular: Normal rate and rhythm. S1,S2 noted.  No murmur, rubs or gallops noted.  Pulmonary/Chest: Normal effort and positive vesicular breath sounds. No respiratory distress. No wheezes, rales or ronchi noted.     BMET    Component Value Date/Time   NA 142 01/05/2015 1011   K 4.2 01/05/2015 1011  CL 105 01/05/2015 1011   CO2 29 01/05/2015 1011   GLUCOSE 89 01/05/2015 1011   BUN 18 01/05/2015 1011   CREATININE 0.91 01/05/2015 1011   CALCIUM 9.7 01/05/2015 1011   GFRNONAA 74.56 11/09/2009 0810   GFRAA 93 11/02/2007 0939    Lipid Panel     Component Value Date/Time   CHOL 177 01/05/2015 1011   TRIG 101.0 01/05/2015 1011   HDL 61.90 01/05/2015 1011   CHOLHDL 3 01/05/2015 1011   VLDL 20.2 01/05/2015 1011   LDLCALC 95 01/05/2015 1011    CBC    Component Value Date/Time   WBC 5.4 01/05/2015 1011   RBC 4.76 01/05/2015 1011   HGB 14.4 01/05/2015 1011   HCT 43.6 01/05/2015 1011   PLT 274.0 01/05/2015 1011   MCV 91.7 01/05/2015 1011   MCHC 33.1 01/05/2015 1011   RDW 12.4 01/05/2015 1011   LYMPHSABS 1.7  01/05/2015 1011   MONOABS 0.5 01/05/2015 1011   EOSABS 0.1 01/05/2015 1011   BASOSABS 0.0 01/05/2015 1011    Hgb A1C No results found for: HGBA1C       Assessment & Plan:  Right-sided Anterior Cervical Lymphadenopathy:  CBC today Monitor for fatigue, enlargement of node, lymphadenopathy in other areas or pain/tenderness  RTC as needed or if any of the symptoms discussed occur.

## 2015-03-02 NOTE — Patient Instructions (Signed)

## 2015-03-02 NOTE — Progress Notes (Signed)
Pre visit review using our clinic review tool, if applicable. No additional management support is needed unless otherwise documented below in the visit note. 

## 2015-05-14 DIAGNOSIS — H40003 Preglaucoma, unspecified, bilateral: Secondary | ICD-10-CM | POA: Diagnosis not present

## 2015-05-15 DIAGNOSIS — D0439 Carcinoma in situ of skin of other parts of face: Secondary | ICD-10-CM | POA: Diagnosis not present

## 2015-05-15 DIAGNOSIS — C44519 Basal cell carcinoma of skin of other part of trunk: Secondary | ICD-10-CM | POA: Diagnosis not present

## 2015-05-15 DIAGNOSIS — L821 Other seborrheic keratosis: Secondary | ICD-10-CM | POA: Diagnosis not present

## 2015-05-15 DIAGNOSIS — L570 Actinic keratosis: Secondary | ICD-10-CM | POA: Diagnosis not present

## 2015-05-15 DIAGNOSIS — X32XXXA Exposure to sunlight, initial encounter: Secondary | ICD-10-CM | POA: Diagnosis not present

## 2015-05-15 DIAGNOSIS — D485 Neoplasm of uncertain behavior of skin: Secondary | ICD-10-CM | POA: Diagnosis not present

## 2015-06-17 DIAGNOSIS — D0439 Carcinoma in situ of skin of other parts of face: Secondary | ICD-10-CM | POA: Diagnosis not present

## 2015-06-17 DIAGNOSIS — L905 Scar conditions and fibrosis of skin: Secondary | ICD-10-CM | POA: Diagnosis not present

## 2015-06-25 DIAGNOSIS — C44519 Basal cell carcinoma of skin of other part of trunk: Secondary | ICD-10-CM | POA: Diagnosis not present

## 2015-06-25 DIAGNOSIS — L905 Scar conditions and fibrosis of skin: Secondary | ICD-10-CM | POA: Diagnosis not present

## 2015-07-05 ENCOUNTER — Other Ambulatory Visit: Payer: Self-pay | Admitting: Family Medicine

## 2015-07-10 NOTE — Telephone Encounter (Signed)
Tom no DPR signed request status of simvastatin refill; advised to ck with pharmacy.

## 2015-07-14 NOTE — Telephone Encounter (Signed)
Mr Ramon called to ck status of simvastatin; advised refill done on 07/06/15. Mr Glime voiced understanding.

## 2015-07-15 NOTE — Telephone Encounter (Signed)
Mr Arrellano request help with simvastatin refill; I called [phone] and spoke with pharmacist Galt who said he could not find electronic submission of simvastatin on 07/06/15. Medication phoned to Maurice at [facility] order pharmacy as instructed. Pt should received the med in 5-7 days. Mr Brockelman notified, was appreciative and voiced understanding.

## 2015-08-19 ENCOUNTER — Other Ambulatory Visit: Payer: Self-pay | Admitting: Family Medicine

## 2015-08-19 DIAGNOSIS — Z1231 Encounter for screening mammogram for malignant neoplasm of breast: Secondary | ICD-10-CM

## 2015-09-25 DIAGNOSIS — D225 Melanocytic nevi of trunk: Secondary | ICD-10-CM | POA: Diagnosis not present

## 2015-09-25 DIAGNOSIS — L821 Other seborrheic keratosis: Secondary | ICD-10-CM | POA: Diagnosis not present

## 2015-09-25 DIAGNOSIS — Z85828 Personal history of other malignant neoplasm of skin: Secondary | ICD-10-CM | POA: Diagnosis not present

## 2015-09-25 DIAGNOSIS — D2261 Melanocytic nevi of right upper limb, including shoulder: Secondary | ICD-10-CM | POA: Diagnosis not present

## 2015-11-10 DIAGNOSIS — H40003 Preglaucoma, unspecified, bilateral: Secondary | ICD-10-CM | POA: Diagnosis not present

## 2016-01-04 ENCOUNTER — Other Ambulatory Visit: Payer: Self-pay | Admitting: Family Medicine

## 2016-01-04 DIAGNOSIS — Z0000 Encounter for general adult medical examination without abnormal findings: Secondary | ICD-10-CM

## 2016-01-04 DIAGNOSIS — E7800 Pure hypercholesterolemia, unspecified: Secondary | ICD-10-CM

## 2016-01-07 ENCOUNTER — Other Ambulatory Visit: Payer: Medicare Other

## 2016-01-07 ENCOUNTER — Ambulatory Visit (INDEPENDENT_AMBULATORY_CARE_PROVIDER_SITE_OTHER): Payer: Medicare Other

## 2016-01-07 VITALS — BP 122/78 | HR 60 | Temp 98.6°F | Ht 67.5 in | Wt 174.2 lb

## 2016-01-07 DIAGNOSIS — Z0000 Encounter for general adult medical examination without abnormal findings: Secondary | ICD-10-CM

## 2016-01-07 DIAGNOSIS — E7800 Pure hypercholesterolemia, unspecified: Secondary | ICD-10-CM

## 2016-01-07 DIAGNOSIS — Z1159 Encounter for screening for other viral diseases: Secondary | ICD-10-CM

## 2016-01-07 LAB — COMPREHENSIVE METABOLIC PANEL
ALBUMIN: 4.6 g/dL (ref 3.5–5.2)
ALT: 15 U/L (ref 0–35)
AST: 19 U/L (ref 0–37)
Alkaline Phosphatase: 71 U/L (ref 39–117)
BILIRUBIN TOTAL: 0.5 mg/dL (ref 0.2–1.2)
BUN: 17 mg/dL (ref 6–23)
CALCIUM: 9.7 mg/dL (ref 8.4–10.5)
CO2: 28 mEq/L (ref 19–32)
CREATININE: 0.85 mg/dL (ref 0.40–1.20)
Chloride: 107 mEq/L (ref 96–112)
GFR: 70.22 mL/min (ref >60.00)
Glucose, Bld: 100 mg/dL — ABNORMAL HIGH (ref 70–99)
Potassium: 4.6 mEq/L (ref 3.5–5.1)
Sodium: 142 mEq/L (ref 135–145)
TOTAL PROTEIN: 6.9 g/dL (ref 6.0–8.3)

## 2016-01-07 LAB — LIPID PANEL
CHOLESTEROL: 181 mg/dL (ref 0–200)
HDL: 67.8 mg/dL (ref >39.00)
LDL Cholesterol: 95 mg/dL (ref 0–99)
NonHDL: 113.03
TRIGLYCERIDES: 92 mg/dL (ref 0.0–149.0)
Total CHOL/HDL Ratio: 3
VLDL: 18.4 mg/dL (ref 0.0–40.0)

## 2016-01-07 LAB — TSH: TSH: 1.67 u[IU]/mL (ref 0.35–4.50)

## 2016-01-07 NOTE — Progress Notes (Signed)
PCP notes:   Health maintenance:  Tetanus vaccine - postponed/insurance FOBT - kit provided to pt Hep C screening - completed Flu vaccine - per pt, administered 10/11/2015  Abnormal screenings:   None  Patient concerns:   None  Nurse concerns:  None  Next PCP appt:   01/11/16 @ 0830

## 2016-01-07 NOTE — Progress Notes (Signed)
I reviewed health advisor's note, was available for consultation, and agree with documentation and plan.  

## 2016-01-07 NOTE — Progress Notes (Signed)
Subjective:   Natalie Sosa is a 70 y.o. female who presents for Medicare Annual (Subsequent) preventive examination.  Review of Systems:  N/A Cardiac Risk Factors include: advanced age (>32men, >57 women);dyslipidemia     Objective:     Vitals: BP 122/78 (BP Location: Left Arm, Patient Position: Sitting, Cuff Size: Normal)   Pulse 60   Temp 98.6 F (37 C) (Oral)   Ht 5' 7.5" (1.715 m) Comment: no shoes  Wt 174 lb 4 oz (79 kg)   SpO2 98%   BMI 26.89 kg/m   Body mass index is 26.89 kg/m.   Tobacco History  Smoking Status  . Never Smoker  Smokeless Tobacco  . Never Used     Counseling given: No   History reviewed. No pertinent past medical history. Past Surgical History:  Procedure Laterality Date  . ACHILLES TENDON REPAIR  06/14/2004  . CESAREAN SECTION     placenta previa  . CHOLECYSTECTOMY  06/13/2001   Family History  Problem Relation Age of Onset  . Heart disease Mother   . Dementia Mother   . Cancer Father     Pancreatic  . Stroke Father   . Breast cancer Paternal Grandmother 39   History  Sexual Activity  . Sexual activity: Yes    Outpatient Encounter Prescriptions as of 01/07/2016  Medication Sig  . ibuprofen (ADVIL,MOTRIN) 200 MG tablet Use as directed   . Multiple Vitamin (MULTIVITAMIN) tablet Take 1 tablet by mouth daily.    . simvastatin (ZOCOR) 20 MG tablet TAKE 1 TABLET BY MOUTH AT BEDTIME   No facility-administered encounter medications on file as of 01/07/2016.     Activities of Daily Living In your present state of health, do you have any difficulty performing the following activities: 01/07/2016  Hearing? N  Vision? N  Difficulty concentrating or making decisions? N  Walking or climbing stairs? N  Dressing or bathing? N  Doing errands, shopping? N  Preparing Food and eating ? N  Using the Toilet? N  In the past six months, have you accidently leaked urine? N  Do you have problems with loss of bowel control? N  Managing  your Medications? N  Managing your Finances? N  Housekeeping or managing your Housekeeping? N  Some recent data might be hidden    Patient Care Team: Lucille Passy, MD as PCP - [place], DPM as Consulting Physician (Podiatry) Leandrew Koyanagi, MD as Referring Physician (Ophthalmology) Oneta Rack, MD as Consulting Physician (Dermatology) Satira Anis. Ezzard Flax, MD as Consulting Physician (Dentistry)    Assessment:     Hearing Screening   125Hz  250Hz  500Hz  1000Hz  2000Hz  3000Hz  4000Hz  6000Hz  8000Hz   Right ear:   40 40 40  40    Left ear:   40 40 40  40    Vision Screening Comments: Last vision exam in October 2017 with Dr. Wallace Going   Exercise Activities and Dietary recommendations Current Exercise Habits: Home exercise routine, Type of exercise: walking;Other - see comments (tennis), Time (Minutes): > 60, Frequency (Times/Week): 4, Weekly Exercise (Minutes/Week): 0, Intensity: Moderate, Exercise limited by: None identified  Goals    . Increase physical activity          Starting 01/07/2016, I will continue to exercise at least 90 min 3-4 days per week.       Fall Risk Fall Risk  01/07/2016 01/08/2015 01/01/2014  Falls in the past year? No No No   Depression Screen PHQ 2/9 Scores  01/07/2016 01/08/2015 01/01/2014 11/14/2011  PHQ - 2 Score 0 0 0 0     Cognitive Function MMSE - Mini Mental State Exam 01/07/2016  Orientation to time 5  Orientation to Place 5  Registration 3  Attention/ Calculation 0  Recall 3  Language- name 2 objects 0  Language- repeat 1  Language- follow 3 step command 3  Language- read & follow direction 0  Write a sentence 0  Copy design 0  Total score 20     PLEASE NOTE: A Mini-Cog screen was completed. Maximum score is 20. A value of 0 denotes this part of Folstein MMSE was not completed or the patient failed this part of the Mini-Cog screening.   Mini-Cog Screening Orientation to Time - Max 5 pts Orientation to Place - Max  5 pts Registration - Max 3 pts Recall - Max 3 pts Language Repeat - Max 1 pts Language Follow 3 Step Command - Max 3 pts     Immunization History  Administered Date(s) Administered  . Influenza Split 12/29/2010  . Influenza Whole 11/05/2007, 11/09/2009  . Influenza-Unspecified 10/24/2012, 10/07/2013  . Pneumococcal Conjugate-13 01/01/2014  . Pneumococcal Polysaccharide-23 11/11/2010  . Td 10/13/2005  . Zoster 03/20/2007   Screening Tests Health Maintenance  Topic Date Due  . TETANUS/TDAP  10/12/2025 (Originally 10/14/2015)  . COLONOSCOPY  01/06/2026 (Originally 09/18/1995)  . COLON CANCER SCREENING ANNUAL FOBT  01/13/2016  . MAMMOGRAM  01/22/2016  . INFLUENZA VACCINE  Addressed  . DEXA SCAN  Completed  . ZOSTAVAX  Completed  . Hepatitis C Screening  Completed  . PNA vac Low Risk Adult  Completed      Plan:     I have personally reviewed and addressed the Medicare Annual Wellness questionnaire and have noted the following in the patient's chart:  A. Medical and social history B. Use of alcohol, tobacco or illicit drugs  C. Current medications and supplements D. Functional ability and status E.  Nutritional status F.  Physical activity G. Advance directives H. List of other physicians I.  Hospitalizations, surgeries, and ER visits in previous 12 months J.  [place] to include hearing, vision, cognitive, depression L. Referrals and appointments - none  In addition, I have reviewed and discussed with patient certain preventive protocols, quality metrics, and best practice recommendations. A written personalized care plan for preventive services as well as general preventive health recommendations were provided to patient.  See attached scanned questionnaire for additional information.   Signed,   Lindell Noe, MHA, BS, LPN Health Coach

## 2016-01-07 NOTE — Patient Instructions (Signed)
Ms. Gamba , Thank you for taking time to come for your Medicare Wellness Visit. I appreciate your ongoing commitment to your health goals. Please review the following plan we discussed and let me know if I can assist you in the future.   These are the goals we discussed: Goals    . Increase physical activity          Starting 01/07/2016, I will continue to exercise at least 90 min 3-4 days per week.        This is a list of the screening recommended for you and due dates:  Health Maintenance  Topic Date Due  . Tetanus Vaccine  10/12/2025*  . Colon Cancer Screening  01/06/2026*  . Stool Blood Test  01/13/2016  . Mammogram  01/22/2016  . Flu Shot  Addressed  . DEXA scan (bone density measurement)  Completed  . Shingles Vaccine  Completed  .  Hepatitis C: One time screening is recommended by Center for Disease Control  (CDC) for  adults born from [birth year] through 1965.   Completed  . Pneumonia vaccines  Completed  *Topic was postponed. The date shown is not the original due date.   Preventive Care for Adults  A healthy lifestyle and preventive care can promote health and wellness. Preventive health guidelines for adults include the following key practices.  . A routine yearly physical is a good way to check with your health care provider about your health and preventive screening. It is a chance to share any concerns and updates on your health and to receive a thorough exam.  . Visit your dentist for a routine exam and preventive care every 6 months. Brush your teeth twice a day and floss once a day. Good oral hygiene prevents tooth decay and gum disease.  . The frequency of eye exams is based on your age, health, family medical history, use  of contact lenses, and other factors. Follow your health care provider's ecommendations for frequency of eye exams.  . Eat a healthy diet. Foods like vegetables, fruits, whole grains, low-fat dairy products, and lean protein foods contain the  nutrients you need without too many calories. Decrease your intake of foods high in solid fats, added sugars, and salt. Eat the right amount of calories for you. Get information about a proper diet from your health care provider, if necessary.  . Regular physical exercise is one of the most important things you can do for your health. Most adults should get at least 150 minutes of moderate-intensity exercise (any activity that increases your heart rate and causes you to sweat) each week. In addition, most adults need muscle-strengthening exercises on 2 or more days a week.  Silver Sneakers may be a benefit available to you. To determine eligibility, you may visit the website: www.silversneakers.com or contact program at [phone] Mon-Fri between 8AM-8PM.   . Maintain a healthy weight. The body mass index (BMI) is a screening tool to identify possible weight problems. It provides an estimate of body fat based on height and weight. Your health care provider can find your BMI and can help you achieve or maintain a healthy weight.   For adults 20 years and older: ? A BMI below 18.5 is considered underweight. ? A BMI of 18.5 to 24.9 is normal. ? A BMI of 25 to 29.9 is considered overweight. ? A BMI of 30 and above is considered obese.   . Maintain normal blood lipids and cholesterol levels by exercising and minimizing  your intake of saturated fat. Eat a balanced diet with plenty of fruit and vegetables. Blood tests for lipids and cholesterol should begin at age 88 and be repeated every 5 years. If your lipid or cholesterol levels are high, you are over 50, or you are at high risk for heart disease, you may need your cholesterol levels checked more frequently. Ongoing high lipid and cholesterol levels should be treated with medicines if diet and exercise are not working.  . If you smoke, find out from your health care provider how to quit. If you do not use tobacco, please do not start.  . If you  choose to drink alcohol, please do not consume more than 2 drinks per day. One drink is considered to be 12 ounces (355 mL) of beer, 5 ounces (148 mL) of wine, or 1.5 ounces (44 mL) of liquor.  . If you are 15-18 years old, ask your health care provider if you should take aspirin to prevent strokes.  . Use sunscreen. Apply sunscreen liberally and repeatedly throughout the day. You should seek shade when your shadow is shorter than you. Protect yourself by wearing long sleeves, pants, a wide-brimmed hat, and sunglasses year round, whenever you are outdoors.  . Once a month, do a whole body skin exam, using a mirror to look at the skin on your back. Tell your health care provider of new moles, moles that have irregular borders, moles that are larger than a pencil eraser, or moles that have changed in shape or color.

## 2016-01-07 NOTE — Progress Notes (Signed)
Pre visit review using our clinic review tool, if applicable. No additional management support is needed unless otherwise documented below in the visit note. 

## 2016-01-08 LAB — CBC WITH DIFFERENTIAL/PLATELET
BASOS ABS: 0 10*3/uL (ref 0.0–0.1)
BASOS PCT: 0.6 % (ref 0.0–3.0)
EOS ABS: 0.2 10*3/uL (ref 0.0–0.7)
Eosinophils Relative: 3.4 % (ref 0.0–5.0)
HEMATOCRIT: 41.5 % (ref 36.0–46.0)
HEMOGLOBIN: 14.1 g/dL (ref 12.0–15.0)
LYMPHS PCT: 29.3 % (ref 12.0–46.0)
Lymphs Abs: 1.5 10*3/uL (ref 0.7–4.0)
MCHC: 34 g/dL (ref 30.0–36.0)
MCV: 90.6 fl (ref 78.0–100.0)
Monocytes Absolute: 0.4 10*3/uL (ref 0.1–1.0)
Monocytes Relative: 8.2 % (ref 3.0–12.0)
Neutro Abs: 2.9 10*3/uL (ref 1.4–7.7)
Neutrophils Relative %: 58.5 % (ref 43.0–77.0)
Platelets: 243 10*3/uL (ref 150.0–400.0)
RBC: 4.58 Mil/uL (ref 3.87–5.11)
RDW: 12.2 % (ref 11.5–15.5)
WBC: 5 10*3/uL (ref 4.0–10.5)

## 2016-01-08 LAB — HEPATITIS C ANTIBODY: HCV AB: NEGATIVE

## 2016-01-11 ENCOUNTER — Telehealth: Payer: Self-pay | Admitting: *Deleted

## 2016-01-11 ENCOUNTER — Encounter: Payer: Self-pay | Admitting: Family Medicine

## 2016-01-11 ENCOUNTER — Ambulatory Visit (INDEPENDENT_AMBULATORY_CARE_PROVIDER_SITE_OTHER): Payer: Medicare Other | Admitting: Family Medicine

## 2016-01-11 VITALS — BP 122/64 | HR 75 | Temp 98.5°F | Ht 67.25 in | Wt 176.5 lb

## 2016-01-11 DIAGNOSIS — Z0000 Encounter for general adult medical examination without abnormal findings: Secondary | ICD-10-CM | POA: Insufficient documentation

## 2016-01-11 DIAGNOSIS — Z01419 Encounter for gynecological examination (general) (routine) without abnormal findings: Secondary | ICD-10-CM | POA: Diagnosis not present

## 2016-01-11 DIAGNOSIS — E7800 Pure hypercholesterolemia, unspecified: Secondary | ICD-10-CM

## 2016-01-11 DIAGNOSIS — Z1211 Encounter for screening for malignant neoplasm of colon: Secondary | ICD-10-CM | POA: Diagnosis not present

## 2016-01-11 DIAGNOSIS — G4700 Insomnia, unspecified: Secondary | ICD-10-CM

## 2016-01-11 MED ORDER — SIMVASTATIN 20 MG PO TABS: 20.0000 mg | ORAL_TABLET | Freq: Every day | ORAL | 3 refills | Status: DC

## 2016-01-11 NOTE — Progress Notes (Signed)
Subjective:   Patient ID: Natalie Sosa, female    DOB: 1945/10/20, 70 y.o.   MRN: [id number]  Natalie Sosa is a pleasant 70 y.o. year old female who presents to clinic today with Annual Exam  on 01/11/2016  HPI:  Annual medicare wellness visit with Candis Musa, RN on 01/07/16.  Note reviewed. Mammogram scheduled for 01/26/16.  HLD - has been on simvastatin 20 mg daily for years.  Doing well and remains very physically active.  Still playing tennis and walking.  Lab Results  Component Value Date   CHOL 181 01/07/2016   HDL 67.80 01/07/2016   LDLCALC 95 01/07/2016   TRIG 92.0 01/07/2016   CHOLHDL 3 01/07/2016   Lab Results  Component Value Date   ALT 15 01/07/2016   AST 19 01/07/2016   ALKPHOS 71 01/07/2016   BILITOT 0.5 01/07/2016   Lab Results  Component Value Date   NA 142 01/07/2016   K 4.6 01/07/2016   CL 107 01/07/2016   CO2 28 01/07/2016   Lab Results  Component Value Date   TSH 1.67 01/07/2016   Current Outpatient Prescriptions on File Prior to Visit  Medication Sig Dispense Refill  . ibuprofen (ADVIL,MOTRIN) 200 MG tablet Use as directed     . Multiple Vitamin (MULTIVITAMIN) tablet Take 1 tablet by mouth daily.      . simvastatin (ZOCOR) 20 MG tablet TAKE 1 TABLET BY MOUTH AT BEDTIME 90 tablet 1   No current facility-administered medications on file prior to visit.     Allergies  Allergen Reactions  . Ezetimibe-Simvastatin     REACTION: Achey  . Hydrocodone     REACTION: Itchy, rash  . Prednisone     REACTION: anxiety, hyper    No past medical history on file.  Past Surgical History:  Procedure Laterality Date  . ACHILLES TENDON REPAIR  06/14/2004  . CESAREAN SECTION     placenta previa  . CHOLECYSTECTOMY  06/13/2001    Family History  Problem Relation Age of Onset  . Heart disease Mother   . Dementia Mother   . Cancer Father     Pancreatic  . Stroke Father   . Breast cancer Paternal Grandmother 13    Social History   Social  History  . Marital status: Married    Spouse name: N/A  . Number of children: 1  . Years of education: N/A   Occupational History  . Not on file.   Social History Main Topics  . Smoking status: Never Smoker  . Smokeless tobacco: Never Used  . Alcohol use Yes  . Drug use: Unknown  . Sexual activity: Yes   Other Topics Concern  . Not on file   Social History Narrative   Husband Gershon Mussel, aware of her end of life wishes.  She would desire CPR, no feedings tubes.   The PMH, PSH, Social History, Family History, Medications, and allergies have been reviewed in [facility], and have been updated if relevant.   Review of Systems  Constitutional: Negative.   HENT: Negative.   Eyes: Negative.   Respiratory: Negative.   Cardiovascular: Negative.   Gastrointestinal: Negative.   Endocrine: Negative.   Genitourinary: Negative.   Musculoskeletal: Negative.   Skin: Negative.   Allergic/Immunologic: Negative.   Neurological: Negative.   Hematological: Negative.   Psychiatric/Behavioral: Negative.   All other systems reviewed and are negative.      Objective:    BP 122/64   Pulse 75  Temp 98.5 F (36.9 C) (Oral)   Ht 5' 7.25" (1.708 m)   Wt 176 lb 8 oz (80.1 kg)   SpO2 98%   BMI 27.44 kg/m   BP Readings from Last 3 Encounters:  01/11/16 122/64  01/07/16 122/78  03/02/15 128/70     Physical Exam  Constitutional: She is oriented to person, place, and time. She appears well-developed and well-nourished. No distress.  HENT:  Head: Normocephalic and atraumatic.  Eyes: Conjunctivae are normal.  Neck: Normal range of motion.  Cardiovascular: Normal rate and regular rhythm.   Pulmonary/Chest: Effort normal and breath sounds normal. No respiratory distress. Right breast exhibits no inverted nipple, no mass, no nipple discharge, no skin change and no tenderness. Left breast exhibits no inverted nipple, no mass, no nipple discharge, no skin change and no tenderness. Breasts are  symmetrical.  Abdominal: Soft. Bowel sounds are normal.  Musculoskeletal: Normal range of motion.  Neurological: She is alert and oriented to person, place, and time. No cranial nerve deficit.  Skin: Skin is warm and dry. She is not diaphoretic.  Psychiatric: She has a normal mood and affect. Her behavior is normal. Judgment and thought content normal.  Nursing note and vitals reviewed.         Assessment & Plan:   Insomnia, unspecified type  Well woman exam  HYPERCHOLESTEROLEMIA No Follow-up on file.

## 2016-01-11 NOTE — Assessment & Plan Note (Signed)
Reviewed preventive care protocols, scheduled due services, and updated immunizations Discussed nutrition, exercise, diet, and healthy lifestyle.  

## 2016-01-11 NOTE — Progress Notes (Signed)
Pre visit review using our clinic review tool, if applicable. No additional management support is needed unless otherwise documented below in the visit note. 

## 2016-01-11 NOTE — Assessment & Plan Note (Signed)
Well controlled. No changes made to rxs. 

## 2016-01-11 NOTE — Patient Instructions (Signed)
Great to see you. Happy Holidays.   

## 2016-01-11 NOTE — Telephone Encounter (Signed)
Yes ok to refill 

## 2016-01-11 NOTE — Telephone Encounter (Signed)
Patient left a voicemail stating that she was to call back with her pharmacy information for refill on Simvastatin Patient stated that her pharmacy is [facility] [address] Telephone number [phone]  See allergy/contraindication. Okay to fill?

## 2016-01-13 ENCOUNTER — Other Ambulatory Visit: Payer: Medicare Other

## 2016-01-14 ENCOUNTER — Telehealth: Payer: Self-pay | Admitting: Radiology

## 2016-01-14 ENCOUNTER — Other Ambulatory Visit: Payer: Medicare Other

## 2016-01-14 DIAGNOSIS — Z1211 Encounter for screening for malignant neoplasm of colon: Secondary | ICD-10-CM

## 2016-01-14 NOTE — Addendum Note (Signed)
Addended by: Royann Shivers A on: 01/14/2016 03:33 PM   Modules accepted: Orders

## 2016-01-14 NOTE — Telephone Encounter (Signed)
[facility] called a positive ifob. Results given to Dr Deborra Medina

## 2016-01-14 NOTE — Addendum Note (Signed)
Addended by: Royann Shivers A on: 01/14/2016 09:28 AM   Modules accepted: Orders

## 2016-01-14 NOTE — Telephone Encounter (Signed)
Please let pt know that her stool cards were positive for blood.  We can either repeat them if we think it came from hemorrhoid or other source of benign bleeding or refer to GI. Which would she prefer?

## 2016-01-15 NOTE — Telephone Encounter (Signed)
Lm on pts vm requesting a call back 

## 2016-01-20 NOTE — Telephone Encounter (Signed)
Lm on pts vm requesting a call back 

## 2016-01-26 ENCOUNTER — Telehealth: Payer: Self-pay | Admitting: Family Medicine

## 2016-01-26 ENCOUNTER — Encounter: Payer: Self-pay | Admitting: *Deleted

## 2016-01-26 ENCOUNTER — Ambulatory Visit
Admission: RE | Admit: 2016-01-26 | Discharge: 2016-01-26 | Disposition: A | Discharge status: Home / Self Care | Payer: Medicare Other | Source: Ambulatory Visit | Attending: Family Medicine | Admitting: Family Medicine

## 2016-01-26 DIAGNOSIS — Z1231 Encounter for screening mammogram for malignant neoplasm of breast: Secondary | ICD-10-CM | POA: Insufficient documentation

## 2016-01-26 NOTE — Telephone Encounter (Signed)
Lm on pts vm requesting a call back. Attempt to contact letter mailed 

## 2016-01-26 NOTE — Telephone Encounter (Signed)
See additional phone note. 

## 2016-01-26 NOTE — Telephone Encounter (Signed)
Spoke to pt and informed her of results. Pt states she prefers to repeat iFob before referral; advised to pickup from office

## 2016-01-26 NOTE — Telephone Encounter (Signed)
Pt returned call from my chart message regarding labs.  Please call [phone] Thanks

## 2016-01-27 ENCOUNTER — Encounter: Payer: Self-pay | Admitting: Family Medicine

## 2016-01-27 DIAGNOSIS — Z1231 Encounter for screening mammogram for malignant neoplasm of breast: Secondary | ICD-10-CM | POA: Diagnosis not present

## 2016-02-02 ENCOUNTER — Other Ambulatory Visit: Payer: Self-pay | Admitting: Family Medicine

## 2016-02-02 ENCOUNTER — Other Ambulatory Visit (INDEPENDENT_AMBULATORY_CARE_PROVIDER_SITE_OTHER): Payer: Medicare Other

## 2016-02-02 DIAGNOSIS — Z1211 Encounter for screening for malignant neoplasm of colon: Secondary | ICD-10-CM

## 2016-02-02 LAB — FECAL OCCULT BLOOD, IMMUNOCHEMICAL: FECAL OCCULT BLD: NEGATIVE

## 2016-05-11 DIAGNOSIS — H40003 Preglaucoma, unspecified, bilateral: Secondary | ICD-10-CM | POA: Diagnosis not present

## 2016-05-16 DIAGNOSIS — H40003 Preglaucoma, unspecified, bilateral: Secondary | ICD-10-CM | POA: Diagnosis not present

## 2016-09-21 DIAGNOSIS — D485 Neoplasm of uncertain behavior of skin: Secondary | ICD-10-CM | POA: Diagnosis not present

## 2016-09-21 DIAGNOSIS — C44619 Basal cell carcinoma of skin of left upper limb, including shoulder: Secondary | ICD-10-CM | POA: Diagnosis not present

## 2016-09-21 DIAGNOSIS — X32XXXA Exposure to sunlight, initial encounter: Secondary | ICD-10-CM | POA: Diagnosis not present

## 2016-09-21 DIAGNOSIS — Z08 Encounter for follow-up examination after completed treatment for malignant neoplasm: Secondary | ICD-10-CM | POA: Diagnosis not present

## 2016-09-21 DIAGNOSIS — Z85828 Personal history of other malignant neoplasm of skin: Secondary | ICD-10-CM | POA: Diagnosis not present

## 2016-09-21 DIAGNOSIS — L570 Actinic keratosis: Secondary | ICD-10-CM | POA: Diagnosis not present

## 2016-09-21 DIAGNOSIS — L821 Other seborrheic keratosis: Secondary | ICD-10-CM | POA: Diagnosis not present

## 2016-09-24 IMAGING — MG MM DIGITAL SCREENING BILAT W/ CAD
5 series · 5 of 5 positions shown · non-contrast
Comparison: Previous exam(s).

CLINICAL DATA: Screening.

EXAM:
DIGITAL SCREENING BILATERAL MAMMOGRAM WITH CAD

[R MLO (1 of 2)]
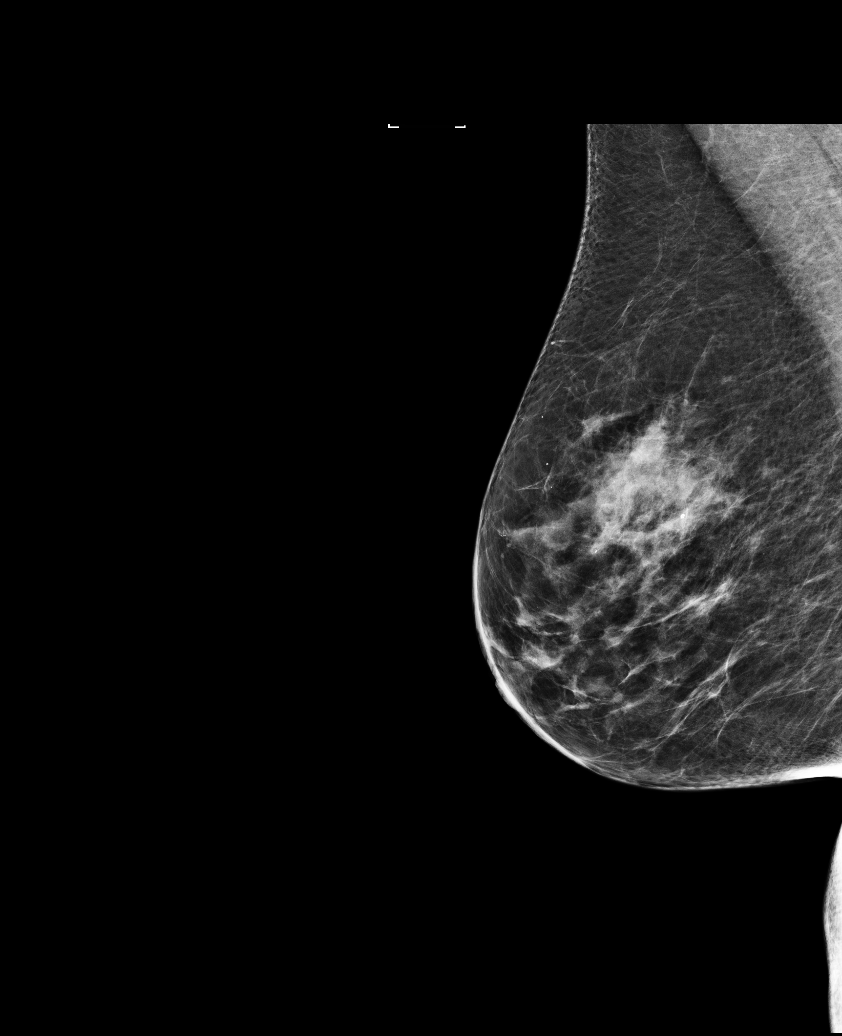

[R MLO (2 of 2)]
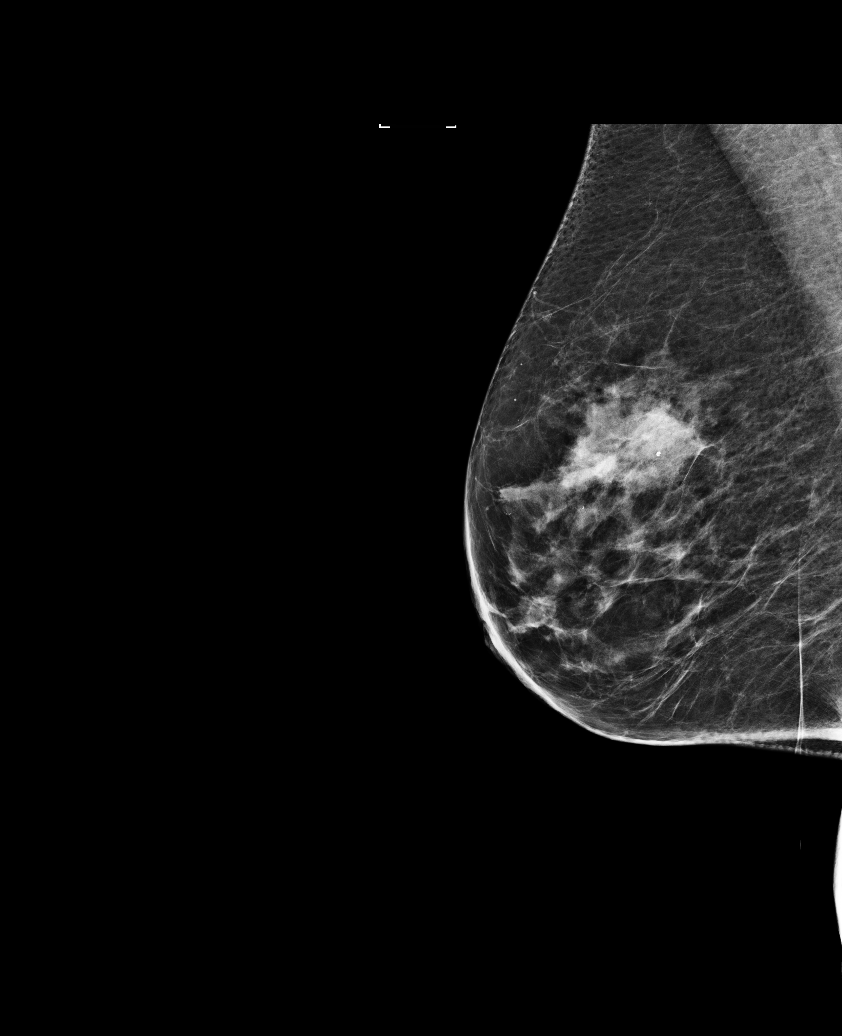

[L MLO]
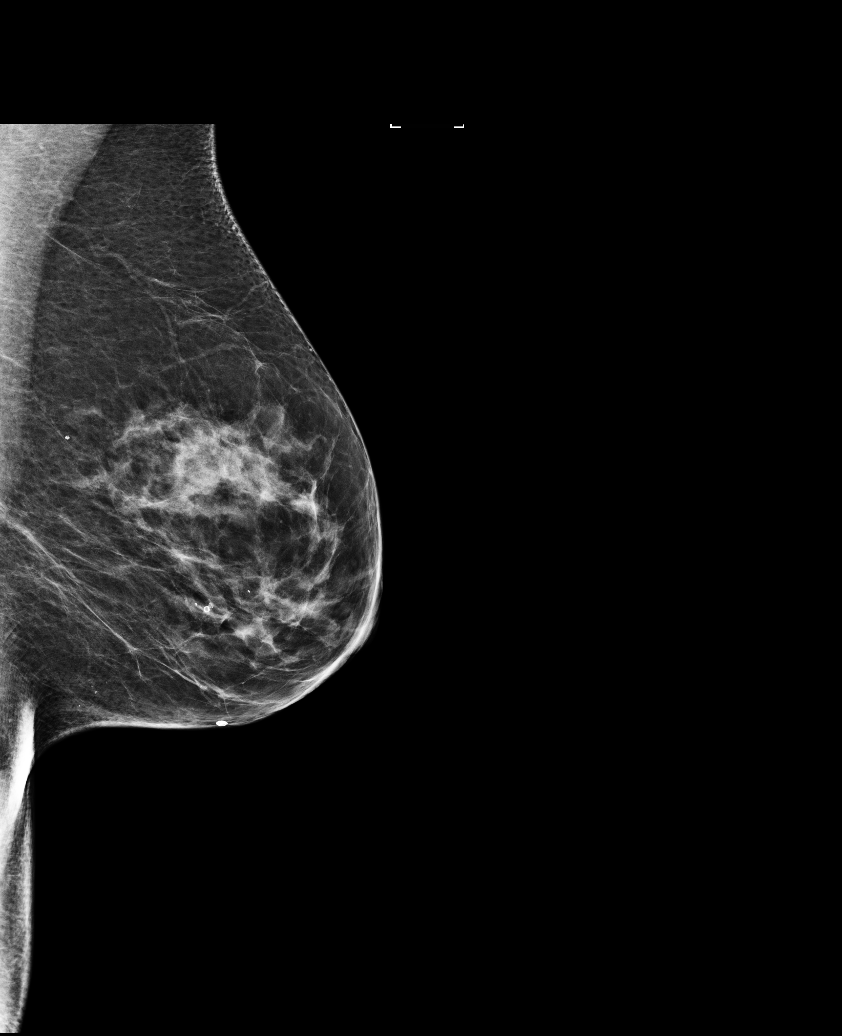

[R CC]
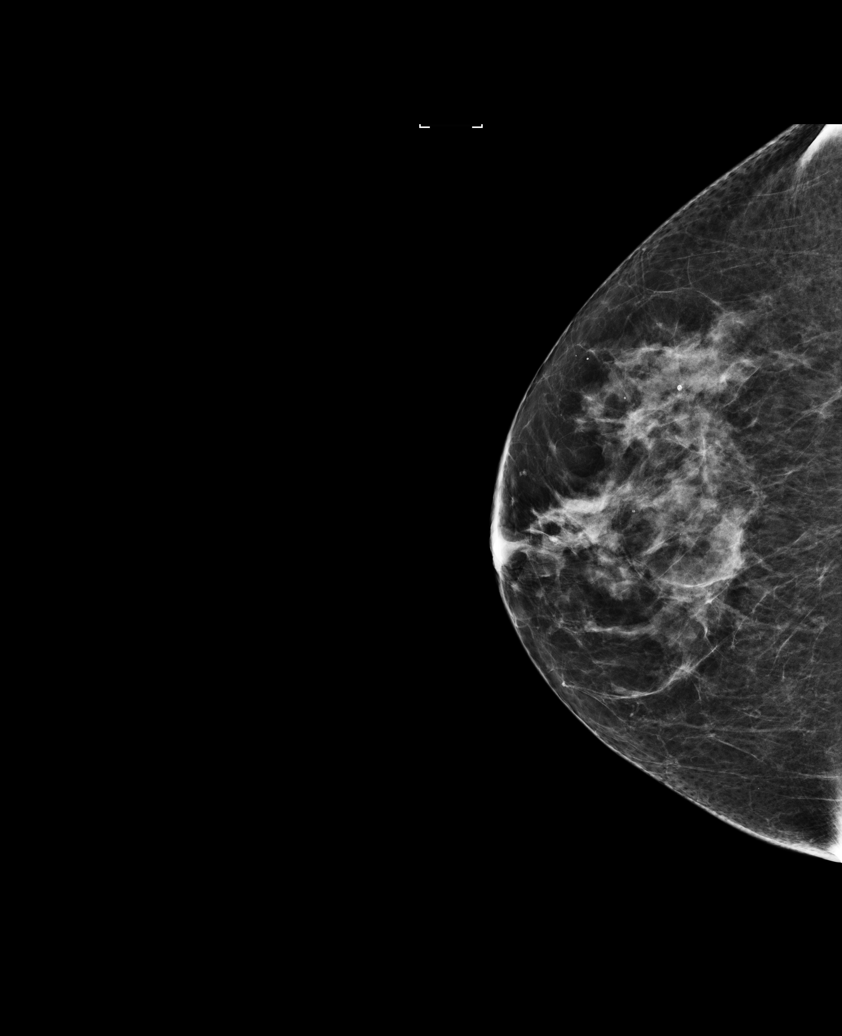

[L CC]
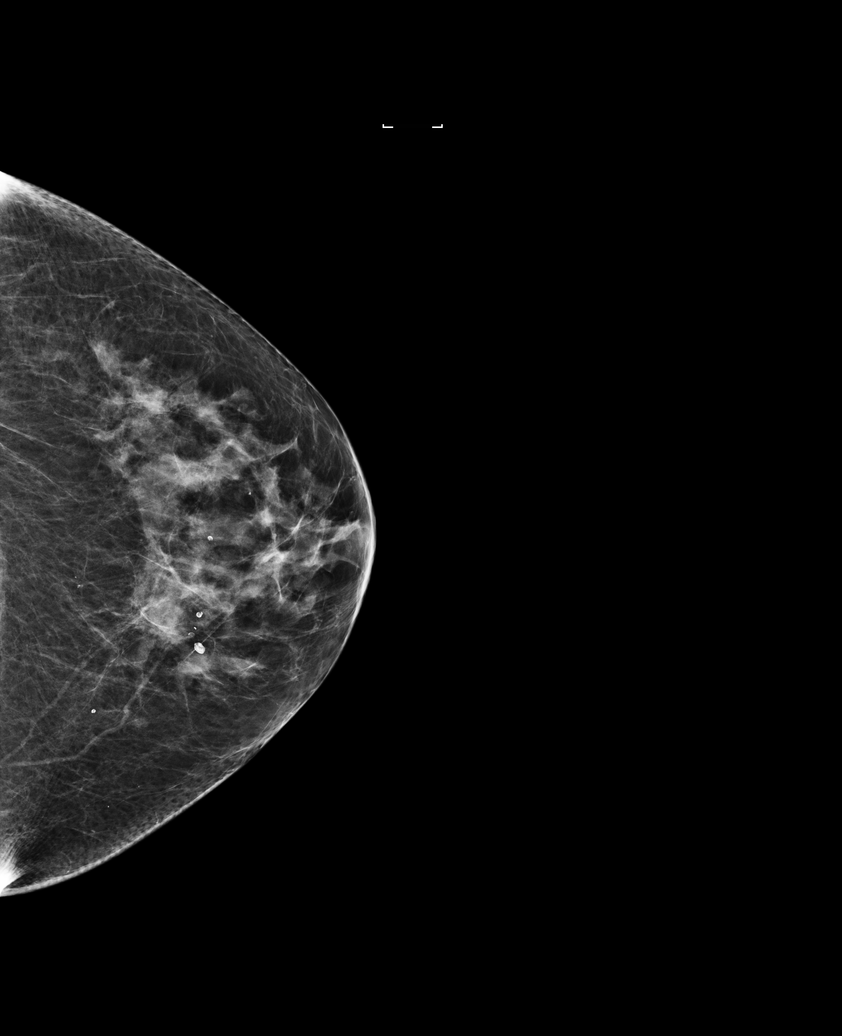

[5 of 5 positions shown; findings below may reference images not displayed]

ACR Breast Density Category b: There are scattered areas of
fibroglandular density.
FINDINGS: There are no findings suspicious for malignancy. Images were
processed with CAD.
IMPRESSION: No mammographic evidence of malignancy. A result letter of this
screening mammogram will be mailed directly to the patient.

RECOMMENDATION:
Screening mammogram in one year. (Code:AS-G-LCT)

BI-RADS CATEGORY  1: Negative.

## 2016-09-29 DIAGNOSIS — C44619 Basal cell carcinoma of skin of left upper limb, including shoulder: Secondary | ICD-10-CM | POA: Diagnosis not present

## 2016-11-03 ENCOUNTER — Ambulatory Visit (INDEPENDENT_AMBULATORY_CARE_PROVIDER_SITE_OTHER): Payer: Medicare Other | Admitting: Family Medicine

## 2016-11-03 ENCOUNTER — Encounter: Payer: Self-pay | Admitting: Family Medicine

## 2016-11-03 VITALS — BP 110/78 | HR 63 | Temp 97.5°F | Ht 67.25 in | Wt 165.0 lb

## 2016-11-03 DIAGNOSIS — M533 Sacrococcygeal disorders, not elsewhere classified: Secondary | ICD-10-CM | POA: Diagnosis not present

## 2016-11-03 NOTE — Progress Notes (Signed)
SUBJECTIVE:  Natalie Sosa is a 71 y.o. female who complains of low back pain for 2 month(s), positional with bending or lifting, without radiation down the legs. Precipitating factors: none recalled by the patient. Prior history of back problems: recurrent self limited episodes of low back pain in the past. There is no numbness in the legs.  Current Outpatient Prescriptions on File Prior to Visit  Medication Sig Dispense Refill  . ibuprofen (ADVIL,MOTRIN) 200 MG tablet Use as directed     . Multiple Vitamin (MULTIVITAMIN) tablet Take 1 tablet by mouth daily.      . simvastatin (ZOCOR) 20 MG tablet Take 1 tablet (20 mg total) by mouth at bedtime. 90 tablet 3   No current facility-administered medications on file prior to visit.     Allergies  Allergen Reactions  . Ezetimibe-Simvastatin     REACTION: Achey  . Hydrocodone     REACTION: Itchy, rash  . Prednisone     REACTION: anxiety, hyper    No past medical history on file.  Past Surgical History:  Procedure Laterality Date  . ACHILLES TENDON REPAIR  06/14/2004  . CESAREAN SECTION     placenta previa  . CHOLECYSTECTOMY  06/13/2001    Family History  Problem Relation Age of Onset  . Heart disease Mother   . Dementia Mother   . Cancer Father        Pancreatic  . Stroke Father   . Breast cancer Paternal Grandmother 71    Social History   Social History  . Marital status: Married    Spouse name: N/A  . Number of children: 1  . Years of education: N/A   Occupational History  . Not on file.   Social History Main Topics  . Smoking status: Never Smoker  . Smokeless tobacco: Never Used  . Alcohol use Yes  . Drug use: Unknown  . Sexual activity: Yes   Other Topics Concern  . Not on file   Social History Narrative   Husband Gershon Mussel, aware of her end of life wishes.  She would desire CPR, no feedings tubes.   The PMH, PSH, Social History, Family History, Medications, and allergies have been reviewed in [facility], and have  been updated if relevant.   OBJECTIVE: BP 110/78 (BP Location: Left Arm, Patient Position: Sitting, Cuff Size: Normal)   Pulse 63   Temp (!) 97.5 F (36.4 C) (Oral)   Ht 5' 7.25" (1.708 m)   Wt 165 lb (74.8 kg)   SpO2 97%   BMI 25.65 kg/m   Patient appears to be in mild to moderate pain, antalgic gait noted. Lumbosacral spine area reveals no local tenderness or mass.  Painful and reduced LS ROM noted. TTP over left SI joint, exam otherwise unremarkable  ASSESSMENT:  SI joint pain  PLAN: For acute pain, rest, intermittent application of heat (do not sleep on heating pad), analgesics and muscle relaxants are recommended. Discussed longer term treatment plan of prn NSAID's and discussed a home back care exercise program with flexion exercise routine. Proper lifting with avoidance of heavy lifting discussed. Consider Physical Therapy and XRay studies if not improving. Call or return to clinic prn if these symptoms worsen or fail to improve as anticipated.

## 2016-11-15 DIAGNOSIS — H40003 Preglaucoma, unspecified, bilateral: Secondary | ICD-10-CM | POA: Diagnosis not present

## 2017-01-13 ENCOUNTER — Ambulatory Visit (INDEPENDENT_AMBULATORY_CARE_PROVIDER_SITE_OTHER): Payer: Medicare Other

## 2017-01-13 VITALS — BP 104/60 | HR 63 | Temp 98.3°F | Ht 67.25 in | Wt 166.5 lb

## 2017-01-13 DIAGNOSIS — Z08 Encounter for follow-up examination after completed treatment for malignant neoplasm: Secondary | ICD-10-CM | POA: Diagnosis not present

## 2017-01-13 DIAGNOSIS — L821 Other seborrheic keratosis: Secondary | ICD-10-CM | POA: Diagnosis not present

## 2017-01-13 DIAGNOSIS — Z85828 Personal history of other malignant neoplasm of skin: Secondary | ICD-10-CM | POA: Diagnosis not present

## 2017-01-13 DIAGNOSIS — Z0000 Encounter for general adult medical examination without abnormal findings: Secondary | ICD-10-CM | POA: Diagnosis not present

## 2017-01-13 DIAGNOSIS — E7800 Pure hypercholesterolemia, unspecified: Secondary | ICD-10-CM

## 2017-01-13 LAB — CBC WITH DIFFERENTIAL/PLATELET
BASOS PCT: 1 % (ref 0.0–3.0)
Basophils Absolute: 0.1 10*3/uL (ref 0.0–0.1)
EOS PCT: 2.1 % (ref 0.0–5.0)
Eosinophils Absolute: 0.1 10*3/uL (ref 0.0–0.7)
HEMATOCRIT: 40.8 % (ref 36.0–46.0)
HEMOGLOBIN: 13.6 g/dL (ref 12.0–15.0)
LYMPHS PCT: 32.3 % (ref 12.0–46.0)
Lymphs Abs: 1.6 10*3/uL (ref 0.7–4.0)
MCHC: 33.4 g/dL (ref 30.0–36.0)
MCV: 93.5 fl (ref 78.0–100.0)
MONOS PCT: 10 % (ref 3.0–12.0)
Monocytes Absolute: 0.5 10*3/uL (ref 0.1–1.0)
NEUTROS ABS: 2.7 10*3/uL (ref 1.4–7.7)
Neutrophils Relative %: 54.6 % (ref 43.0–77.0)
PLATELETS: 246 10*3/uL (ref 150.0–400.0)
RBC: 4.37 Mil/uL (ref 3.87–5.11)
RDW: 12.4 % (ref 11.5–15.5)
WBC: 5 10*3/uL (ref 4.0–10.5)

## 2017-01-13 LAB — COMPREHENSIVE METABOLIC PANEL
ALT: 12 U/L (ref 0–35)
AST: 18 U/L (ref 0–37)
Albumin: 4.2 g/dL (ref 3.5–5.2)
Alkaline Phosphatase: 60 U/L (ref 39–117)
BUN: 19 mg/dL (ref 6–23)
CHLORIDE: 106 meq/L (ref 96–112)
CO2: 29 meq/L (ref 19–32)
Calcium: 9.2 mg/dL (ref 8.4–10.5)
Creatinine, Ser: 0.87 mg/dL (ref 0.40–1.20)
GFR: 68.16 mL/min (ref >60.00)
GLUCOSE: 93 mg/dL (ref 70–99)
POTASSIUM: 4.1 meq/L (ref 3.5–5.1)
Sodium: 140 mEq/L (ref 135–145)
Total Bilirubin: 0.5 mg/dL (ref 0.2–1.2)
Total Protein: 6.6 g/dL (ref 6.0–8.3)

## 2017-01-13 LAB — LIPID PANEL
CHOL/HDL RATIO: 2
Cholesterol: 147 mg/dL (ref 0–200)
HDL: 65.5 mg/dL (ref >39.00)
LDL CALC: 67 mg/dL (ref 0–99)
NONHDL: 81.9
Triglycerides: 76 mg/dL (ref 0.0–149.0)
VLDL: 15.2 mg/dL (ref 0.0–40.0)

## 2017-01-13 LAB — TSH: TSH: 1.26 u[IU]/mL (ref 0.35–4.50)

## 2017-01-13 NOTE — Progress Notes (Signed)
Subjective:   Natalie Sosa is a 71 y.o. female who presents for Medicare Annual (Subsequent) preventive examination.  Review of Systems:  N/A Cardiac Risk Factors include: advanced age (>37men, >103 women);dyslipidemia     Objective:     Vitals: BP 104/60 (BP Location: Right Arm, Patient Position: Sitting, Cuff Size: Normal)   Pulse 63   Temp 98.3 F (36.8 C) (Oral)   Ht 5' 7.25" (1.708 m) Comment: no shoes  Wt 166 lb 8 oz (75.5 kg)   SpO2 98%   BMI 25.88 kg/m   Body mass index is 25.88 kg/m.  Advanced Directives 01/13/2017 01/07/2016  Does Patient Have a Medical Advance Directive? Yes Yes  Type of Advance Directive Living will;Healthcare Power of [place];Living will  Copy of [place] in Chart? No - copy requested No - copy requested    Tobacco Social History   Tobacco Use  Smoking Status Never Smoker  Smokeless Tobacco Never Used     Counseling given: No   Clinical Intake:  Pre-visit preparation completed: Yes  Pain : No/denies pain Pain Score: 0-No pain     Nutritional Status: BMI 25 -29 Overweight Nutritional Risks: None Diabetes: No  How often do you need to have someone help you when you read instructions, pamphlets, or other written materials from your doctor or pharmacy?: 1 - Never  Interpreter Needed?: No  Comments: pt lives with spouse Information entered by :: LPinson, LPN  History reviewed. No pertinent past medical history. Past Surgical History:  Procedure Laterality Date  . ACHILLES TENDON REPAIR  06/14/2004  . CESAREAN SECTION     placenta previa  . CHOLECYSTECTOMY  06/13/2001   Family History  Problem Relation Age of Onset  . Heart disease Mother   . Dementia Mother   . Cancer Father        Pancreatic  . Stroke Father   . Breast cancer Paternal Grandmother 38   Social History   Socioeconomic History  . Marital status: Married    Spouse name: None  . Number of children:  1  . Years of education: None  . Highest education level: None  Social Needs  . Financial resource strain: None  . Food insecurity - worry: None  . Food insecurity - inability: None  . Transportation needs - medical: None  . Transportation needs - non-medical: None  Occupational History  . None  Tobacco Use  . Smoking status: Never Smoker  . Smokeless tobacco: Never Used  Substance and Sexual Activity  . Alcohol use: Yes  . Drug use: None  . Sexual activity: Yes  Other Topics Concern  . None  Social History Narrative   Husband Tom, aware of her end of life wishes.  She would desire CPR, no feedings tubes.    Outpatient Encounter Medications as of 01/13/2017  Medication Sig  . ibuprofen (ADVIL,MOTRIN) 200 MG tablet Use as directed   . Multiple Vitamin (MULTIVITAMIN) tablet Take 1 tablet by mouth daily.    . simvastatin (ZOCOR) 20 MG tablet Take 1 tablet (20 mg total) by mouth at bedtime.   No facility-administered encounter medications on file as of 01/13/2017.     Activities of Daily Living In your present state of health, do you have any difficulty performing the following activities: 01/13/2017  Hearing? N  Vision? N  Difficulty concentrating or making decisions? N  Walking or climbing stairs? N  Dressing or bathing? N  Doing errands, shopping?  N  Preparing Food and eating ? N  Using the Toilet? N  In the past six months, have you accidently leaked urine? N  Do you have problems with loss of bowel control? N  Managing your Medications? N  Managing your Finances? N  Housekeeping or managing your Housekeeping? N  Some recent data might be hidden    Patient Care Team: Lucille Passy, MD as PCP - General Regal, Tamala Fothergill, DPM as Consulting Physician (Podiatry) Leandrew Koyanagi, MD as Referring Physician (Ophthalmology) Oneta Rack, MD as Consulting Physician (Dermatology) Mardene Celeste., MD as Consulting Physician (Dentistry)    Assessment:   This  is a routine wellness examination for Verena.   Hearing Screening   125Hz  250Hz  500Hz  1000Hz  2000Hz  3000Hz  4000Hz  6000Hz  8000Hz   Right ear:   40 40 40  40    Left ear:   0 40 40  40    Vision Screening Comments: Last vision exam on Nov 15, 2016 with Dr. Wallace Going    Exercise Activities and Dietary recommendations Current Exercise Habits: Home exercise routine, Type of exercise: Other - see comments(tennis), Time (Minutes): > 60, Frequency (Times/Week): 3, Weekly Exercise (Minutes/Week): 0, Intensity: Moderate, Exercise limited by: None identified  Goals    . Increase physical activity     Starting 01/13/2017, I will continue to play tennis for 90-120 minutes 2-3 days per week.        Fall Risk Fall Risk  01/13/2017 01/07/2016 01/08/2015 01/01/2014  Falls in the past year? No No No No   Depression Screen PHQ 2/9 Scores 01/13/2017 01/07/2016 01/08/2015 01/01/2014  PHQ - 2 Score 0 0 0 0  PHQ- 9 Score 0 - - -     Cognitive Function MMSE - Mini Mental State Exam 01/13/2017 01/07/2016  Orientation to time 5 5  Orientation to Place 5 5  Registration 3 3  Attention/ Calculation 0 0  Recall 3 3  Language- name 2 objects 0 0  Language- repeat 1 1  Language- follow 3 step command 3 3  Language- read & follow direction 0 0  Write a sentence 0 0  Copy design 0 0  Total score 20 20     PLEASE NOTE: A Mini-Cog screen was completed. Maximum score is 20. A value of 0 denotes this part of [place] MMSE was not completed or the patient failed this part of the Mini-Cog screening.   Mini-Cog Screening Orientation to Time - Max 5 pts Orientation to Place - Max 5 pts Registration - Max 3 pts Recall - Max 3 pts Language Repeat - Max 1 pts Language Follow 3 Step Command - Max 3 pts     Immunization History  Administered Date(s) Administered  . Influenza Split 12/29/2010  . Influenza Whole 11/05/2007, 11/09/2009  . Influenza, High Dose Seasonal PF 10/02/2016  . Influenza-Unspecified  10/24/2012, 10/07/2013, 10/21/2015  . Pneumococcal Conjugate-13 01/01/2014  . Pneumococcal Polysaccharide-23 11/11/2010  . Td 10/13/2005  . Zoster 03/20/2007   Screening Tests Health Maintenance  Topic Date Due  . TETANUS/TDAP  10/12/2025 (Originally 10/14/2015)  . MAMMOGRAM  01/25/2017  . COLON CANCER SCREENING ANNUAL FOBT  02/01/2017  . INFLUENZA VACCINE  Completed  . DEXA SCAN  Completed  . Hepatitis C Screening  Completed  . PNA vac Low Risk Adult  Completed      Plan:     I have personally reviewed, addressed, and noted the following in the patient's chart:  A. Medical and social history  B. Use of alcohol, tobacco or illicit drugs  C. Current medications and supplements D. Functional ability and status E.  Nutritional status F.  Physical activity G. Advance directives H. List of other physicians I.  Hospitalizations, surgeries, and ER visits in previous 12 months J.  [place] to include hearing, vision, cognitive, depression L. Referrals and appointments - none  In addition, I have reviewed and discussed with patient certain preventive protocols, quality metrics, and best practice recommendations. A written personalized care plan for preventive services as well as general preventive health recommendations were provided to patient.  See attached scanned questionnaire for additional information.   Signed,   Lindell Noe, MHA, BS, LPN Health Coach

## 2017-01-13 NOTE — Progress Notes (Signed)
PCP notes:   Health maintenance:  No gaps identified.   Abnormal screenings:   Hearing - failed  Hearing Screening   125Hz  250Hz  500Hz  1000Hz  2000Hz  3000Hz  4000Hz  6000Hz  8000Hz   Right ear:   40 40 40  40    Left ear:   0 40 40  40     Patient concerns:   Medication refill request for Simvastatin. - Pt has 12 days remaining and uses mail order. Does not want to run out of medication.   Nurse concerns:  None  Next PCP appt:   01/19/17 @ 1215

## 2017-01-13 NOTE — Progress Notes (Signed)
I reviewed health advisor's note, was available for consultation, and agree with documentation and plan.  

## 2017-01-13 NOTE — Patient Instructions (Signed)
Natalie Sosa , Thank you for taking time to come for your Medicare Wellness Visit. I appreciate your ongoing commitment to your health goals. Please review the following plan we discussed and let me know if I can assist you in the future.   These are the goals we discussed: Goals    . Increase physical activity     Starting 01/13/2017, I will continue to play tennis for 90-120 minutes 2-3 days per week.        This is a list of the screening recommended for you and due dates:  Health Maintenance  Topic Date Due  . Tetanus Vaccine  10/12/2025*  . Mammogram  01/25/2017  . Stool Blood Test  02/01/2017  . Flu Shot  Completed  . DEXA scan (bone density measurement)  Completed  .  Hepatitis C: One time screening is recommended by Center for Disease Control  (CDC) for  adults born from [birth year] through 1965.   Completed  . Pneumonia vaccines  Completed  *Topic was postponed. The date shown is not the original due date.   Preventive Care for Adults  A healthy lifestyle and preventive care can promote health and wellness. Preventive health guidelines for adults include the following key practices.  . A routine yearly physical is a good way to check with your health care provider about your health and preventive screening. It is a chance to share any concerns and updates on your health and to receive a thorough exam.  . Visit your dentist for a routine exam and preventive care every 6 months. Brush your teeth twice a day and floss once a day. Good oral hygiene prevents tooth decay and gum disease.  . The frequency of eye exams is based on your age, health, family medical history, use  of contact lenses, and other factors. Follow your health care provider's recommendations for frequency of eye exams.  . Eat a healthy diet. Foods like vegetables, fruits, whole grains, low-fat dairy products, and lean protein foods contain the nutrients you need without too many calories. Decrease your intake of foods  high in solid fats, added sugars, and salt. Eat the right amount of calories for you. Get information about a proper diet from your health care provider, if necessary.  . Regular physical exercise is one of the most important things you can do for your health. Most adults should get at least 150 minutes of moderate-intensity exercise (any activity that increases your heart rate and causes you to sweat) each week. In addition, most adults need muscle-strengthening exercises on 2 or more days a week.  Silver Sneakers may be a benefit available to you. To determine eligibility, you may visit the website: www.silversneakers.com or contact program at (905) 271-7030 Mon-Fri between 8AM-8PM.   . Maintain a healthy weight. The body mass index (BMI) is a screening tool to identify possible weight problems. It provides an estimate of body fat based on height and weight. Your health care provider can find your BMI and can help you achieve or maintain a healthy weight.   For adults 20 years and older: ? A BMI below 18.5 is considered underweight. ? A BMI of 18.5 to 24.9 is normal. ? A BMI of 25 to 29.9 is considered overweight. ? A BMI of 30 and above is considered obese.   . Maintain normal blood lipids and cholesterol levels by exercising and minimizing your intake of saturated fat. Eat a balanced diet with plenty of fruit and vegetables. Blood tests for  lipids and cholesterol should begin at age 62 and be repeated every 5 years. If your lipid or cholesterol levels are high, you are over 50, or you are at high risk for heart disease, you may need your cholesterol levels checked more frequently. Ongoing high lipid and cholesterol levels should be treated with medicines if diet and exercise are not working.  . If you smoke, find out from your health care provider how to quit. If you do not use tobacco, please do not start.  . If you choose to drink alcohol, please do not consume more than 2 drinks per day.  One drink is considered to be 12 ounces (355 mL) of beer, 5 ounces (148 mL) of wine, or 1.5 ounces (44 mL) of liquor.  . If you are 60-9 years old, ask your health care provider if you should take aspirin to prevent strokes.  . Use sunscreen. Apply sunscreen liberally and repeatedly throughout the day. You should seek shade when your shadow is shorter than you. Protect yourself by wearing long sleeves, pants, a wide-brimmed hat, and sunglasses year round, whenever you are outdoors.  . Once a month, do a whole body skin exam, using a mirror to look at the skin on your back. Tell your health care provider of new moles, moles that have irregular borders, moles that are larger than a pencil eraser, or moles that have changed in shape or color.

## 2017-01-13 NOTE — Progress Notes (Signed)
Pre visit review using our clinic review tool, if applicable. No additional management support is needed unless otherwise documented below in the visit note. 

## 2017-01-19 ENCOUNTER — Ambulatory Visit (INDEPENDENT_AMBULATORY_CARE_PROVIDER_SITE_OTHER): Payer: Medicare Other | Admitting: Primary Care

## 2017-01-19 ENCOUNTER — Encounter: Payer: Medicare Other | Admitting: Family Medicine

## 2017-01-19 ENCOUNTER — Encounter: Payer: Self-pay | Admitting: Primary Care

## 2017-01-19 VITALS — BP 104/62 | HR 63 | Temp 98.3°F | Ht 67.85 in | Wt 166.0 lb

## 2017-01-19 DIAGNOSIS — Z01419 Encounter for gynecological examination (general) (routine) without abnormal findings: Secondary | ICD-10-CM | POA: Diagnosis not present

## 2017-01-19 DIAGNOSIS — Z1239 Encounter for other screening for malignant neoplasm of breast: Secondary | ICD-10-CM

## 2017-01-19 DIAGNOSIS — E2839 Other primary ovarian failure: Secondary | ICD-10-CM

## 2017-01-19 DIAGNOSIS — E7800 Pure hypercholesterolemia, unspecified: Secondary | ICD-10-CM

## 2017-01-19 MED ORDER — SIMVASTATIN 20 MG PO TABS: 20.0000 mg | ORAL_TABLET | Freq: Every day | ORAL | 3 refills | Status: DC

## 2017-01-19 NOTE — Patient Instructions (Signed)
Call the [facility] to schedule your mammogram and bone density tests.   Stop by the lab to pick up your stool cards.  Continue exercising. You should be getting 150 minutes of moderate intensity exercise weekly.  Ensure you are consuming 64 ounces of water daily.  Increase vegetables, fruit, whole grains.  Follow up in 1 year for your annual exam or sooner if needed.  It was a pleasure meeting you!   Preventive Care 71 Years and Older, Female Preventive care refers to lifestyle choices and visits with your health care provider that can promote health and wellness. What does preventive care include?  A yearly physical exam. This is also called an annual well check.  Dental exams once or twice a year.  Routine eye exams. Ask your health care provider how often you should have your eyes checked.  Personal lifestyle choices, including: ? Daily care of your teeth and gums. ? Regular physical activity. ? Eating a healthy diet. ? Avoiding tobacco and drug use. ? Limiting alcohol use. ? Practicing safe sex. ? Taking low-dose aspirin every day. ? Taking vitamin and mineral supplements as recommended by your health care provider. What happens during an annual well check? The services and screenings done by your health care provider during your annual well check will depend on your age, overall health, lifestyle risk factors, and family history of disease. Counseling Your health care provider may ask you questions about your:  Alcohol use.  Tobacco use.  Drug use.  Emotional well-being.  Home and relationship well-being.  Sexual activity.  Eating habits.  History of falls.  Memory and ability to understand (cognition).  Work and work Statistician.  Reproductive health.  Screening You may have the following tests or measurements:  Height, weight, and BMI.  Blood pressure.  Lipid and cholesterol levels. These may be checked every 5 years, or more  frequently if you are over 18 years old.  Skin check.  Lung cancer screening. You may have this screening every year starting at age 49 if you have a 30-pack-year history of smoking and currently smoke or have quit within the past 15 years.  Fecal occult blood test (FOBT) of the stool. You may have this test every year starting at age 65.  Flexible sigmoidoscopy or colonoscopy. You may have a sigmoidoscopy every 5 years or a colonoscopy every 10 years starting at age 12.  Hepatitis C blood test.  Hepatitis B blood test.  Sexually transmitted disease (STD) testing.  Diabetes screening. This is done by checking your blood sugar (glucose) after you have not eaten for a while (fasting). You may have this done every 1-3 years.  Bone density scan. This is done to screen for osteoporosis. You may have this done starting at age 67.  Mammogram. This may be done every 1-2 years. Talk to your health care provider about how often you should have regular mammograms.  Talk with your health care provider about your test results, treatment options, and if necessary, the need for more tests. Vaccines Your health care provider may recommend certain vaccines, such as:  Influenza vaccine. This is recommended every year.  Tetanus, diphtheria, and acellular pertussis (Tdap, Td) vaccine. You may need a Td booster every 10 years.  Varicella vaccine. You may need this if you have not been vaccinated.  Zoster vaccine. You may need this after age 45.  Measles, mumps, and rubella (MMR) vaccine. You may need at least one dose of MMR if you  were born in [birth year] or later. You may also need a second dose.  Pneumococcal 13-valent conjugate (PCV13) vaccine. One dose is recommended after age 60.  Pneumococcal polysaccharide (PPSV23) vaccine. One dose is recommended after age 41.  Meningococcal vaccine. You may need this if you have certain conditions.  Hepatitis A vaccine. You may need this if you have certain  conditions or if you travel or work in places where you may be exposed to hepatitis A.  Hepatitis B vaccine. You may need this if you have certain conditions or if you travel or work in places where you may be exposed to hepatitis B.  Haemophilus influenzae type b (Hib) vaccine. You may need this if you have certain conditions.  Talk to your health care provider about which screenings and vaccines you need and how often you need them. This information is not intended to replace advice given to you by your health care provider. Make sure you discuss any questions you have with your health care provider. Document Released: 02/06/2015 Document Revised: 09/30/2015 Document Reviewed: 11/11/2014 Elsevier Interactive Patient Education  [employer].

## 2017-01-19 NOTE — Assessment & Plan Note (Signed)
Recent lipid panel stable. Continue simvastatin, refills sent to pharmacy.

## 2017-01-19 NOTE — Assessment & Plan Note (Signed)
Immunizations UTD. Mammogram and bone density due, pending. Fecal occult stool cards pending for cancer screening. Recommended to continue regular exercise, increase vegetables/fruit/whole grains. Exam unremarkable. Labs unremarkable. Follow up in year.

## 2017-01-19 NOTE — Progress Notes (Signed)
Subjective:    Patient ID: Natalie Sosa, female    DOB: April 19, 1945, 71 y.o.   MRN: [id number]  HPI  [name] is a 71 year old female who presents today to transfer care from Dr. Deborra Medina and also for complete physical.  Immunizations: -Tetanus: Completed in 2007, insurance will not cover. -Influenza: completed this season -Pneumonia: Completed Prevnar in 2015, Completed Pneumovax in 2012 -Shingles: Completed Zoster in 2009.  Diet: She endorses a healthy diet. Breakfast: Fruit smoothie, oatmeal Lunch: Soup, sandwich Dinner: Chicken, some beef, vegetables, starch Snacks: Occasionally, chocolate  Desserts: Once to twice weekly Beverages: Black coffee, soda, diet tea, water  Exercise: She plays tennis 2-3 times weekly. Eye exam: Completed in 2018 Dental exam: Completes semi-annually Colonoscopy: Never completed. Typically does fecal occult testing, negative. Dexa: Completed several years ago. Due. Mammogram: Completed in January 2018, due in early 2019.  1) Hyperlipidemia: Currently managed on Simvastatin 20 mg. Recent lipid panel with TC of 147, LDL of 67, HDL of 65. She denies myalgias. She is needing refills today.  Review of Systems  Constitutional: Negative for unexpected weight change.  HENT: Negative for rhinorrhea.   Respiratory: Negative for cough and shortness of breath.   Cardiovascular: Negative for chest pain.  Gastrointestinal: Negative for constipation and diarrhea.  Genitourinary: Negative for difficulty urinating and menstrual problem.  Musculoskeletal: Negative for arthralgias and myalgias.  Skin: Negative for rash.  Allergic/Immunologic: Negative for environmental allergies.  Neurological: Negative for dizziness, numbness and headaches.  Psychiatric/Behavioral:       Denies concerns for anxiety and depression       No past medical history on file.   Social History   Socioeconomic History  . Marital status: Married    Spouse name: Not on file  .  Number of children: 1  . Years of education: Not on file  . Highest education level: Not on file  Social Needs  . Financial resource strain: Not on file  . Food insecurity - worry: Not on file  . Food insecurity - inability: Not on file  . Transportation needs - medical: Not on file  . Transportation needs - non-medical: Not on file  Occupational History  . Not on file  Tobacco Use  . Smoking status: Never Smoker  . Smokeless tobacco: Never Used  Substance and Sexual Activity  . Alcohol use: Yes  . Drug use: Not on file  . Sexual activity: Yes  Other Topics Concern  . Not on file  Social History Narrative   Husband Natalie Sosa, aware of her end of life wishes.  She would desire CPR, no feedings tubes.    Past Surgical History:  Procedure Laterality Date  . ACHILLES TENDON REPAIR  06/14/2004  . CESAREAN SECTION     placenta previa  . CHOLECYSTECTOMY  06/13/2001    Family History  Problem Relation Age of Onset  . Heart disease Mother   . Dementia Mother   . Cancer Father        Pancreatic  . Stroke Father   . Breast cancer Paternal Grandmother 60    Allergies  Allergen Reactions  . Ezetimibe-Simvastatin     REACTION: Achey  . Hydrocodone     REACTION: Itchy, rash  . Prednisone     REACTION: anxiety, hyper    Current Outpatient Medications on File Prior to Visit  Medication Sig Dispense Refill  . ibuprofen (ADVIL,MOTRIN) 200 MG tablet Use as directed     . Multiple Vitamin (MULTIVITAMIN) tablet  Take 1 tablet by mouth daily.       No current facility-administered medications on file prior to visit.     BP 104/62   Pulse 63   Temp 98.3 F (36.8 C) (Oral)   Ht 5' 7.85" (1.723 m)   Wt 166 lb (75.3 kg)   SpO2 98%   BMI 25.35 kg/m    Objective:   Physical Exam  Constitutional: She is oriented to person, place, and time. She appears well-nourished.  HENT:  Right Ear: Tympanic membrane and ear canal normal.  Left Ear: Tympanic membrane and ear canal normal.    Nose: Nose normal.  Mouth/Throat: Oropharynx is clear and moist.  Eyes: Conjunctivae and EOM are normal. Pupils are equal, round, and reactive to light.  Neck: Neck supple. No thyromegaly present.  Cardiovascular: Normal rate and regular rhythm.  No murmur heard. Pulmonary/Chest: Effort normal and breath sounds normal. She has no rales.  Abdominal: Soft. Bowel sounds are normal. There is no tenderness.  Musculoskeletal: Normal range of motion.  Lymphadenopathy:    She has no cervical adenopathy.  Neurological: She is alert and oriented to person, place, and time. She has normal reflexes. No cranial nerve deficit.  Skin: Skin is warm and dry. No rash noted.  Psychiatric: She has a normal mood and affect.          Assessment & Plan:

## 2017-01-25 ENCOUNTER — Other Ambulatory Visit (INDEPENDENT_AMBULATORY_CARE_PROVIDER_SITE_OTHER): Payer: Medicare Other

## 2017-01-25 DIAGNOSIS — Z1211 Encounter for screening for malignant neoplasm of colon: Secondary | ICD-10-CM

## 2017-01-25 LAB — FECAL OCCULT BLOOD, IMMUNOCHEMICAL: FECAL OCCULT BLD: NEGATIVE

## 2017-02-21 ENCOUNTER — Ambulatory Visit
Admission: RE | Admit: 2017-02-21 | Discharge: 2017-02-21 | Disposition: A | Discharge status: Home / Self Care | Payer: Medicare Other | Source: Ambulatory Visit | Attending: Primary Care | Admitting: Primary Care

## 2017-02-21 DIAGNOSIS — Z1231 Encounter for screening mammogram for malignant neoplasm of breast: Secondary | ICD-10-CM | POA: Insufficient documentation

## 2017-02-21 DIAGNOSIS — E2839 Other primary ovarian failure: Secondary | ICD-10-CM | POA: Diagnosis not present

## 2017-02-21 DIAGNOSIS — Z1239 Encounter for other screening for malignant neoplasm of breast: Secondary | ICD-10-CM

## 2017-02-21 DIAGNOSIS — M8588 Other specified disorders of bone density and structure, other site: Secondary | ICD-10-CM | POA: Diagnosis not present

## 2017-02-21 DIAGNOSIS — M8589 Other specified disorders of bone density and structure, multiple sites: Secondary | ICD-10-CM | POA: Diagnosis not present

## 2017-02-21 DIAGNOSIS — Z780 Asymptomatic menopausal state: Secondary | ICD-10-CM | POA: Diagnosis not present

## 2017-04-19 DIAGNOSIS — M9905 Segmental and somatic dysfunction of pelvic region: Secondary | ICD-10-CM | POA: Diagnosis not present

## 2017-04-19 DIAGNOSIS — M5416 Radiculopathy, lumbar region: Secondary | ICD-10-CM | POA: Diagnosis not present

## 2017-04-19 DIAGNOSIS — M9903 Segmental and somatic dysfunction of lumbar region: Secondary | ICD-10-CM | POA: Diagnosis not present

## 2017-04-19 DIAGNOSIS — M955 Acquired deformity of pelvis: Secondary | ICD-10-CM | POA: Diagnosis not present

## 2017-04-20 DIAGNOSIS — M955 Acquired deformity of pelvis: Secondary | ICD-10-CM | POA: Diagnosis not present

## 2017-04-20 DIAGNOSIS — M9905 Segmental and somatic dysfunction of pelvic region: Secondary | ICD-10-CM | POA: Diagnosis not present

## 2017-04-20 DIAGNOSIS — M5416 Radiculopathy, lumbar region: Secondary | ICD-10-CM | POA: Diagnosis not present

## 2017-04-20 DIAGNOSIS — M9903 Segmental and somatic dysfunction of lumbar region: Secondary | ICD-10-CM | POA: Diagnosis not present

## 2017-04-24 DIAGNOSIS — M9905 Segmental and somatic dysfunction of pelvic region: Secondary | ICD-10-CM | POA: Diagnosis not present

## 2017-04-24 DIAGNOSIS — M955 Acquired deformity of pelvis: Secondary | ICD-10-CM | POA: Diagnosis not present

## 2017-04-24 DIAGNOSIS — M9903 Segmental and somatic dysfunction of lumbar region: Secondary | ICD-10-CM | POA: Diagnosis not present

## 2017-04-24 DIAGNOSIS — M5416 Radiculopathy, lumbar region: Secondary | ICD-10-CM | POA: Diagnosis not present

## 2017-04-26 DIAGNOSIS — M9903 Segmental and somatic dysfunction of lumbar region: Secondary | ICD-10-CM | POA: Diagnosis not present

## 2017-04-26 DIAGNOSIS — M955 Acquired deformity of pelvis: Secondary | ICD-10-CM | POA: Diagnosis not present

## 2017-04-26 DIAGNOSIS — M5416 Radiculopathy, lumbar region: Secondary | ICD-10-CM | POA: Diagnosis not present

## 2017-04-26 DIAGNOSIS — M9905 Segmental and somatic dysfunction of pelvic region: Secondary | ICD-10-CM | POA: Diagnosis not present

## 2017-04-27 DIAGNOSIS — M9905 Segmental and somatic dysfunction of pelvic region: Secondary | ICD-10-CM | POA: Diagnosis not present

## 2017-04-27 DIAGNOSIS — M9903 Segmental and somatic dysfunction of lumbar region: Secondary | ICD-10-CM | POA: Diagnosis not present

## 2017-04-27 DIAGNOSIS — M955 Acquired deformity of pelvis: Secondary | ICD-10-CM | POA: Diagnosis not present

## 2017-04-27 DIAGNOSIS — M5416 Radiculopathy, lumbar region: Secondary | ICD-10-CM | POA: Diagnosis not present

## 2017-05-08 DIAGNOSIS — M9903 Segmental and somatic dysfunction of lumbar region: Secondary | ICD-10-CM | POA: Diagnosis not present

## 2017-05-08 DIAGNOSIS — M9905 Segmental and somatic dysfunction of pelvic region: Secondary | ICD-10-CM | POA: Diagnosis not present

## 2017-05-08 DIAGNOSIS — M5416 Radiculopathy, lumbar region: Secondary | ICD-10-CM | POA: Diagnosis not present

## 2017-05-08 DIAGNOSIS — M955 Acquired deformity of pelvis: Secondary | ICD-10-CM | POA: Diagnosis not present

## 2017-05-10 DIAGNOSIS — M9903 Segmental and somatic dysfunction of lumbar region: Secondary | ICD-10-CM | POA: Diagnosis not present

## 2017-05-10 DIAGNOSIS — M5416 Radiculopathy, lumbar region: Secondary | ICD-10-CM | POA: Diagnosis not present

## 2017-05-10 DIAGNOSIS — M9905 Segmental and somatic dysfunction of pelvic region: Secondary | ICD-10-CM | POA: Diagnosis not present

## 2017-05-10 DIAGNOSIS — M955 Acquired deformity of pelvis: Secondary | ICD-10-CM | POA: Diagnosis not present

## 2017-05-11 DIAGNOSIS — M5416 Radiculopathy, lumbar region: Secondary | ICD-10-CM | POA: Diagnosis not present

## 2017-05-11 DIAGNOSIS — M9903 Segmental and somatic dysfunction of lumbar region: Secondary | ICD-10-CM | POA: Diagnosis not present

## 2017-05-11 DIAGNOSIS — M9905 Segmental and somatic dysfunction of pelvic region: Secondary | ICD-10-CM | POA: Diagnosis not present

## 2017-05-11 DIAGNOSIS — M955 Acquired deformity of pelvis: Secondary | ICD-10-CM | POA: Diagnosis not present

## 2017-05-15 DIAGNOSIS — M5416 Radiculopathy, lumbar region: Secondary | ICD-10-CM | POA: Diagnosis not present

## 2017-05-15 DIAGNOSIS — M9903 Segmental and somatic dysfunction of lumbar region: Secondary | ICD-10-CM | POA: Diagnosis not present

## 2017-05-15 DIAGNOSIS — M955 Acquired deformity of pelvis: Secondary | ICD-10-CM | POA: Diagnosis not present

## 2017-05-15 DIAGNOSIS — M9905 Segmental and somatic dysfunction of pelvic region: Secondary | ICD-10-CM | POA: Diagnosis not present

## 2017-05-16 DIAGNOSIS — H40003 Preglaucoma, unspecified, bilateral: Secondary | ICD-10-CM | POA: Diagnosis not present

## 2017-05-17 DIAGNOSIS — M955 Acquired deformity of pelvis: Secondary | ICD-10-CM | POA: Diagnosis not present

## 2017-05-17 DIAGNOSIS — M9903 Segmental and somatic dysfunction of lumbar region: Secondary | ICD-10-CM | POA: Diagnosis not present

## 2017-05-17 DIAGNOSIS — M9905 Segmental and somatic dysfunction of pelvic region: Secondary | ICD-10-CM | POA: Diagnosis not present

## 2017-05-17 DIAGNOSIS — M5416 Radiculopathy, lumbar region: Secondary | ICD-10-CM | POA: Diagnosis not present

## 2017-05-19 DIAGNOSIS — M955 Acquired deformity of pelvis: Secondary | ICD-10-CM | POA: Diagnosis not present

## 2017-05-19 DIAGNOSIS — M9903 Segmental and somatic dysfunction of lumbar region: Secondary | ICD-10-CM | POA: Diagnosis not present

## 2017-05-19 DIAGNOSIS — M9905 Segmental and somatic dysfunction of pelvic region: Secondary | ICD-10-CM | POA: Diagnosis not present

## 2017-05-19 DIAGNOSIS — M5416 Radiculopathy, lumbar region: Secondary | ICD-10-CM | POA: Diagnosis not present

## 2017-05-23 DIAGNOSIS — M9905 Segmental and somatic dysfunction of pelvic region: Secondary | ICD-10-CM | POA: Diagnosis not present

## 2017-05-23 DIAGNOSIS — M955 Acquired deformity of pelvis: Secondary | ICD-10-CM | POA: Diagnosis not present

## 2017-05-23 DIAGNOSIS — H40003 Preglaucoma, unspecified, bilateral: Secondary | ICD-10-CM | POA: Diagnosis not present

## 2017-05-23 DIAGNOSIS — M9903 Segmental and somatic dysfunction of lumbar region: Secondary | ICD-10-CM | POA: Diagnosis not present

## 2017-05-23 DIAGNOSIS — M5416 Radiculopathy, lumbar region: Secondary | ICD-10-CM | POA: Diagnosis not present

## 2017-05-25 DIAGNOSIS — M9903 Segmental and somatic dysfunction of lumbar region: Secondary | ICD-10-CM | POA: Diagnosis not present

## 2017-05-25 DIAGNOSIS — M5416 Radiculopathy, lumbar region: Secondary | ICD-10-CM | POA: Diagnosis not present

## 2017-05-25 DIAGNOSIS — M955 Acquired deformity of pelvis: Secondary | ICD-10-CM | POA: Diagnosis not present

## 2017-05-25 DIAGNOSIS — M9905 Segmental and somatic dysfunction of pelvic region: Secondary | ICD-10-CM | POA: Diagnosis not present

## 2017-05-31 DIAGNOSIS — M9903 Segmental and somatic dysfunction of lumbar region: Secondary | ICD-10-CM | POA: Diagnosis not present

## 2017-05-31 DIAGNOSIS — M955 Acquired deformity of pelvis: Secondary | ICD-10-CM | POA: Diagnosis not present

## 2017-05-31 DIAGNOSIS — M5416 Radiculopathy, lumbar region: Secondary | ICD-10-CM | POA: Diagnosis not present

## 2017-05-31 DIAGNOSIS — M9905 Segmental and somatic dysfunction of pelvic region: Secondary | ICD-10-CM | POA: Diagnosis not present

## 2017-06-07 DIAGNOSIS — M9905 Segmental and somatic dysfunction of pelvic region: Secondary | ICD-10-CM | POA: Diagnosis not present

## 2017-06-07 DIAGNOSIS — M5416 Radiculopathy, lumbar region: Secondary | ICD-10-CM | POA: Diagnosis not present

## 2017-06-07 DIAGNOSIS — M955 Acquired deformity of pelvis: Secondary | ICD-10-CM | POA: Diagnosis not present

## 2017-06-07 DIAGNOSIS — M9903 Segmental and somatic dysfunction of lumbar region: Secondary | ICD-10-CM | POA: Diagnosis not present

## 2017-06-13 DIAGNOSIS — M5416 Radiculopathy, lumbar region: Secondary | ICD-10-CM | POA: Diagnosis not present

## 2017-06-13 DIAGNOSIS — M955 Acquired deformity of pelvis: Secondary | ICD-10-CM | POA: Diagnosis not present

## 2017-06-13 DIAGNOSIS — M9903 Segmental and somatic dysfunction of lumbar region: Secondary | ICD-10-CM | POA: Diagnosis not present

## 2017-06-13 DIAGNOSIS — M9905 Segmental and somatic dysfunction of pelvic region: Secondary | ICD-10-CM | POA: Diagnosis not present

## 2017-06-28 DIAGNOSIS — M955 Acquired deformity of pelvis: Secondary | ICD-10-CM | POA: Diagnosis not present

## 2017-06-28 DIAGNOSIS — M5416 Radiculopathy, lumbar region: Secondary | ICD-10-CM | POA: Diagnosis not present

## 2017-06-28 DIAGNOSIS — M9905 Segmental and somatic dysfunction of pelvic region: Secondary | ICD-10-CM | POA: Diagnosis not present

## 2017-06-28 DIAGNOSIS — M9903 Segmental and somatic dysfunction of lumbar region: Secondary | ICD-10-CM | POA: Diagnosis not present

## 2017-07-12 DIAGNOSIS — M955 Acquired deformity of pelvis: Secondary | ICD-10-CM | POA: Diagnosis not present

## 2017-07-12 DIAGNOSIS — M9905 Segmental and somatic dysfunction of pelvic region: Secondary | ICD-10-CM | POA: Diagnosis not present

## 2017-07-12 DIAGNOSIS — M9903 Segmental and somatic dysfunction of lumbar region: Secondary | ICD-10-CM | POA: Diagnosis not present

## 2017-07-12 DIAGNOSIS — M5416 Radiculopathy, lumbar region: Secondary | ICD-10-CM | POA: Diagnosis not present

## 2017-08-02 DIAGNOSIS — M9903 Segmental and somatic dysfunction of lumbar region: Secondary | ICD-10-CM | POA: Diagnosis not present

## 2017-08-02 DIAGNOSIS — M955 Acquired deformity of pelvis: Secondary | ICD-10-CM | POA: Diagnosis not present

## 2017-08-02 DIAGNOSIS — M5416 Radiculopathy, lumbar region: Secondary | ICD-10-CM | POA: Diagnosis not present

## 2017-08-02 DIAGNOSIS — M9905 Segmental and somatic dysfunction of pelvic region: Secondary | ICD-10-CM | POA: Diagnosis not present

## 2017-08-30 DIAGNOSIS — M9903 Segmental and somatic dysfunction of lumbar region: Secondary | ICD-10-CM | POA: Diagnosis not present

## 2017-08-30 DIAGNOSIS — M5416 Radiculopathy, lumbar region: Secondary | ICD-10-CM | POA: Diagnosis not present

## 2017-08-30 DIAGNOSIS — M955 Acquired deformity of pelvis: Secondary | ICD-10-CM | POA: Diagnosis not present

## 2017-08-30 DIAGNOSIS — M9905 Segmental and somatic dysfunction of pelvic region: Secondary | ICD-10-CM | POA: Diagnosis not present

## 2017-09-27 DIAGNOSIS — M9905 Segmental and somatic dysfunction of pelvic region: Secondary | ICD-10-CM | POA: Diagnosis not present

## 2017-09-27 DIAGNOSIS — M5416 Radiculopathy, lumbar region: Secondary | ICD-10-CM | POA: Diagnosis not present

## 2017-09-27 DIAGNOSIS — M955 Acquired deformity of pelvis: Secondary | ICD-10-CM | POA: Diagnosis not present

## 2017-09-27 DIAGNOSIS — M9903 Segmental and somatic dysfunction of lumbar region: Secondary | ICD-10-CM | POA: Diagnosis not present

## 2017-10-25 DIAGNOSIS — M9903 Segmental and somatic dysfunction of lumbar region: Secondary | ICD-10-CM | POA: Diagnosis not present

## 2017-10-25 DIAGNOSIS — M5416 Radiculopathy, lumbar region: Secondary | ICD-10-CM | POA: Diagnosis not present

## 2017-10-25 DIAGNOSIS — M955 Acquired deformity of pelvis: Secondary | ICD-10-CM | POA: Diagnosis not present

## 2017-10-25 DIAGNOSIS — M9905 Segmental and somatic dysfunction of pelvic region: Secondary | ICD-10-CM | POA: Diagnosis not present

## 2017-11-20 DIAGNOSIS — H40003 Preglaucoma, unspecified, bilateral: Secondary | ICD-10-CM | POA: Diagnosis not present

## 2017-12-27 DIAGNOSIS — M9905 Segmental and somatic dysfunction of pelvic region: Secondary | ICD-10-CM | POA: Diagnosis not present

## 2017-12-27 DIAGNOSIS — M5416 Radiculopathy, lumbar region: Secondary | ICD-10-CM | POA: Diagnosis not present

## 2017-12-27 DIAGNOSIS — M955 Acquired deformity of pelvis: Secondary | ICD-10-CM | POA: Diagnosis not present

## 2017-12-27 DIAGNOSIS — M9903 Segmental and somatic dysfunction of lumbar region: Secondary | ICD-10-CM | POA: Diagnosis not present

## 2018-01-07 ENCOUNTER — Other Ambulatory Visit: Payer: Self-pay | Admitting: Primary Care

## 2018-01-07 DIAGNOSIS — E7800 Pure hypercholesterolemia, unspecified: Secondary | ICD-10-CM

## 2018-01-08 ENCOUNTER — Other Ambulatory Visit: Payer: Self-pay | Admitting: Primary Care

## 2018-01-08 DIAGNOSIS — Z1231 Encounter for screening mammogram for malignant neoplasm of breast: Secondary | ICD-10-CM

## 2018-01-11 DIAGNOSIS — D485 Neoplasm of uncertain behavior of skin: Secondary | ICD-10-CM | POA: Diagnosis not present

## 2018-01-11 DIAGNOSIS — C44719 Basal cell carcinoma of skin of left lower limb, including hip: Secondary | ICD-10-CM | POA: Diagnosis not present

## 2018-01-11 DIAGNOSIS — Z85828 Personal history of other malignant neoplasm of skin: Secondary | ICD-10-CM | POA: Diagnosis not present

## 2018-01-11 DIAGNOSIS — Z08 Encounter for follow-up examination after completed treatment for malignant neoplasm: Secondary | ICD-10-CM | POA: Diagnosis not present

## 2018-01-11 DIAGNOSIS — L570 Actinic keratosis: Secondary | ICD-10-CM | POA: Diagnosis not present

## 2018-01-12 ENCOUNTER — Other Ambulatory Visit: Payer: Self-pay | Admitting: Primary Care

## 2018-01-12 DIAGNOSIS — E7800 Pure hypercholesterolemia, unspecified: Secondary | ICD-10-CM

## 2018-01-19 ENCOUNTER — Ambulatory Visit: Payer: Medicare Other

## 2018-01-19 ENCOUNTER — Ambulatory Visit (INDEPENDENT_AMBULATORY_CARE_PROVIDER_SITE_OTHER): Payer: Medicare Other

## 2018-01-19 VITALS — BP 120/66 | HR 51 | Temp 98.7°F | Ht 67.5 in | Wt 164.5 lb

## 2018-01-19 DIAGNOSIS — Z0000 Encounter for general adult medical examination without abnormal findings: Secondary | ICD-10-CM

## 2018-01-19 DIAGNOSIS — E7800 Pure hypercholesterolemia, unspecified: Secondary | ICD-10-CM

## 2018-01-19 LAB — COMPREHENSIVE METABOLIC PANEL
ALK PHOS: 60 U/L (ref 39–117)
ALT: 12 U/L (ref 0–35)
AST: 17 U/L (ref 0–37)
Albumin: 4.3 g/dL (ref 3.5–5.2)
BUN: 19 mg/dL (ref 6–23)
CO2: 28 mEq/L (ref 19–32)
Calcium: 9.4 mg/dL (ref 8.4–10.5)
Chloride: 106 mEq/L (ref 96–112)
Creatinine, Ser: 0.86 mg/dL (ref 0.40–1.20)
GFR: 68.87 mL/min (ref >60.00)
Glucose, Bld: 92 mg/dL (ref 70–99)
Potassium: 4.2 mEq/L (ref 3.5–5.1)
Sodium: 141 mEq/L (ref 135–145)
TOTAL PROTEIN: 6.5 g/dL (ref 6.0–8.3)
Total Bilirubin: 0.7 mg/dL (ref 0.2–1.2)

## 2018-01-19 LAB — LIPID PANEL
Cholesterol: 167 mg/dL (ref 0–200)
HDL: 60.4 mg/dL (ref >39.00)
LDL Cholesterol: 83 mg/dL (ref 0–99)
NonHDL: 106.28
Total CHOL/HDL Ratio: 3
Triglycerides: 116 mg/dL (ref 0.0–149.0)
VLDL: 23.2 mg/dL (ref 0.0–40.0)

## 2018-01-19 NOTE — Progress Notes (Signed)
PCP notes:   Health maintenance:  Colon cancer screening - pt will obtain FOBT kit at next appt Mammogram - future appt scheduled  Abnormal screenings:   None  Patient concerns:   None  Nurse concerns:  None  Next PCP appt:   01/26/18 @ 0840  I reviewed health advisor's note, was available for consultation, and agree with documentation and plan. Loura Pardon MD

## 2018-01-19 NOTE — Patient Instructions (Signed)
Natalie Sosa , Thank you for taking time to come for your Medicare Wellness Visit. I appreciate your ongoing commitment to your health goals. Please review the following plan we discussed and let me know if I can assist you in the future.   These are the goals we discussed: Goals    . Increase physical activity     Starting 01/19/2018, I will continue to play tennis for at least 90 minutes 3-4 days per week.        This is a list of the screening recommended for you and due dates:  Health Maintenance  Topic Date Due  . Tetanus Vaccine  10/12/2025*  . Colon Cancer Screening  01/06/2026*  . Stool Blood Test  01/25/2018  . Mammogram  02/21/2018  . Flu Shot  Completed  . DEXA scan (bone density measurement)  Completed  .  Hepatitis C: One time screening is recommended by Center for Disease Control  (CDC) for  adults born from [birth year] through 1965.   Completed  . Pneumonia vaccines  Completed  *Topic was postponed. The date shown is not the original due date.   Preventive Care for Adults  A healthy lifestyle and preventive care can promote health and wellness. Preventive health guidelines for adults include the following key practices.  . A routine yearly physical is a good way to check with your health care provider about your health and preventive screening. It is a chance to share any concerns and updates on your health and to receive a thorough exam.  . Visit your dentist for a routine exam and preventive care every 6 months. Brush your teeth twice a day and floss once a day. Good oral hygiene prevents tooth decay and gum disease.  . The frequency of eye exams is based on your age, health, family medical history, use  of contact lenses, and other factors. Follow your health care provider's recommendations for frequency of eye exams.  . Eat a healthy diet. Foods like vegetables, fruits, whole grains, low-fat dairy products, and lean protein foods contain the nutrients you need without too  many calories. Decrease your intake of foods high in solid fats, added sugars, and salt. Eat the right amount of calories for you. Get information about a proper diet from your health care provider, if necessary.  . Regular physical exercise is one of the most important things you can do for your health. Most adults should get at least 150 minutes of moderate-intensity exercise (any activity that increases your heart rate and causes you to sweat) each week. In addition, most adults need muscle-strengthening exercises on 2 or more days a week.  Silver Sneakers may be a benefit available to you. To determine eligibility, you may visit the website: www.silversneakers.com or contact program at 409-542-1875 Mon-Fri between 8AM-8PM.   . Maintain a healthy weight. The body mass index (BMI) is a screening tool to identify possible weight problems. It provides an estimate of body fat based on height and weight. Your health care provider can find your BMI and can help you achieve or maintain a healthy weight.   For adults 20 years and older: ? A BMI below 18.5 is considered underweight. ? A BMI of 18.5 to 24.9 is normal. ? A BMI of 25 to 29.9 is considered overweight. ? A BMI of 30 and above is considered obese.   . Maintain normal blood lipids and cholesterol levels by exercising and minimizing your intake of saturated fat. Eat a balanced diet  with plenty of fruit and vegetables. Blood tests for lipids and cholesterol should begin at age 62 and be repeated every 5 years. If your lipid or cholesterol levels are high, you are over 50, or you are at high risk for heart disease, you may need your cholesterol levels checked more frequently. Ongoing high lipid and cholesterol levels should be treated with medicines if diet and exercise are not working.  . If you smoke, find out from your health care provider how to quit. If you do not use tobacco, please do not start.  . If you choose to drink alcohol, please  do not consume more than 2 drinks per day. One drink is considered to be 12 ounces (355 mL) of beer, 5 ounces (148 mL) of wine, or 1.5 ounces (44 mL) of liquor.  . If you are 33-37 years old, ask your health care provider if you should take aspirin to prevent strokes.  . Use sunscreen. Apply sunscreen liberally and repeatedly throughout the day. You should seek shade when your shadow is shorter than you. Protect yourself by wearing long sleeves, pants, a wide-brimmed hat, and sunglasses year round, whenever you are outdoors.  . Once a month, do a whole body skin exam, using a mirror to look at the skin on your back. Tell your health care provider of new moles, moles that have irregular borders, moles that are larger than a pencil eraser, or moles that have changed in shape or color.

## 2018-01-19 NOTE — Progress Notes (Signed)
Subjective:   Natalie Sosa is a 72 y.o. female who presents for Medicare Annual (Subsequent) preventive examination.  Review of Systems:  N/A Cardiac Risk Factors include: advanced age (>88men, >55 women);dyslipidemia     Objective:     Vitals: BP 120/66 (BP Location: Right Arm, Patient Position: Sitting, Cuff Size: Normal)   Pulse (!) 51   Temp 98.7 F (37.1 C) (Oral)   Ht 5' 7.5" (1.715 m) Comment: shoes  Wt 164 lb 8 oz (74.6 kg)   SpO2 99%   BMI 25.38 kg/m   Body mass index is 25.38 kg/m.  Advanced Directives 01/19/2018 01/13/2017 01/07/2016  Does Patient Have a Medical Advance Directive? Yes Yes Yes  Type of Paramedic of [place];Living will Living will;Healthcare Power of [place];Living will  Copy of [place] in Chart? No - copy requested No - copy requested No - copy requested    Tobacco Social History   Tobacco Use  Smoking Status Never Smoker  Smokeless Tobacco Never Used     Counseling given: No   Clinical Intake:  Pre-visit preparation completed: Yes  Pain : No/denies pain Pain Score: 0-No pain     Nutritional Status: BMI 25 -29 Overweight Nutritional Risks: None Diabetes: No  How often do you need to have someone help you when you read instructions, pamphlets, or other written materials from your doctor or pharmacy?: 1 - Never  Interpreter Needed?: No  Comments: pt lives with spouse Information entered by :: LPinson, LPN  History reviewed. No pertinent past medical history. Past Surgical History:  Procedure Laterality Date  . ACHILLES TENDON REPAIR  06/14/2004  . CESAREAN SECTION     placenta previa  . CHOLECYSTECTOMY  06/13/2001   Family History  Problem Relation Age of Onset  . Heart disease Mother   . Dementia Mother   . Cancer Father        Pancreatic  . Stroke Father   . Breast cancer Paternal Grandmother 29  . Breast cancer Paternal Aunt    Social  History   Socioeconomic History  . Marital status: Married    Spouse name: Not on file  . Number of children: 1  . Years of education: Not on file  . Highest education level: Not on file  Occupational History  . Not on file  Social Needs  . Financial resource strain: Not on file  . Food insecurity:    Worry: Not on file    Inability: Not on file  . Transportation needs:    Medical: Not on file    Non-medical: Not on file  Tobacco Use  . Smoking status: Never Smoker  . Smokeless tobacco: Never Used  Substance and Sexual Activity  . Alcohol use: Yes    Comment: glass of wine occ  . Drug use: Not on file  . Sexual activity: Yes  Lifestyle  . Physical activity:    Days per week: Not on file    Minutes per session: Not on file  . Stress: Not on file  Relationships  . Social connections:    Talks on phone: Not on file    Gets together: Not on file    Attends religious service: Not on file    Active member of club or organization: Not on file    Attends meetings of clubs or organizations: Not on file    Relationship status: Not on file  Other Topics Concern  . Not  on file  Social History Narrative   Husband Gershon Mussel, aware of her end of life wishes.  She would desire CPR, no feedings tubes.    Outpatient Encounter Medications as of 01/19/2018  Medication Sig  . ibuprofen (ADVIL,MOTRIN) 200 MG tablet Use as directed   . Multiple Vitamin (MULTIVITAMIN) tablet Take 1 tablet by mouth daily.    . simvastatin (ZOCOR) 20 MG tablet TAKE 1 TABLET(20 MG) BY MOUTH AT BEDTIME   No facility-administered encounter medications on file as of 01/19/2018.     Activities of Daily Living In your present state of health, do you have any difficulty performing the following activities: 01/19/2018  Hearing? N  Vision? N  Difficulty concentrating or making decisions? N  Walking or climbing stairs? N  Dressing or bathing? N  Doing errands, shopping? N  Preparing Food and eating ? N  Using  the Toilet? N  In the past six months, have you accidently leaked urine? N  Do you have problems with loss of bowel control? N  Managing your Medications? N  Managing your Finances? N  Housekeeping or managing your Housekeeping? N  Some recent data might be hidden    Patient Care Team: Pleas Koch, NP as PCP - General (Internal Medicine) Regal, Tamala Fothergill, DPM as Consulting Physician (Podiatry) Leandrew Koyanagi, MD as Referring Physician (Ophthalmology) Oneta Rack, MD as Consulting Physician (Dermatology) Mardene Celeste., MD as Consulting Physician (Dentistry)    Assessment:   This is a routine wellness examination for Natalie Sosa.   Hearing Screening   125Hz  250Hz  500Hz  1000Hz  2000Hz  3000Hz  4000Hz  6000Hz  8000Hz   Right ear:   40 40 40  40    Left ear:   40 40 40  40    Vision Screening Comments: Vision exam in October 2019 with Dr. Wallace Going   Exercise Activities and Dietary recommendations Current Exercise Habits: Home exercise routine, Type of exercise: Other - see comments(tennis), Time (Minutes): > 60, Frequency (Times/Week): 3, Weekly Exercise (Minutes/Week): 0, Intensity: Moderate, Exercise limited by: None identified  Goals    . Increase physical activity     Starting 01/19/2018, I will continue to play tennis for at least 90 minutes 3-4 days per week.        Fall Risk Fall Risk  01/19/2018 01/13/2017 01/07/2016 01/08/2015 01/01/2014  Falls in the past year? 0 No No No No   Depression Screen PHQ 2/9 Scores 01/19/2018 01/13/2017 01/07/2016 01/08/2015  PHQ - 2 Score 0 0 0 0  PHQ- 9 Score 0 0 - -     Cognitive Function MMSE - Mini Mental State Exam 01/19/2018 01/13/2017 01/07/2016  Orientation to time 5 5 5   Orientation to Place 5 5 5   Registration 3 3 3   Attention/ Calculation 0 0 0  Recall 3 3 3   Language- name 2 objects 0 0 0  Language- repeat 1 1 1   Language- follow 3 step command 3 3 3   Language- read & follow direction 0 0 0  Write a  sentence 0 0 0  Copy design 0 0 0  Total score 20 20 20      PLEASE NOTE: A Mini-Cog screen was completed. Maximum score is 20. A value of 0 denotes this part of [place] MMSE was not completed or the patient failed this part of the Mini-Cog screening.   Mini-Cog Screening Orientation to Time - Max 5 pts Orientation to Place - Max 5 pts Registration - Max 3 pts Recall - Max 3 pts  Language Repeat - Max 1 pts Language Follow 3 Step Command - Max 3 pts     Immunization History  Administered Date(s) Administered  . Influenza Split 12/29/2010  . Influenza Whole 11/05/2007, 11/09/2009  . Influenza, High Dose Seasonal PF 10/02/2016  . Influenza,inj,Quad PF,6+ Mos 10/04/2017  . Influenza-Unspecified 10/24/2012, 10/07/2013, 10/21/2015  . Pneumococcal Conjugate-13 01/01/2014  . Pneumococcal Polysaccharide-23 11/11/2010  . Td 10/13/2005  . Zoster 03/20/2007    Screening Tests Health Maintenance  Topic Date Due  . TETANUS/TDAP  10/12/2025 (Originally 10/14/2015)  . COLONOSCOPY  01/06/2026 (Originally 09/18/1995)  . COLON CANCER SCREENING ANNUAL FOBT  01/25/2018  . MAMMOGRAM  02/21/2018  . INFLUENZA VACCINE  Completed  . DEXA SCAN  Completed  . Hepatitis C Screening  Completed  . PNA vac Low Risk Adult  Completed     Plan:     I have personally reviewed, addressed, and noted the following in the patient's chart:  A. Medical and social history B. Use of alcohol, tobacco or illicit drugs  C. Current medications and supplements D. Functional ability and status E.  Nutritional status F.  Physical activity G. Advance directives H. List of other physicians I.  Hospitalizations, surgeries, and ER visits in previous 12 months [place].  [place] to include hearing, vision, cognitive, depression L. Referrals and appointments - none  In addition, I have reviewed and discussed with patient certain preventive protocols, quality metrics, and best practice recommendations. A written  personalized care plan for preventive services as well as general preventive health recommendations were provided to patient.  See attached scanned questionnaire for additional information.   Signed,   Lindell Noe, MHA, BS, LPN Health Coach

## 2018-01-22 ENCOUNTER — Encounter: Payer: Medicare Other | Admitting: Primary Care

## 2018-01-26 ENCOUNTER — Ambulatory Visit (INDEPENDENT_AMBULATORY_CARE_PROVIDER_SITE_OTHER): Payer: Medicare Other | Admitting: Primary Care

## 2018-01-26 ENCOUNTER — Encounter: Payer: Self-pay | Admitting: Primary Care

## 2018-01-26 VITALS — BP 122/80 | HR 63 | Temp 98.1°F | Ht 67.25 in | Wt 165.0 lb

## 2018-01-26 DIAGNOSIS — E7800 Pure hypercholesterolemia, unspecified: Secondary | ICD-10-CM

## 2018-01-26 DIAGNOSIS — Z23 Encounter for immunization: Secondary | ICD-10-CM | POA: Diagnosis not present

## 2018-01-26 DIAGNOSIS — Z1239 Encounter for other screening for malignant neoplasm of breast: Secondary | ICD-10-CM

## 2018-01-26 DIAGNOSIS — Z0000 Encounter for general adult medical examination without abnormal findings: Secondary | ICD-10-CM | POA: Diagnosis not present

## 2018-01-26 DIAGNOSIS — Z1211 Encounter for screening for malignant neoplasm of colon: Secondary | ICD-10-CM

## 2018-01-26 MED ORDER — ZOSTER VAC RECOMB ADJUVANTED 50 MCG/0.5ML IM SUSR: 0.5000 mL | Freq: Once | INTRAMUSCULAR | 1 refills | Status: AC

## 2018-01-26 NOTE — Progress Notes (Signed)
Subjective:    Patient ID: Natalie Sosa, female    DOB: [birth year], 73 y.o.   MRN: [id number]  HPI  Natalie Sosa is a 73 year old female who presents today for complete physical. She has no complaints today. She saw our health advisor last week.  Immunizations: -Tetanus: Completed in 2007 -Influenza: Completed this season  -Pneumonia: Completed in 2015 -Shingles: Completed Zostavax in 2009  Diet: She endorses a healthy diet Breakfast: Fruit smoothie Lunch: Soup, sandwich; occasional chips.fries Dinner: Chicken, vegetables  Snacks: Occasionally chocolate  Desserts: 1-2 times weekly  Beverages: Coffee, hot tea, soda, wine 1-2 times weekly   Exercise: She is playing tennis 3-4 weekly  Eye exam: Completed in 2019 Dental exam: Completes semi-annually  Colonoscopy: Never completed. Completes IFOB annually  Dexa: Completed in 2019 Mammogram: Completed in 2019 Hep C Screen: Completed   Review of Systems  Constitutional: Negative for unexpected weight change.  HENT: Negative for rhinorrhea.   Respiratory: Negative for cough and shortness of breath.   Cardiovascular: Negative for chest pain.  Gastrointestinal: Negative for constipation and diarrhea.  Genitourinary: Negative for difficulty urinating and menstrual problem.  Musculoskeletal: Negative for arthralgias and myalgias.  Skin: Negative for rash.  Allergic/Immunologic: Negative for environmental allergies.  Neurological: Negative for dizziness, numbness and headaches.  Psychiatric/Behavioral: The patient is not nervous/anxious.        No past medical history on file.   Social History   Socioeconomic History  . Marital status: Married    Spouse name: Not on file  . Number of children: 1  . Years of education: Not on file  . Highest education level: Not on file  Occupational History  . Not on file  Social Needs  . Financial resource strain: Not on file  . Food insecurity:    Worry: Not on file    Inability: Not  on file  . Transportation needs:    Medical: Not on file    Non-medical: Not on file  Tobacco Use  . Smoking status: Never Smoker  . Smokeless tobacco: Never Used  Substance and Sexual Activity  . Alcohol use: Yes    Comment: glass of wine occ  . Drug use: Not on file  . Sexual activity: Yes  Lifestyle  . Physical activity:    Days per week: Not on file    Minutes per session: Not on file  . Stress: Not on file  Relationships  . Social connections:    Talks on phone: Not on file    Gets together: Not on file    Attends religious service: Not on file    Active member of club or organization: Not on file    Attends meetings of clubs or organizations: Not on file    Relationship status: Not on file  . Intimate partner violence:    Fear of current or ex partner: Not on file    Emotionally abused: Not on file    Physically abused: Not on file    Forced sexual activity: Not on file  Other Topics Concern  . Not on file  Social History Narrative   Husband Natalie Sosa, aware of her end of life wishes.  She would desire CPR, no feedings tubes.    Past Surgical History:  Procedure Laterality Date  . ACHILLES TENDON REPAIR  06/14/2004  . CESAREAN SECTION     placenta previa  . CHOLECYSTECTOMY  06/13/2001    Family History  Problem Relation Age of Onset  .  Heart disease Mother   . Dementia Mother   . Cancer Father        Pancreatic  . Stroke Father   . Breast cancer Paternal Grandmother 24  . Breast cancer Paternal Aunt     Allergies  Allergen Reactions  . Ezetimibe-Simvastatin     REACTION: Achey  . Hydrocodone     REACTION: Itchy, rash  . Prednisone     REACTION: anxiety, hyper    Current Outpatient Medications on File Prior to Visit  Medication Sig Dispense Refill  . ibuprofen (ADVIL,MOTRIN) 200 MG tablet Use as directed     . Multiple Vitamin (MULTIVITAMIN) tablet Take 1 tablet by mouth daily.      . simvastatin (ZOCOR) 20 MG tablet TAKE 1 TABLET(20 MG) BY MOUTH AT  BEDTIME 90 tablet 0   No current facility-administered medications on file prior to visit.     BP 122/80   Pulse 63   Temp 98.1 F (36.7 C) (Oral)   Ht 5' 7.25" (1.708 m)   Wt 165 lb (74.8 kg)   SpO2 98%   BMI 25.65 kg/m    Objective:   Physical Exam  Constitutional: She is oriented to person, place, and time. She appears well-nourished.  HENT:  Mouth/Throat: No oropharyngeal exudate.  Eyes: Pupils are equal, round, and reactive to light. EOM are normal.  Neck: Neck supple. No thyromegaly present.  Cardiovascular: Normal rate and regular rhythm.  Respiratory: Effort normal and breath sounds normal.  GI: Soft. Bowel sounds are normal. There is no abdominal tenderness.  Musculoskeletal: Normal range of motion.  Neurological: She is alert and oriented to person, place, and time.  Skin: Skin is warm and dry.  Psychiatric: She has a normal mood and affect.           Assessment & Plan:

## 2018-01-26 NOTE — Assessment & Plan Note (Signed)
Recent lipid panel stable. Continue simvastatin. 

## 2018-01-26 NOTE — Patient Instructions (Signed)
Take the Shingles vaccination to your pharmacy for administration.  Stop by the lab prior to leaving today. I will notify you of your results once received.   Continue exercising. You should be getting 150 minutes of moderate intensity exercise weekly.  Continue to work on a healthy diet.  Ensure you are consuming 64 ounces of water daily.  Continue to take calcium and vitamin D for bone health.   We will see you next year for your annual exam or sooner if needed.  It was a pleasure to see you today!   Preventive Care 73 Years and Older, Female Preventive care refers to lifestyle choices and visits with your health care provider that can promote health and wellness. What does preventive care include?  A yearly physical exam. This is also called an annual well check.  Dental exams once or twice a year.  Routine eye exams. Ask your health care provider how often you should have your eyes checked.  Personal lifestyle choices, including: ? Daily care of your teeth and gums. ? Regular physical activity. ? Eating a healthy diet. ? Avoiding tobacco and drug use. ? Limiting alcohol use. ? Practicing safe sex. ? Taking low-dose aspirin every day. ? Taking vitamin and mineral supplements as recommended by your health care provider. What happens during an annual well check? The services and screenings done by your health care provider during your annual well check will depend on your age, overall health, lifestyle risk factors, and family history of disease. Counseling Your health care provider may ask you questions about your:  Alcohol use.  Tobacco use.  Drug use.  Emotional well-being.  Home and relationship well-being.  Sexual activity.  Eating habits.  History of falls.  Memory and ability to understand (cognition).  Work and work Statistician.  Reproductive health.  Screening You may have the following tests or measurements:  Height, weight, and  BMI.  Blood pressure.  Lipid and cholesterol levels. These may be checked every 5 years, or more frequently if you are over 69 years old.  Skin check.  Lung cancer screening. You may have this screening every year starting at age 7 if you have a 30-pack-year history of smoking and currently smoke or have quit within the past 15 years.  Colorectal cancer screening. All adults should have this screening starting at age 5 and continuing until age 59. You will have tests every 1-10 years, depending on your results and the type of screening test. People at increased risk should start screening at an earlier age. Screening tests may include: ? Guaiac-based fecal occult blood testing. ? Fecal immunochemical test (FIT). ? Stool DNA test. ? Virtual colonoscopy. ? Sigmoidoscopy. During this test, a flexible tube with a tiny camera (sigmoidoscope) is used to examine your rectum and lower colon. The sigmoidoscope is inserted through your anus into your rectum and lower colon. ? Colonoscopy. During this test, a long, thin, flexible tube with a tiny camera (colonoscope) is used to examine your entire colon and rectum.  Hepatitis C blood test.  Hepatitis B blood test.  Sexually transmitted disease (STD) testing.  Diabetes screening. This is done by checking your blood sugar (glucose) after you have not eaten for a while (fasting). You may have this done every 1-3 years.  Bone density scan. This is done to screen for osteoporosis. You may have this done starting at age 29.  Mammogram. This may be done every 1-2 years. Talk to your health care provider about how  often you should have regular mammograms. Talk with your health care provider about your test results, treatment options, and if necessary, the need for more tests. Vaccines Your health care provider may recommend certain vaccines, such as:  Influenza vaccine. This is recommended every year.  Tetanus, diphtheria, and acellular pertussis  (Tdap, Td) vaccine. You may need a Td booster every 10 years.  Varicella vaccine. You may need this if you have not been vaccinated.  Zoster vaccine. You may need this after age 19.  Measles, mumps, and rubella (MMR) vaccine. You may need at least one dose of MMR if you were born in 1957 or later. You may also need a second dose.  Pneumococcal 13-valent conjugate (PCV13) vaccine. One dose is recommended after age 43.  Pneumococcal polysaccharide (PPSV23) vaccine. One dose is recommended after age 67.  Meningococcal vaccine. You may need this if you have certain conditions.  Hepatitis A vaccine. You may need this if you have certain conditions or if you travel or work in places where you may be exposed to hepatitis A.  Hepatitis B vaccine. You may need this if you have certain conditions or if you travel or work in places where you may be exposed to hepatitis B.  Haemophilus influenzae type b (Hib) vaccine. You may need this if you have certain conditions. Talk to your health care provider about which screenings and vaccines you need and how often you need them. This information is not intended to replace advice given to you by your health care provider. Make sure you discuss any questions you have with your health care provider. Document Released: 02/06/2015 Document Revised: 03/02/2017 Document Reviewed: 11/11/2014 Elsevier Interactive Patient Education  2019 [employer].

## 2018-01-26 NOTE — Assessment & Plan Note (Signed)
Immunizations UTD. Rx for Shingrix provided. Mammogram due, pending. Bone density scan UTD. Colon cancer screening due, she opts for IFOB testing annually. Declines colonoscopy. Commended her on regular exercise and a healthy diet. Exam unremarkable. Labs reviewed. Follow up in 1 year for CPE.

## 2018-01-30 ENCOUNTER — Other Ambulatory Visit (INDEPENDENT_AMBULATORY_CARE_PROVIDER_SITE_OTHER): Payer: Medicare Other

## 2018-01-30 DIAGNOSIS — Z1211 Encounter for screening for malignant neoplasm of colon: Secondary | ICD-10-CM | POA: Diagnosis not present

## 2018-01-30 LAB — FECAL OCCULT BLOOD, IMMUNOCHEMICAL: Fecal Occult Bld: NEGATIVE

## 2018-01-31 DIAGNOSIS — M5416 Radiculopathy, lumbar region: Secondary | ICD-10-CM | POA: Diagnosis not present

## 2018-01-31 DIAGNOSIS — M955 Acquired deformity of pelvis: Secondary | ICD-10-CM | POA: Diagnosis not present

## 2018-01-31 DIAGNOSIS — M9903 Segmental and somatic dysfunction of lumbar region: Secondary | ICD-10-CM | POA: Diagnosis not present

## 2018-01-31 DIAGNOSIS — M9905 Segmental and somatic dysfunction of pelvic region: Secondary | ICD-10-CM | POA: Diagnosis not present

## 2018-02-15 DIAGNOSIS — C44719 Basal cell carcinoma of skin of left lower limb, including hip: Secondary | ICD-10-CM | POA: Diagnosis not present

## 2018-02-26 ENCOUNTER — Ambulatory Visit
Admission: RE | Admit: 2018-02-26 | Discharge: 2018-02-26 | Disposition: A | Discharge status: Home / Self Care | Payer: Medicare Other | Source: Ambulatory Visit | Attending: Primary Care | Admitting: Primary Care

## 2018-02-26 DIAGNOSIS — Z1231 Encounter for screening mammogram for malignant neoplasm of breast: Secondary | ICD-10-CM

## 2018-02-26 HISTORY — DX: Malignant (primary) neoplasm, unspecified: C80.1

## 2018-02-28 DIAGNOSIS — M5416 Radiculopathy, lumbar region: Secondary | ICD-10-CM | POA: Diagnosis not present

## 2018-02-28 DIAGNOSIS — M9905 Segmental and somatic dysfunction of pelvic region: Secondary | ICD-10-CM | POA: Diagnosis not present

## 2018-02-28 DIAGNOSIS — M955 Acquired deformity of pelvis: Secondary | ICD-10-CM | POA: Diagnosis not present

## 2018-02-28 DIAGNOSIS — M9903 Segmental and somatic dysfunction of lumbar region: Secondary | ICD-10-CM | POA: Diagnosis not present

## 2018-03-28 DIAGNOSIS — M5416 Radiculopathy, lumbar region: Secondary | ICD-10-CM | POA: Diagnosis not present

## 2018-03-28 DIAGNOSIS — M9905 Segmental and somatic dysfunction of pelvic region: Secondary | ICD-10-CM | POA: Diagnosis not present

## 2018-03-28 DIAGNOSIS — M955 Acquired deformity of pelvis: Secondary | ICD-10-CM | POA: Diagnosis not present

## 2018-03-28 DIAGNOSIS — M9903 Segmental and somatic dysfunction of lumbar region: Secondary | ICD-10-CM | POA: Diagnosis not present

## 2018-04-02 ENCOUNTER — Other Ambulatory Visit: Payer: Self-pay | Admitting: Primary Care

## 2018-04-02 DIAGNOSIS — E7800 Pure hypercholesterolemia, unspecified: Secondary | ICD-10-CM

## 2018-04-25 DIAGNOSIS — M955 Acquired deformity of pelvis: Secondary | ICD-10-CM | POA: Diagnosis not present

## 2018-04-25 DIAGNOSIS — M5416 Radiculopathy, lumbar region: Secondary | ICD-10-CM | POA: Diagnosis not present

## 2018-04-25 DIAGNOSIS — M9903 Segmental and somatic dysfunction of lumbar region: Secondary | ICD-10-CM | POA: Diagnosis not present

## 2018-04-25 DIAGNOSIS — M9905 Segmental and somatic dysfunction of pelvic region: Secondary | ICD-10-CM | POA: Diagnosis not present

## 2018-05-23 DIAGNOSIS — M9905 Segmental and somatic dysfunction of pelvic region: Secondary | ICD-10-CM | POA: Diagnosis not present

## 2018-05-23 DIAGNOSIS — M5416 Radiculopathy, lumbar region: Secondary | ICD-10-CM | POA: Diagnosis not present

## 2018-05-23 DIAGNOSIS — M955 Acquired deformity of pelvis: Secondary | ICD-10-CM | POA: Diagnosis not present

## 2018-05-23 DIAGNOSIS — M9903 Segmental and somatic dysfunction of lumbar region: Secondary | ICD-10-CM | POA: Diagnosis not present

## 2018-06-20 DIAGNOSIS — M9905 Segmental and somatic dysfunction of pelvic region: Secondary | ICD-10-CM | POA: Diagnosis not present

## 2018-06-20 DIAGNOSIS — M955 Acquired deformity of pelvis: Secondary | ICD-10-CM | POA: Diagnosis not present

## 2018-06-20 DIAGNOSIS — M9903 Segmental and somatic dysfunction of lumbar region: Secondary | ICD-10-CM | POA: Diagnosis not present

## 2018-06-20 DIAGNOSIS — M5416 Radiculopathy, lumbar region: Secondary | ICD-10-CM | POA: Diagnosis not present

## 2018-06-27 DIAGNOSIS — D485 Neoplasm of uncertain behavior of skin: Secondary | ICD-10-CM | POA: Diagnosis not present

## 2018-06-27 DIAGNOSIS — D2262 Melanocytic nevi of left upper limb, including shoulder: Secondary | ICD-10-CM | POA: Diagnosis not present

## 2018-06-27 DIAGNOSIS — D2261 Melanocytic nevi of right upper limb, including shoulder: Secondary | ICD-10-CM | POA: Diagnosis not present

## 2018-06-27 DIAGNOSIS — D225 Melanocytic nevi of trunk: Secondary | ICD-10-CM | POA: Diagnosis not present

## 2018-06-27 DIAGNOSIS — D2272 Melanocytic nevi of left lower limb, including hip: Secondary | ICD-10-CM | POA: Diagnosis not present

## 2018-07-02 DIAGNOSIS — Z0120 Encounter for dental examination and cleaning without abnormal findings: Secondary | ICD-10-CM | POA: Diagnosis not present

## 2018-07-18 DIAGNOSIS — M955 Acquired deformity of pelvis: Secondary | ICD-10-CM | POA: Diagnosis not present

## 2018-07-18 DIAGNOSIS — M9905 Segmental and somatic dysfunction of pelvic region: Secondary | ICD-10-CM | POA: Diagnosis not present

## 2018-07-18 DIAGNOSIS — M5416 Radiculopathy, lumbar region: Secondary | ICD-10-CM | POA: Diagnosis not present

## 2018-07-18 DIAGNOSIS — M9903 Segmental and somatic dysfunction of lumbar region: Secondary | ICD-10-CM | POA: Diagnosis not present

## 2018-08-15 DIAGNOSIS — M9903 Segmental and somatic dysfunction of lumbar region: Secondary | ICD-10-CM | POA: Diagnosis not present

## 2018-08-15 DIAGNOSIS — M955 Acquired deformity of pelvis: Secondary | ICD-10-CM | POA: Diagnosis not present

## 2018-08-15 DIAGNOSIS — M9905 Segmental and somatic dysfunction of pelvic region: Secondary | ICD-10-CM | POA: Diagnosis not present

## 2018-08-15 DIAGNOSIS — M5416 Radiculopathy, lumbar region: Secondary | ICD-10-CM | POA: Diagnosis not present

## 2018-08-28 DIAGNOSIS — H40003 Preglaucoma, unspecified, bilateral: Secondary | ICD-10-CM | POA: Diagnosis not present

## 2018-09-12 DIAGNOSIS — M955 Acquired deformity of pelvis: Secondary | ICD-10-CM | POA: Diagnosis not present

## 2018-09-12 DIAGNOSIS — M5416 Radiculopathy, lumbar region: Secondary | ICD-10-CM | POA: Diagnosis not present

## 2018-09-12 DIAGNOSIS — M9903 Segmental and somatic dysfunction of lumbar region: Secondary | ICD-10-CM | POA: Diagnosis not present

## 2018-09-12 DIAGNOSIS — M9905 Segmental and somatic dysfunction of pelvic region: Secondary | ICD-10-CM | POA: Diagnosis not present

## 2018-10-02 ENCOUNTER — Other Ambulatory Visit: Payer: Self-pay | Admitting: Primary Care

## 2018-10-02 DIAGNOSIS — E7800 Pure hypercholesterolemia, unspecified: Secondary | ICD-10-CM

## 2018-10-10 DIAGNOSIS — M9903 Segmental and somatic dysfunction of lumbar region: Secondary | ICD-10-CM | POA: Diagnosis not present

## 2018-10-10 DIAGNOSIS — M955 Acquired deformity of pelvis: Secondary | ICD-10-CM | POA: Diagnosis not present

## 2018-10-10 DIAGNOSIS — M9905 Segmental and somatic dysfunction of pelvic region: Secondary | ICD-10-CM | POA: Diagnosis not present

## 2018-10-10 DIAGNOSIS — M5416 Radiculopathy, lumbar region: Secondary | ICD-10-CM | POA: Diagnosis not present

## 2018-10-25 ENCOUNTER — Ambulatory Visit (INDEPENDENT_AMBULATORY_CARE_PROVIDER_SITE_OTHER): Payer: Medicare Other

## 2018-10-25 DIAGNOSIS — Z23 Encounter for immunization: Secondary | ICD-10-CM

## 2018-11-07 DIAGNOSIS — M955 Acquired deformity of pelvis: Secondary | ICD-10-CM | POA: Diagnosis not present

## 2018-11-07 DIAGNOSIS — M9905 Segmental and somatic dysfunction of pelvic region: Secondary | ICD-10-CM | POA: Diagnosis not present

## 2018-11-07 DIAGNOSIS — M9903 Segmental and somatic dysfunction of lumbar region: Secondary | ICD-10-CM | POA: Diagnosis not present

## 2018-11-07 DIAGNOSIS — M5416 Radiculopathy, lumbar region: Secondary | ICD-10-CM | POA: Diagnosis not present

## 2018-12-05 DIAGNOSIS — M9903 Segmental and somatic dysfunction of lumbar region: Secondary | ICD-10-CM | POA: Diagnosis not present

## 2018-12-05 DIAGNOSIS — M955 Acquired deformity of pelvis: Secondary | ICD-10-CM | POA: Diagnosis not present

## 2018-12-05 DIAGNOSIS — M5416 Radiculopathy, lumbar region: Secondary | ICD-10-CM | POA: Diagnosis not present

## 2018-12-05 DIAGNOSIS — M9905 Segmental and somatic dysfunction of pelvic region: Secondary | ICD-10-CM | POA: Diagnosis not present

## 2019-01-02 DIAGNOSIS — M9905 Segmental and somatic dysfunction of pelvic region: Secondary | ICD-10-CM | POA: Diagnosis not present

## 2019-01-02 DIAGNOSIS — M5416 Radiculopathy, lumbar region: Secondary | ICD-10-CM | POA: Diagnosis not present

## 2019-01-02 DIAGNOSIS — M9903 Segmental and somatic dysfunction of lumbar region: Secondary | ICD-10-CM | POA: Diagnosis not present

## 2019-01-02 DIAGNOSIS — M955 Acquired deformity of pelvis: Secondary | ICD-10-CM | POA: Diagnosis not present

## 2019-01-14 ENCOUNTER — Other Ambulatory Visit: Payer: Self-pay | Admitting: Primary Care

## 2019-01-14 DIAGNOSIS — Z1231 Encounter for screening mammogram for malignant neoplasm of breast: Secondary | ICD-10-CM

## 2019-01-16 DIAGNOSIS — M5416 Radiculopathy, lumbar region: Secondary | ICD-10-CM | POA: Diagnosis not present

## 2019-01-16 DIAGNOSIS — M9903 Segmental and somatic dysfunction of lumbar region: Secondary | ICD-10-CM | POA: Diagnosis not present

## 2019-01-16 DIAGNOSIS — M955 Acquired deformity of pelvis: Secondary | ICD-10-CM | POA: Diagnosis not present

## 2019-01-16 DIAGNOSIS — M9905 Segmental and somatic dysfunction of pelvic region: Secondary | ICD-10-CM | POA: Diagnosis not present

## 2019-01-17 ENCOUNTER — Other Ambulatory Visit: Payer: Self-pay | Admitting: Primary Care

## 2019-01-17 DIAGNOSIS — E7800 Pure hypercholesterolemia, unspecified: Secondary | ICD-10-CM

## 2019-01-28 ENCOUNTER — Other Ambulatory Visit (INDEPENDENT_AMBULATORY_CARE_PROVIDER_SITE_OTHER): Payer: Medicare Other

## 2019-01-28 ENCOUNTER — Ambulatory Visit: Payer: Medicare Other

## 2019-01-28 ENCOUNTER — Other Ambulatory Visit: Payer: Self-pay

## 2019-01-28 ENCOUNTER — Ambulatory Visit (INDEPENDENT_AMBULATORY_CARE_PROVIDER_SITE_OTHER): Payer: Medicare Other

## 2019-01-28 VITALS — Wt 163.0 lb

## 2019-01-28 DIAGNOSIS — Z0000 Encounter for general adult medical examination without abnormal findings: Secondary | ICD-10-CM

## 2019-01-28 DIAGNOSIS — E7800 Pure hypercholesterolemia, unspecified: Secondary | ICD-10-CM | POA: Diagnosis not present

## 2019-01-28 LAB — COMPREHENSIVE METABOLIC PANEL
ALT: 14 U/L (ref 0–35)
AST: 21 U/L (ref 0–37)
Albumin: 4.5 g/dL (ref 3.5–5.2)
Alkaline Phosphatase: 57 U/L (ref 39–117)
BUN: 20 mg/dL (ref 6–23)
CO2: 26 mEq/L (ref 19–32)
Calcium: 9.6 mg/dL (ref 8.4–10.5)
Chloride: 104 mEq/L (ref 96–112)
Creatinine, Ser: 0.83 mg/dL (ref 0.40–1.20)
GFR: 67.32 mL/min (ref >60.00)
Glucose, Bld: 100 mg/dL — ABNORMAL HIGH (ref 70–99)
Potassium: 3.9 mEq/L (ref 3.5–5.1)
Sodium: 140 mEq/L (ref 135–145)
Total Bilirubin: 0.5 mg/dL (ref 0.2–1.2)
Total Protein: 6.8 g/dL (ref 6.0–8.3)

## 2019-01-28 LAB — LIPID PANEL
Cholesterol: 178 mg/dL (ref 0–200)
HDL: 67.6 mg/dL (ref >39.00)
LDL Cholesterol: 92 mg/dL (ref 0–99)
NonHDL: 110.33
Total CHOL/HDL Ratio: 3
Triglycerides: 90 mg/dL (ref 0.0–149.0)
VLDL: 18 mg/dL (ref 0.0–40.0)

## 2019-01-28 LAB — CBC
HCT: 39.8 % (ref 36.0–46.0)
Hemoglobin: 13.7 g/dL (ref 12.0–15.0)
MCHC: 34.4 g/dL (ref 30.0–36.0)
MCV: 91.2 fl (ref 78.0–100.0)
Platelets: 228 10*3/uL (ref 150.0–400.0)
RBC: 4.37 Mil/uL (ref 3.87–5.11)
RDW: 12.3 % (ref 11.5–15.5)
WBC: 5.6 10*3/uL (ref 4.0–10.5)

## 2019-01-28 NOTE — Patient Instructions (Signed)
[name] , Thank you for taking time to come for your Medicare Wellness Visit. I appreciate your ongoing commitment to your health goals. Please review the following plan we discussed and let me know if I can assist you in the future.   Screening recommendations/referrals: Colonoscopy: FOBT completed 01/30/2018 Mammogram: scheduled 02/28/2019 Bone Density: Up to date, completed 02/21/2017 Recommended yearly ophthalmology/optometry visit for glaucoma screening and checkup Recommended yearly dental visit for hygiene and checkup  Vaccinations: Influenza vaccine: Up to date, completed 10/25/2018 Pneumococcal vaccine: Completed series Tdap vaccine: decline Shingles vaccine: #1 completed 11/29/2018    Advanced directives: copy in chart  Conditions/risks identified: hypercholesterolemia  Next appointment: 01/31/2018 @ 7:40 am    Preventive Care 70 Years and Older, Female Preventive care refers to lifestyle choices and visits with your health care provider that can promote health and wellness. What does preventive care include?  A yearly physical exam. This is also called an annual well check.  Dental exams once or twice a year.  Routine eye exams. Ask your health care provider how often you should have your eyes checked.  Personal lifestyle choices, including:  Daily care of your teeth and gums.  Regular physical activity.  Eating a healthy diet.  Avoiding tobacco and drug use.  Limiting alcohol use.  Practicing safe sex.  Taking low-dose aspirin every day.  Taking vitamin and mineral supplements as recommended by your health care provider. What happens during an annual well check? The services and screenings done by your health care provider during your annual well check will depend on your age, overall health, lifestyle risk factors, and family history of disease. Counseling  Your health care provider may ask you questions about your:  Alcohol use.  Tobacco use.  Drug  use.  Emotional well-being.  Home and relationship well-being.  Sexual activity.  Eating habits.  History of falls.  Memory and ability to understand (cognition).  Work and work Statistician.  Reproductive health. Screening  You may have the following tests or measurements:  Height, weight, and BMI.  Blood pressure.  Lipid and cholesterol levels. These may be checked every 5 years, or more frequently if you are over 61 years old.  Skin check.  Lung cancer screening. You may have this screening every year starting at age 33 if you have a 30-pack-year history of smoking and currently smoke or have quit within the past 15 years.  Fecal occult blood test (FOBT) of the stool. You may have this test every year starting at age 34.  Flexible sigmoidoscopy or colonoscopy. You may have a sigmoidoscopy every 5 years or a colonoscopy every 10 years starting at age 27.  Hepatitis C blood test.  Hepatitis B blood test.  Sexually transmitted disease (STD) testing.  Diabetes screening. This is done by checking your blood sugar (glucose) after you have not eaten for a while (fasting). You may have this done every 1-3 years.  Bone density scan. This is done to screen for osteoporosis. You may have this done starting at age 19.  Mammogram. This may be done every 1-2 years. Talk to your health care provider about how often you should have regular mammograms. Talk with your health care provider about your test results, treatment options, and if necessary, the need for more tests. Vaccines  Your health care provider may recommend certain vaccines, such as:  Influenza vaccine. This is recommended every year.  Tetanus, diphtheria, and acellular pertussis (Tdap, Td) vaccine. You may need a Td booster  every 10 years.  Zoster vaccine. You may need this after age 33.  Pneumococcal 13-valent conjugate (PCV13) vaccine. One dose is recommended after age 48.  Pneumococcal polysaccharide  (PPSV23) vaccine. One dose is recommended after age 44. Talk to your health care provider about which screenings and vaccines you need and how often you need them. This information is not intended to replace advice given to you by your health care provider. Make sure you discuss any questions you have with your health care provider. Document Released: 02/06/2015 Document Revised: 09/30/2015 Document Reviewed: 11/11/2014 Elsevier Interactive Patient Education  2017 [employer] Prevention in the Home Falls can cause injuries. They can happen to people of all ages. There are many things you can do to make your home safe and to help prevent falls. What can I do on the outside of my home?  Regularly fix the edges of walkways and driveways and fix any cracks.  Remove anything that might make you trip as you walk through a door, such as a raised step or threshold.  Trim any bushes or trees on the path to your home.  Use bright outdoor lighting.  Clear any walking paths of anything that might make someone trip, such as rocks or tools.  Regularly check to see if handrails are loose or broken. Make sure that both sides of any steps have handrails.  Any raised decks and porches should have guardrails on the edges.  Have any leaves, snow, or ice cleared regularly.  Use sand or salt on walking paths during winter.  Clean up any spills in your garage right away. This includes oil or grease spills. What can I do in the bathroom?  Use night lights.  Install grab bars by the toilet and in the tub and shower. Do not use towel bars as grab bars.  Use non-skid mats or decals in the tub or shower.  If you need to sit down in the shower, use a plastic, non-slip stool.  Keep the floor dry. Clean up any water that spills on the floor as soon as it happens.  Remove soap buildup in the tub or shower regularly.  Attach bath mats securely with double-sided non-slip rug tape.  Do not have  throw rugs and other things on the floor that can make you trip. What can I do in the bedroom?  Use night lights.  Make sure that you have a light by your bed that is easy to reach.  Do not use any sheets or blankets that are too big for your bed. They should not hang down onto the floor.  Have a firm chair that has side arms. You can use this for support while you get dressed.  Do not have throw rugs and other things on the floor that can make you trip. What can I do in the kitchen?  Clean up any spills right away.  Avoid walking on wet floors.  Keep items that you use a lot in easy-to-reach places.  If you need to reach something above you, use a strong step stool that has a grab bar.  Keep electrical cords out of the way.  Do not use floor polish or wax that makes floors slippery. If you must use wax, use non-skid floor wax.  Do not have throw rugs and other things on the floor that can make you trip. What can I do with my stairs?  Do not leave any items on the stairs.  Make  sure that there are handrails on both sides of the stairs and use them. Fix handrails that are broken or loose. Make sure that handrails are as long as the stairways.  Check any carpeting to make sure that it is firmly attached to the stairs. Fix any carpet that is loose or worn.  Avoid having throw rugs at the top or bottom of the stairs. If you do have throw rugs, attach them to the floor with carpet tape.  Make sure that you have a light switch at the top of the stairs and the bottom of the stairs. If you do not have them, ask someone to add them for you. What else can I do to help prevent falls?  Wear shoes that:  Do not have high heels.  Have rubber bottoms.  Are comfortable and fit you well.  Are closed at the toe. Do not wear sandals.  If you use a stepladder:  Make sure that it is fully opened. Do not climb a closed stepladder.  Make sure that both sides of the stepladder are  locked into place.  Ask someone to hold it for you, if possible.  Clearly mark and make sure that you can see:  Any grab bars or handrails.  First and last steps.  Where the edge of each step is.  Use tools that help you move around (mobility aids) if they are needed. These include:  Canes.  Walkers.  Scooters.  Crutches.  Turn on the lights when you go into a dark area. Replace any light bulbs as soon as they burn out.  Set up your furniture so you have a clear path. Avoid moving your furniture around.  If any of your floors are uneven, fix them.  If there are any pets around you, be aware of where they are.  Review your medicines with your doctor. Some medicines can make you feel dizzy. This can increase your chance of falling. Ask your doctor what other things that you can do to help prevent falls. This information is not intended to replace advice given to you by your health care provider. Make sure you discuss any questions you have with your health care provider. Document Released: 11/06/2008 Document Revised: 06/18/2015 Document Reviewed: 02/14/2014 Elsevier Interactive Patient Education  2017 [employer].

## 2019-01-28 NOTE — Progress Notes (Signed)
PCP notes:  Health Maintenance: No gaps noted   Abnormal Screenings: none   Patient concerns: none   Nurse concerns: none   Next PCP appt.: 02/01/2019 @ 7:40 am

## 2019-01-28 NOTE — Progress Notes (Signed)
Subjective:   TANIYA BRUTUS is a 74 y.o. female who presents for Medicare Annual (Subsequent) preventive examination.  Review of Systems: N/A    This visit is being conducted through telemedicine via telephone at the nurse health advisor's home address due to the COVID-19 pandemic. This patient has given me verbal consent via doximity to conduct this visit, patient states they are participating from their home address. Patient and myself are on the telephone call. There is no referral for this visit. Some vital signs may be absent or patient reported.    Patient identification: identified by name, DOB, and current address   Cardiac Risk Factors include: advanced age (>62men, >20 women);Other (see comment), Risk factor comments: hypercholesterolemia     Objective:     Vitals: Wt 163 lb (73.9 kg)   BMI 25.34 kg/m   Body mass index is 25.34 kg/m.  Advanced Directives 01/28/2019 01/19/2018 01/13/2017 01/07/2016  Does Patient Have a Medical Advance Directive? Yes Yes Yes Yes  Type of Paramedic of [place];Living will [place];Living will Living will;Healthcare Power of [place];Living will  Copy of [place] in Chart? Yes - validated most recent copy scanned in chart (See row information) No - copy requested No - copy requested No - copy requested    Tobacco Social History   Tobacco Use  Smoking Status Never Smoker  Smokeless Tobacco Never Used     Counseling given: Not Answered   Clinical Intake:  Pre-visit preparation completed: Yes  Pain : No/denies pain     Nutritional Risks: None Diabetes: No  How often do you need to have someone help you when you read instructions, pamphlets, or other written materials from your doctor or pharmacy?: 1 - Never What is the last grade level you completed in school?: bachelors degree  Interpreter Needed?: No  Information entered by ::  CJohnson, LPN  Past Medical History:  Diagnosis Date  . Cancer [facility])    skin   Past Surgical History:  Procedure Laterality Date  . ACHILLES TENDON REPAIR  06/14/2004  . CESAREAN SECTION     placenta previa  . CHOLECYSTECTOMY  06/13/2001   Family History  Problem Relation Age of Onset  . Heart disease Mother   . Dementia Mother   . Cancer Father        Pancreatic  . Stroke Father   . Breast cancer Paternal Grandmother 48  . Breast cancer Paternal Aunt    Social History   Socioeconomic History  . Marital status: Married    Spouse name: Not on file  . Number of children: 1  . Years of education: Not on file  . Highest education level: Not on file  Occupational History  . Not on file  Tobacco Use  . Smoking status: Never Smoker  . Smokeless tobacco: Never Used  Substance and Sexual Activity  . Alcohol use: Yes    Comment: glass of wine occ  . Drug use: Never  . Sexual activity: Yes  Other Topics Concern  . Not on file  Social History Narrative   Husband Gershon Mussel, aware of her end of life wishes.  She would desire CPR, no feedings tubes.   Social Determinants of Health   Financial Resource Strain: Low Risk   . Difficulty of Paying Living Expenses: Not hard at all  Food Insecurity: No Food Insecurity  . Worried About Charity fundraiser in the Last Year: Never  true  . Ran Out of Food in the Last Year: Never true  Transportation Needs: No Transportation Needs  . Lack of Transportation (Medical): No  . Lack of Transportation (Non-Medical): No  Physical Activity: Sufficiently Active  . Days of Exercise per Week: 3 days  . Minutes of Exercise per Session: 90 min  Stress: No Stress Concern Present  . Feeling of Stress : Not at all  Social Connections:   . Frequency of Communication with Friends and Family: Not on file  . Frequency of Social Gatherings with Friends and Family: Not on file  . Attends Religious Services: Not on file  . Active Member of Clubs or  Organizations: Not on file  . Attends Archivist Meetings: Not on file  . Marital Status: Not on file    Outpatient Encounter Medications as of 01/28/2019  Medication Sig  . CALCIUM-VITAMIN D PO Take 2 tablets by mouth daily.  Marland Kitchen ibuprofen (ADVIL,MOTRIN) 200 MG tablet Use as directed   . Multiple Vitamin (MULTIVITAMIN) tablet Take 1 tablet by mouth daily.    . simvastatin (ZOCOR) 20 MG tablet TAKE 1 TABLET(20 MG) BY MOUTH AT BEDTIME   No facility-administered encounter medications on file as of 01/28/2019.    Activities of Daily Living In your present state of health, do you have any difficulty performing the following activities: 01/28/2019  Hearing? N  Vision? N  Difficulty concentrating or making decisions? N  Walking or climbing stairs? N  Dressing or bathing? N  Doing errands, shopping? N  Preparing Food and eating ? N  Using the Toilet? N  In the past six months, have you accidently leaked urine? N  Do you have problems with loss of bowel control? N  Managing your Medications? N  Managing your Finances? N  Housekeeping or managing your Housekeeping? N  Some recent data might be hidden    Patient Care Team: Pleas Koch, NP as PCP - General (Internal Medicine) Regal, Tamala Fothergill, DPM as Consulting Physician (Podiatry) Leandrew Koyanagi, MD as Referring Physician (Ophthalmology) Oneta Rack, MD as Consulting Physician (Dermatology) Mardene Celeste., MD as Consulting Physician (Dentistry)    Assessment:   This is a routine wellness examination for [place].  Exercise Activities and Dietary recommendations Current Exercise Habits: Home exercise routine, Type of exercise: Other - see comments(tennis), Time (Minutes): > 60, Frequency (Times/Week): 3, Weekly Exercise (Minutes/Week): 0, Intensity: Moderate, Exercise limited by: None identified  Goals    . Increase physical activity     Starting 01/19/2018, I will continue to play tennis for at least 90  minutes 3-4 days per week.     . Patient Stated     01/28/2019, I will maintain and continue medications as prescribed.        Fall Risk Fall Risk  01/28/2019 01/19/2018 01/13/2017 01/07/2016 01/08/2015  Falls in the past year? 0 0 No No No  Number falls in past yr: 0 - - - -  Injury with Fall? 0 - - - -  Follow up Falls evaluation completed;Falls prevention discussed - - - -   Is the patient's home free of loose throw rugs in walkways, pet beds, electrical cords, etc?   yes      Grab bars in the bathroom? no      Handrails on the stairs?   no      Adequate lighting?   yes  Timed Get Up and Go performed: N/A  Depression Screen PHQ 2/9 Scores 01/28/2019  01/19/2018 01/13/2017 01/07/2016  PHQ - 2 Score 0 0 0 0  PHQ- 9 Score 0 0 0 -     Cognitive Function MMSE - Mini Mental State Exam 01/28/2019 01/19/2018 01/13/2017 01/07/2016  Orientation to time 5 5 5 5   Orientation to Place 5 5 5 5   Registration 3 3 3 3   Attention/ Calculation 5 0 0 0  Recall 3 3 3 3   Language- name 2 objects - 0 0 0  Language- repeat 1 1 1 1   Language- follow 3 step command - 3 3 3   Language- read & follow direction - 0 0 0  Write a sentence - 0 0 0  Copy design - 0 0 0  Total score - 20 20 20   Mini Cog  Mini-Cog screen was completed. Maximum score is 22. A value of 0 denotes this part of the MMSE was not completed or the patient failed this part of the Mini-Cog screening.       Immunization History  Administered Date(s) Administered  . Fluad Quad(high Dose 65+) 10/25/2018  . Influenza Split 12/29/2010  . Influenza Whole 11/05/2007, 11/09/2009  . Influenza, High Dose Seasonal PF 10/02/2016  . Influenza,inj,Quad PF,6+ Mos 10/04/2017  . Influenza-Unspecified 10/24/2012, 10/07/2013, 10/21/2015  . Pneumococcal Conjugate-13 01/01/2014  . Pneumococcal Polysaccharide-23 11/11/2010  . Td 10/13/2005  . Zoster 03/20/2007  . Zoster Recombinat (Shingrix) 11/29/2018    Qualifies for Shingles Vaccine? #1  completed 11/29/2018  Screening Tests Health Maintenance  Topic Date Due  . TETANUS/TDAP  10/12/2025 (Originally 10/14/2015)  . COLONOSCOPY  01/06/2026 (Originally 09/18/1995)  . COLON CANCER SCREENING ANNUAL FOBT  01/31/2019  . MAMMOGRAM  02/27/2019  . INFLUENZA VACCINE  Completed  . DEXA SCAN  Completed  . Hepatitis C Screening  Completed  . PNA vac Low Risk Adult  Completed    Cancer Screenings: Lung: Low Dose CT Chest recommended if Age 66-80 years, 30 pack-year currently smoking OR have quit w/in 15years. Patient does not qualify. Breast:  Up to date on Mammogram? Yes, scheduled 02/28/2019   Up to date of Bone Density/Dexa? Yes, completed 02/21/2017 Colorectal: FOBT completed 01/30/2018  Additional Screenings:  Hepatitis C Screening: 01/07/2016     Plan:  Patient will maintain and continue medications as prescribed.   I have personally reviewed and noted the following in the patient's chart:   . Medical and social history . Use of alcohol, tobacco or illicit drugs  . Current medications and supplements . Functional ability and status . Nutritional status . Physical activity . Advanced directives . List of other physicians . Hospitalizations, surgeries, and ER visits in previous 12 months . Vitals . Screenings to include cognitive, depression, and falls . Referrals and appointments  In addition, I have reviewed and discussed with patient certain preventive protocols, quality metrics, and best practice recommendations. A written personalized care plan for preventive services as well as general preventive health recommendations were provided to patient.     Andrez Grime, LPN  [id number]

## 2019-01-30 DIAGNOSIS — M9903 Segmental and somatic dysfunction of lumbar region: Secondary | ICD-10-CM | POA: Diagnosis not present

## 2019-01-30 DIAGNOSIS — M5416 Radiculopathy, lumbar region: Secondary | ICD-10-CM | POA: Diagnosis not present

## 2019-01-30 DIAGNOSIS — M9905 Segmental and somatic dysfunction of pelvic region: Secondary | ICD-10-CM | POA: Diagnosis not present

## 2019-01-30 DIAGNOSIS — M955 Acquired deformity of pelvis: Secondary | ICD-10-CM | POA: Diagnosis not present

## 2019-02-01 ENCOUNTER — Encounter: Payer: Self-pay | Admitting: Primary Care

## 2019-02-01 ENCOUNTER — Ambulatory Visit (INDEPENDENT_AMBULATORY_CARE_PROVIDER_SITE_OTHER): Payer: Medicare Other | Admitting: Primary Care

## 2019-02-01 ENCOUNTER — Other Ambulatory Visit: Payer: Self-pay

## 2019-02-01 VITALS — BP 120/70 | HR 63 | Temp 97.5°F | Ht 67.25 in | Wt 166.2 lb

## 2019-02-01 DIAGNOSIS — E2839 Other primary ovarian failure: Secondary | ICD-10-CM

## 2019-02-01 DIAGNOSIS — Z0000 Encounter for general adult medical examination without abnormal findings: Secondary | ICD-10-CM | POA: Diagnosis not present

## 2019-02-01 DIAGNOSIS — E7800 Pure hypercholesterolemia, unspecified: Secondary | ICD-10-CM

## 2019-02-01 DIAGNOSIS — G4700 Insomnia, unspecified: Secondary | ICD-10-CM

## 2019-02-01 DIAGNOSIS — Z1211 Encounter for screening for malignant neoplasm of colon: Secondary | ICD-10-CM

## 2019-02-01 MED ORDER — SIMVASTATIN 20 MG PO TABS: ORAL_TABLET | ORAL | 3 refills | Status: DC

## 2019-02-01 NOTE — Progress Notes (Signed)
Subjective:    Patient ID: Natalie Sosa, female    DOB: 19-Dec-1945, 74 y.o.   MRN: [id number]  HPI  This visit occurred during the SARS-CoV-2 public health emergency.  Safety protocols were in place, including screening questions prior to the visit, additional usage of staff PPE, and extensive cleaning of exam room while observing appropriate contact time as indicated for disinfecting solutions.   Natalie Sosa is a 74 year old female who presents today for complete physical.  Immunizations: -Influenza: Completed this season  -Shingles: Completed Zostavax and first dose of Shingrix -Pneumonia: Completed Prevnar in 2015, Pneumovax in 2012  Diet: She endorses a healthy diet. Exercise: She is playing Tennis  Eye exam: Completed in 2020 Dental exam: Completed in 2020  Mammogram: Scheduled for February 2021 Dexa: Completed in 2019, osteopenia Colonoscopy: None. Completes fecal occult cards annually Hep C Screen: Negative in 2017  BP Readings from Last 3 Encounters:  02/01/19 120/70  01/26/18 122/80  01/19/18 120/66      Review of Systems  Constitutional: Negative for unexpected weight change.  HENT: Negative for rhinorrhea.   Respiratory: Negative for cough and shortness of breath.   Cardiovascular: Negative for chest pain.  Gastrointestinal: Negative for constipation and diarrhea.  Genitourinary: Negative for difficulty urinating.  Musculoskeletal: Positive for back pain. Negative for arthralgias and myalgias.       Chronic back pain, working with chiropractor   Skin: Negative for rash.  Allergic/Immunologic: Negative for environmental allergies.  Neurological: Negative for dizziness, numbness and headaches.  Psychiatric/Behavioral: The patient is not nervous/anxious.        Past Medical History:  Diagnosis Date  . Cancer ([place])    skin     Social History   Socioeconomic History  . Marital status: Married    Spouse name: Not on file  . Number of children: 1  .  Years of education: Not on file  . Highest education level: Not on file  Occupational History  . Not on file  Tobacco Use  . Smoking status: Never Smoker  . Smokeless tobacco: Never Used  Substance and Sexual Activity  . Alcohol use: Yes    Comment: glass of wine occ  . Drug use: Never  . Sexual activity: Yes  Other Topics Concern  . Not on file  Social History Narrative   Husband Gershon Mussel, aware of her end of life wishes.  She would desire CPR, no feedings tubes.   Social Determinants of Health   Financial Resource Strain: Low Risk   . Difficulty of Paying Living Expenses: Not hard at all  Food Insecurity: No Food Insecurity  . Worried About Charity fundraiser in the Last Year: Never true  . Ran Out of Food in the Last Year: Never true  Transportation Needs: No Transportation Needs  . Lack of Transportation (Medical): No  . Lack of Transportation (Non-Medical): No  Physical Activity: Sufficiently Active  . Days of Exercise per Week: 3 days  . Minutes of Exercise per Session: 90 min  Stress: No Stress Concern Present  . Feeling of Stress : Not at all  Social Connections:   . Frequency of Communication with Friends and Family: Not on file  . Frequency of Social Gatherings with Friends and Family: Not on file  . Attends Religious Services: Not on file  . Active Member of Clubs or Organizations: Not on file  . Attends Archivist Meetings: Not on file  . Marital Status: Not on file  Intimate Partner Violence: Not At Risk  . Fear of Current or Ex-Partner: No  . Emotionally Abused: No  . Physically Abused: No  . Sexually Abused: No    Past Surgical History:  Procedure Laterality Date  . ACHILLES TENDON REPAIR  06/14/2004  . CESAREAN SECTION     placenta previa  . CHOLECYSTECTOMY  06/13/2001    Family History  Problem Relation Age of Onset  . Heart disease Mother   . Dementia Mother   . Cancer Father        Pancreatic  . Stroke Father   . Breast cancer  Paternal Grandmother 41  . Breast cancer Paternal Aunt     Allergies  Allergen Reactions  . Ezetimibe-Simvastatin     REACTION: [place]  . Hydrocodone     REACTION: Itchy, rash  . Prednisone     REACTION: anxiety, hyper    Current Outpatient Medications on File Prior to Visit  Medication Sig Dispense Refill  . CALCIUM-VITAMIN D PO Take 2 tablets by mouth daily.    Marland Kitchen ibuprofen (ADVIL,MOTRIN) 200 MG tablet Use as directed     . Multiple Vitamin (MULTIVITAMIN) tablet Take 1 tablet by mouth daily.       No current facility-administered medications on file prior to visit.    BP 120/70   Pulse 63   Temp (!) 97.5 F (36.4 C) (Temporal)   Ht 5' 7.25" (1.708 m)   Wt 166 lb 4 oz (75.4 kg)   SpO2 97%   BMI 25.85 kg/m    Objective:   Physical Exam  Constitutional: She is oriented to person, place, and time. She appears well-nourished.  HENT:  Right Ear: Tympanic membrane and ear canal normal.  Left Ear: Tympanic membrane and ear canal normal.  Mouth/Throat: Oropharynx is clear and moist.  Eyes: Pupils are equal, round, and reactive to light. EOM are normal.  Cardiovascular: Normal rate and regular rhythm.  Respiratory: Effort normal and breath sounds normal.  GI: Soft. Bowel sounds are normal. There is no abdominal tenderness.  Musculoskeletal:        General: Normal range of motion.     Cervical back: Neck supple.  Neurological: She is alert and oriented to person, place, and time. No cranial nerve deficit.  Reflex Scores:      Patellar reflexes are 2+ on the right side and 2+ on the left side. Skin: Skin is warm and dry.  Psychiatric: She has a normal mood and affect.           Assessment & Plan:

## 2019-02-01 NOTE — Assessment & Plan Note (Signed)
Denies concerns.

## 2019-02-01 NOTE — Assessment & Plan Note (Signed)
LDL at goal on simvastatin 20 mg, continue same.

## 2019-02-01 NOTE — Assessment & Plan Note (Addendum)
Immunizations UTD, will get second Shingrix dose soon. Tetanus isn't covered by insurance. Mammogram due and scheduled. Bone density scan due, order placed. Fecal stool card pending.  Commended her on a healthy diet and regular exercise.  Exam today unremarkable. Labs reviewed.

## 2019-02-01 NOTE — Patient Instructions (Signed)
Stop by the lab prior to leaving today. I will notify you of your results once received.   Continue exercising. You should be getting 150 minutes of moderate intensity exercise weekly.  Continue to work on a healthy diet. Ensure you are consuming 64 ounces of water daily.  Complete both your bone density scan and mammogram.  It was a pleasure to see you today!   Preventive Care 74 Years and Older, Female Preventive care refers to lifestyle choices and visits with your health care provider that can promote health and wellness. This includes:  A yearly physical exam. This is also called an annual well check.  Regular dental and eye exams.  Immunizations.  Screening for certain conditions.  Healthy lifestyle choices, such as diet and exercise. What can I expect for my preventive care visit? Physical exam Your health care provider will check:  Height and weight. These may be used to calculate body mass index (BMI), which is a measurement that tells if you are at a healthy weight.  Heart rate and blood pressure.  Your skin for abnormal spots. Counseling Your health care provider may ask you questions about:  Alcohol, tobacco, and drug use.  Emotional well-being.  Home and relationship well-being.  Sexual activity.  Eating habits.  History of falls.  Memory and ability to understand (cognition).  Work and work Statistician.  Pregnancy and menstrual history. What immunizations do I need?  Influenza (flu) vaccine  This is recommended every year. Tetanus, diphtheria, and pertussis (Tdap) vaccine  You may need a Td booster every 10 years. Varicella (chickenpox) vaccine  You may need this vaccine if you have not already been vaccinated. Zoster (shingles) vaccine  You may need this after age 36. Pneumococcal conjugate (PCV13) vaccine  One dose is recommended after age 75. Pneumococcal polysaccharide (PPSV23) vaccine  One dose is recommended after age  74. Measles, mumps, and rubella (MMR) vaccine  You may need at least one dose of MMR if you were born in 1957 or later. You may also need a second dose. Meningococcal conjugate (MenACWY) vaccine  You may need this if you have certain conditions. Hepatitis A vaccine  You may need this if you have certain conditions or if you travel or work in places where you may be exposed to hepatitis A. Hepatitis B vaccine  You may need this if you have certain conditions or if you travel or work in places where you may be exposed to hepatitis B. Haemophilus influenzae type b (Hib) vaccine  You may need this if you have certain conditions. You may receive vaccines as individual doses or as more than one vaccine together in one shot (combination vaccines). Talk with your health care provider about the risks and benefits of combination vaccines. What tests do I need? Blood tests  Lipid and cholesterol levels. These may be checked every 5 years, or more frequently depending on your overall health.  Hepatitis C test.  Hepatitis B test. Screening  Lung cancer screening. You may have this screening every year starting at age 74 if you have a 30-pack-year history of smoking and currently smoke or have quit within the past 15 years.  Colorectal cancer screening. All adults should have this screening starting at age 45 and continuing until age 74 Your health care provider may recommend screening at age 88 if you are at increased risk. You will have tests every 1-10 years, depending on your results and the type of screening test.  Diabetes screening. This  is done by checking your blood sugar (glucose) after you have not eaten for a while (fasting). You may have this done every 1-74 years  Mammogram. This may be done every 1-2 years. Talk with your health care provider about how often you should have regular mammograms.  BRCA-related cancer screening. This may be done if you have a family history of  breast, ovarian, tubal, or peritoneal cancers. Other tests  Sexually transmitted disease (STD) testing.  Bone density scan. This is done to screen for osteoporosis. You may have this done starting at age 74 Follow these instructions at home: Eating and drinking  Eat a diet that includes fresh fruits and vegetables, whole grains, lean protein, and low-fat dairy products. Limit your intake of foods with high amounts of sugar, saturated fats, and salt.  Take vitamin and mineral supplements as recommended by your health care provider.  Do not drink alcohol if your health care provider tells you not to drink.  If you drink alcohol: ? Limit how much you have to 0-1 drink a day. ? Be aware of how much alcohol is in your drink. In the [place] one drink equals one 12 oz bottle of beer (355 mL), one 5 oz glass of wine (148 mL), or one 1 oz glass of hard liquor (44 mL). Lifestyle  Take daily care of your teeth and gums.  Stay active. Exercise for at least 30 minutes on 5 or more days each week.  Do not use any products that contain nicotine or tobacco, such as cigarettes, e-cigarettes, and chewing tobacco. If you need help quitting, ask your health care provider.  If you are sexually active, practice safe sex. Use a condom or other form of protection in order to prevent STIs (sexually transmitted infections).  Talk with your health care provider about taking a low-dose aspirin or statin. What's next?  Go to your health care provider once a year for a well check visit.  Ask your health care provider how often you should have your eyes and teeth checked.  Stay up to date on all vaccines. This information is not intended to replace advice given to you by your health care provider. Make sure you discuss any questions you have with your health care provider. Document Revised: 01/04/2018 Document Reviewed: 01/04/2018 Elsevier Patient Education  2020 [employer].

## 2019-02-04 ENCOUNTER — Other Ambulatory Visit (INDEPENDENT_AMBULATORY_CARE_PROVIDER_SITE_OTHER): Payer: Medicare Other

## 2019-02-04 DIAGNOSIS — Z1211 Encounter for screening for malignant neoplasm of colon: Secondary | ICD-10-CM | POA: Diagnosis not present

## 2019-02-04 LAB — FECAL OCCULT BLOOD, IMMUNOCHEMICAL: Fecal Occult Bld: NEGATIVE

## 2019-02-16 ENCOUNTER — Ambulatory Visit: Payer: Medicare Other | Attending: Internal Medicine

## 2019-02-16 DIAGNOSIS — Z23 Encounter for immunization: Secondary | ICD-10-CM | POA: Insufficient documentation

## 2019-02-16 NOTE — Progress Notes (Signed)
   Covid-19 Vaccination Clinic  Name:  Natalie Sosa    MRN: [id number] DOB: [birth year]  02/16/2019  Natalie Sosa was observed post Covid-19 immunization for 15 minutes without incidence. She was provided with Vaccine Information Sheet and instruction to access the V-Safe system.   Natalie Sosa was instructed to call 911 with any severe reactions post vaccine: Marland Kitchen Difficulty breathing  . Swelling of your face and throat  . A fast heartbeat  . A bad rash all over your body  . Dizziness and weakness    Immunizations Administered    Name Date Dose VIS Date Route   Pfizer COVID-19 Vaccine 02/16/2019  2:24 PM 0.3 mL 01/04/2019 Intramuscular   Manufacturer: [place]   Lot: [id number]   [place]: [id number]

## 2019-02-21 ENCOUNTER — Ambulatory Visit: Payer: Medicare Other

## 2019-02-25 DIAGNOSIS — H40003 Preglaucoma, unspecified, bilateral: Secondary | ICD-10-CM | POA: Diagnosis not present

## 2019-02-27 DIAGNOSIS — M9903 Segmental and somatic dysfunction of lumbar region: Secondary | ICD-10-CM | POA: Diagnosis not present

## 2019-02-27 DIAGNOSIS — M9905 Segmental and somatic dysfunction of pelvic region: Secondary | ICD-10-CM | POA: Diagnosis not present

## 2019-02-27 DIAGNOSIS — M5416 Radiculopathy, lumbar region: Secondary | ICD-10-CM | POA: Diagnosis not present

## 2019-02-27 DIAGNOSIS — M955 Acquired deformity of pelvis: Secondary | ICD-10-CM | POA: Diagnosis not present

## 2019-02-28 ENCOUNTER — Ambulatory Visit
Admission: RE | Admit: 2019-02-28 | Discharge: 2019-02-28 | Disposition: A | Discharge status: Home / Self Care | Payer: Medicare Other | Source: Ambulatory Visit | Attending: Primary Care | Admitting: Primary Care

## 2019-02-28 DIAGNOSIS — E2839 Other primary ovarian failure: Secondary | ICD-10-CM

## 2019-02-28 DIAGNOSIS — Z1231 Encounter for screening mammogram for malignant neoplasm of breast: Secondary | ICD-10-CM | POA: Insufficient documentation

## 2019-02-28 DIAGNOSIS — Z780 Asymptomatic menopausal state: Secondary | ICD-10-CM | POA: Diagnosis not present

## 2019-02-28 DIAGNOSIS — M8589 Other specified disorders of bone density and structure, multiple sites: Secondary | ICD-10-CM | POA: Diagnosis not present

## 2019-03-09 ENCOUNTER — Ambulatory Visit: Payer: Medicare Other | Attending: Internal Medicine

## 2019-03-09 DIAGNOSIS — Z23 Encounter for immunization: Secondary | ICD-10-CM

## 2019-03-09 NOTE — Progress Notes (Signed)
   Covid-19 Vaccination Clinic  Name:  Natalie Sosa    MRN: [id number] DOB: 1945-06-10  03/09/2019  [name] was observed post Covid-19 immunization for 15 minutes without incidence. She was provided with Vaccine Information Sheet and instruction to access the V-Safe system.   [name] was instructed to call 911 with any severe reactions post vaccine: Marland Kitchen Difficulty breathing  . Swelling of your face and throat  . A fast heartbeat  . A bad rash all over your body  . Dizziness and weakness    Immunizations Administered    Name Date Dose VIS Date Route   Pfizer COVID-19 Vaccine 03/09/2019 12:52 PM 0.3 mL 01/04/2019 Intramuscular   Manufacturer: [place]   Lot: [id number]   [place]: [id number]

## 2019-03-14 ENCOUNTER — Ambulatory Visit: Payer: Medicare Other

## 2019-03-27 DIAGNOSIS — M9903 Segmental and somatic dysfunction of lumbar region: Secondary | ICD-10-CM | POA: Diagnosis not present

## 2019-03-27 DIAGNOSIS — M5416 Radiculopathy, lumbar region: Secondary | ICD-10-CM | POA: Diagnosis not present

## 2019-03-27 DIAGNOSIS — M955 Acquired deformity of pelvis: Secondary | ICD-10-CM | POA: Diagnosis not present

## 2019-03-27 DIAGNOSIS — M9905 Segmental and somatic dysfunction of pelvic region: Secondary | ICD-10-CM | POA: Diagnosis not present

## 2019-04-10 ENCOUNTER — Other Ambulatory Visit: Payer: Self-pay | Admitting: Primary Care

## 2019-04-10 DIAGNOSIS — E7800 Pure hypercholesterolemia, unspecified: Secondary | ICD-10-CM

## 2019-04-24 DIAGNOSIS — M955 Acquired deformity of pelvis: Secondary | ICD-10-CM | POA: Diagnosis not present

## 2019-04-24 DIAGNOSIS — M5416 Radiculopathy, lumbar region: Secondary | ICD-10-CM | POA: Diagnosis not present

## 2019-04-24 DIAGNOSIS — M9905 Segmental and somatic dysfunction of pelvic region: Secondary | ICD-10-CM | POA: Diagnosis not present

## 2019-04-24 DIAGNOSIS — M9903 Segmental and somatic dysfunction of lumbar region: Secondary | ICD-10-CM | POA: Diagnosis not present

## 2019-05-29 DIAGNOSIS — M9905 Segmental and somatic dysfunction of pelvic region: Secondary | ICD-10-CM | POA: Diagnosis not present

## 2019-05-29 DIAGNOSIS — M9903 Segmental and somatic dysfunction of lumbar region: Secondary | ICD-10-CM | POA: Diagnosis not present

## 2019-05-29 DIAGNOSIS — M955 Acquired deformity of pelvis: Secondary | ICD-10-CM | POA: Diagnosis not present

## 2019-05-29 DIAGNOSIS — M5416 Radiculopathy, lumbar region: Secondary | ICD-10-CM | POA: Diagnosis not present

## 2019-06-26 DIAGNOSIS — M9903 Segmental and somatic dysfunction of lumbar region: Secondary | ICD-10-CM | POA: Diagnosis not present

## 2019-06-26 DIAGNOSIS — M9905 Segmental and somatic dysfunction of pelvic region: Secondary | ICD-10-CM | POA: Diagnosis not present

## 2019-06-26 DIAGNOSIS — M955 Acquired deformity of pelvis: Secondary | ICD-10-CM | POA: Diagnosis not present

## 2019-06-26 DIAGNOSIS — M5416 Radiculopathy, lumbar region: Secondary | ICD-10-CM | POA: Diagnosis not present

## 2019-07-17 DIAGNOSIS — Z0120 Encounter for dental examination and cleaning without abnormal findings: Secondary | ICD-10-CM | POA: Diagnosis not present

## 2019-07-30 DIAGNOSIS — M9905 Segmental and somatic dysfunction of pelvic region: Secondary | ICD-10-CM | POA: Diagnosis not present

## 2019-07-30 DIAGNOSIS — M955 Acquired deformity of pelvis: Secondary | ICD-10-CM | POA: Diagnosis not present

## 2019-07-30 DIAGNOSIS — M9903 Segmental and somatic dysfunction of lumbar region: Secondary | ICD-10-CM | POA: Diagnosis not present

## 2019-07-30 DIAGNOSIS — M5416 Radiculopathy, lumbar region: Secondary | ICD-10-CM | POA: Diagnosis not present

## 2019-07-31 DIAGNOSIS — Z08 Encounter for follow-up examination after completed treatment for malignant neoplasm: Secondary | ICD-10-CM | POA: Diagnosis not present

## 2019-07-31 DIAGNOSIS — L814 Other melanin hyperpigmentation: Secondary | ICD-10-CM | POA: Diagnosis not present

## 2019-07-31 DIAGNOSIS — D485 Neoplasm of uncertain behavior of skin: Secondary | ICD-10-CM | POA: Diagnosis not present

## 2019-07-31 DIAGNOSIS — Z85828 Personal history of other malignant neoplasm of skin: Secondary | ICD-10-CM | POA: Diagnosis not present

## 2019-08-27 DIAGNOSIS — M5416 Radiculopathy, lumbar region: Secondary | ICD-10-CM | POA: Diagnosis not present

## 2019-08-27 DIAGNOSIS — M9905 Segmental and somatic dysfunction of pelvic region: Secondary | ICD-10-CM | POA: Diagnosis not present

## 2019-08-27 DIAGNOSIS — M9903 Segmental and somatic dysfunction of lumbar region: Secondary | ICD-10-CM | POA: Diagnosis not present

## 2019-08-27 DIAGNOSIS — M955 Acquired deformity of pelvis: Secondary | ICD-10-CM | POA: Diagnosis not present

## 2019-09-03 DIAGNOSIS — H40003 Preglaucoma, unspecified, bilateral: Secondary | ICD-10-CM | POA: Diagnosis not present

## 2019-09-24 DIAGNOSIS — H40003 Preglaucoma, unspecified, bilateral: Secondary | ICD-10-CM | POA: Diagnosis not present

## 2019-09-25 DIAGNOSIS — M955 Acquired deformity of pelvis: Secondary | ICD-10-CM | POA: Diagnosis not present

## 2019-09-25 DIAGNOSIS — M5416 Radiculopathy, lumbar region: Secondary | ICD-10-CM | POA: Diagnosis not present

## 2019-09-25 DIAGNOSIS — M9903 Segmental and somatic dysfunction of lumbar region: Secondary | ICD-10-CM | POA: Diagnosis not present

## 2019-09-25 DIAGNOSIS — M9905 Segmental and somatic dysfunction of pelvic region: Secondary | ICD-10-CM | POA: Diagnosis not present

## 2019-10-23 DIAGNOSIS — R519 Headache, unspecified: Secondary | ICD-10-CM | POA: Diagnosis not present

## 2019-10-23 DIAGNOSIS — M9903 Segmental and somatic dysfunction of lumbar region: Secondary | ICD-10-CM | POA: Diagnosis not present

## 2019-10-23 DIAGNOSIS — M9901 Segmental and somatic dysfunction of cervical region: Secondary | ICD-10-CM | POA: Diagnosis not present

## 2019-10-23 DIAGNOSIS — M9905 Segmental and somatic dysfunction of pelvic region: Secondary | ICD-10-CM | POA: Diagnosis not present

## 2019-11-20 DIAGNOSIS — M9903 Segmental and somatic dysfunction of lumbar region: Secondary | ICD-10-CM | POA: Diagnosis not present

## 2019-11-20 DIAGNOSIS — M9901 Segmental and somatic dysfunction of cervical region: Secondary | ICD-10-CM | POA: Diagnosis not present

## 2019-11-20 DIAGNOSIS — R519 Headache, unspecified: Secondary | ICD-10-CM | POA: Diagnosis not present

## 2019-11-20 DIAGNOSIS — M9905 Segmental and somatic dysfunction of pelvic region: Secondary | ICD-10-CM | POA: Diagnosis not present

## 2019-12-16 DIAGNOSIS — M9901 Segmental and somatic dysfunction of cervical region: Secondary | ICD-10-CM | POA: Diagnosis not present

## 2019-12-16 DIAGNOSIS — M9903 Segmental and somatic dysfunction of lumbar region: Secondary | ICD-10-CM | POA: Diagnosis not present

## 2019-12-16 DIAGNOSIS — R519 Headache, unspecified: Secondary | ICD-10-CM | POA: Diagnosis not present

## 2019-12-16 DIAGNOSIS — M9905 Segmental and somatic dysfunction of pelvic region: Secondary | ICD-10-CM | POA: Diagnosis not present

## 2020-01-13 ENCOUNTER — Other Ambulatory Visit: Payer: Self-pay | Admitting: Primary Care

## 2020-01-13 DIAGNOSIS — E7800 Pure hypercholesterolemia, unspecified: Secondary | ICD-10-CM

## 2020-01-13 DIAGNOSIS — M9901 Segmental and somatic dysfunction of cervical region: Secondary | ICD-10-CM | POA: Diagnosis not present

## 2020-01-13 DIAGNOSIS — M9905 Segmental and somatic dysfunction of pelvic region: Secondary | ICD-10-CM | POA: Diagnosis not present

## 2020-01-13 DIAGNOSIS — R519 Headache, unspecified: Secondary | ICD-10-CM | POA: Diagnosis not present

## 2020-01-13 DIAGNOSIS — M9903 Segmental and somatic dysfunction of lumbar region: Secondary | ICD-10-CM | POA: Diagnosis not present

## 2020-01-13 DIAGNOSIS — Z1211 Encounter for screening for malignant neoplasm of colon: Secondary | ICD-10-CM

## 2020-01-15 ENCOUNTER — Other Ambulatory Visit: Payer: Self-pay | Admitting: Primary Care

## 2020-01-15 DIAGNOSIS — Z1231 Encounter for screening mammogram for malignant neoplasm of breast: Secondary | ICD-10-CM

## 2020-01-30 ENCOUNTER — Ambulatory Visit: Payer: Medicare Other

## 2020-01-31 ENCOUNTER — Other Ambulatory Visit: Payer: Self-pay

## 2020-01-31 ENCOUNTER — Other Ambulatory Visit (INDEPENDENT_AMBULATORY_CARE_PROVIDER_SITE_OTHER): Payer: Medicare Other

## 2020-01-31 DIAGNOSIS — E7800 Pure hypercholesterolemia, unspecified: Secondary | ICD-10-CM

## 2020-01-31 LAB — LIPID PANEL
Cholesterol: 160 mg/dL (ref 0–200)
HDL: 66.9 mg/dL (ref >39.00)
LDL Cholesterol: 78 mg/dL (ref 0–99)
NonHDL: 92.88
Total CHOL/HDL Ratio: 2
Triglycerides: 74 mg/dL (ref 0.0–149.0)
VLDL: 14.8 mg/dL (ref 0.0–40.0)

## 2020-01-31 LAB — COMPREHENSIVE METABOLIC PANEL
ALT: 15 U/L (ref 0–35)
AST: 22 U/L (ref 0–37)
Albumin: 4.7 g/dL (ref 3.5–5.2)
Alkaline Phosphatase: 54 U/L (ref 39–117)
BUN: 19 mg/dL (ref 6–23)
CO2: 28 mEq/L (ref 19–32)
Calcium: 9.9 mg/dL (ref 8.4–10.5)
Chloride: 104 mEq/L (ref 96–112)
Creatinine, Ser: 0.91 mg/dL (ref 0.40–1.20)
GFR: 62.21 mL/min (ref >60.00)
Glucose, Bld: 92 mg/dL (ref 70–99)
Potassium: 4 mEq/L (ref 3.5–5.1)
Sodium: 139 mEq/L (ref 135–145)
Total Bilirubin: 0.7 mg/dL (ref 0.2–1.2)
Total Protein: 6.9 g/dL (ref 6.0–8.3)

## 2020-02-03 ENCOUNTER — Other Ambulatory Visit: Payer: Self-pay

## 2020-02-03 ENCOUNTER — Ambulatory Visit (INDEPENDENT_AMBULATORY_CARE_PROVIDER_SITE_OTHER): Payer: Medicare Other | Admitting: Primary Care

## 2020-02-03 VITALS — BP 118/60 | HR 66 | Temp 97.6°F | Ht 67.25 in | Wt 169.0 lb

## 2020-02-03 DIAGNOSIS — G4700 Insomnia, unspecified: Secondary | ICD-10-CM | POA: Diagnosis not present

## 2020-02-03 DIAGNOSIS — Z0000 Encounter for general adult medical examination without abnormal findings: Secondary | ICD-10-CM

## 2020-02-03 DIAGNOSIS — E7800 Pure hypercholesterolemia, unspecified: Secondary | ICD-10-CM

## 2020-02-03 MED ORDER — SIMVASTATIN 20 MG PO TABS: ORAL_TABLET | ORAL | 3 refills | Status: DC

## 2020-02-03 NOTE — Assessment & Plan Note (Signed)
Well controlled on recent labs. Continue simvastatin 20 mg daily.

## 2020-02-03 NOTE — Progress Notes (Signed)
Subjective:    Patient ID: Natalie Sosa, female    DOB: December 17, 1945, 75 y.o.   MRN: [id number]  HPI  This visit occurred during the SARS-CoV-2 public health emergency.  Safety protocols were in place, including screening questions prior to the visit, additional usage of staff PPE, and extensive cleaning of exam room while observing appropriate contact time as indicated for disinfecting solutions.   Natalie Sosa is a 75 year old female who presents today for complete physical.  She would also like to discuss chronic insomnia, wakes during the night 2-3 times, sometimes it takes 1 hour to fall back asleep. She's tried Benadryl which helps. She does feel at times that she is "going through menopause at times" with mood swings. Notices that these mood swings occur when she's had little sleep.   Immunizations: -Tetanus: 2007 -Influenza: Completed this season  -Shingles: Shingrix and Zostavax completed -Pneumonia: Prevnar in 2015, pneumovax in 2012 -Covid-19: Completed three vaccines  Diet: She endorses a healthy diet.  Exercise: She is exercises three days weekly  Eye exam: Completes annually  Dental exam: Completes semi-annually   Mammogram: Scheduled Dexa: Completed in 2021 Colonoscopy: Declines, completes fecal occult cards annually   Hep C Screen: Negative  BP Readings from Last 3 Encounters:  02/03/20 118/60  02/01/19 120/70  01/26/18 122/80     Review of Systems  Constitutional: Negative for unexpected weight change.  HENT: Negative for rhinorrhea.   Eyes: Negative for visual disturbance.  Respiratory: Negative for cough and shortness of breath.   Cardiovascular: Negative for chest pain.  Gastrointestinal: Negative for constipation and diarrhea.  Genitourinary: Negative for difficulty urinating.  Musculoskeletal: Negative for arthralgias and myalgias.  Skin: Negative for rash.  Allergic/Immunologic: Negative for environmental allergies.  Neurological: Negative for  dizziness, numbness and headaches.  Psychiatric/Behavioral: Positive for sleep disturbance.       See HPI       Past Medical History:  Diagnosis Date  . Cancer ([place])    skin     Social History   Socioeconomic History  . Marital status: Married    Spouse name: Not on file  . Number of children: 1  . Years of education: Not on file  . Highest education level: Not on file  Occupational History  . Not on file  Tobacco Use  . Smoking status: Never Smoker  . Smokeless tobacco: Never Used  Vaping Use  . Vaping Use: Never used  Substance and Sexual Activity  . Alcohol use: Yes    Comment: glass of wine occ  . Drug use: Never  . Sexual activity: Yes  Other Topics Concern  . Not on file  Social History Narrative   Husband Gershon Mussel, aware of her end of life wishes.  She would desire CPR, no feedings tubes.   Social Determinants of Health   Financial Resource Strain: Not on file  Food Insecurity: Not on file  Transportation Needs: Not on file  Physical Activity: Not on file  Stress: Not on file  Social Connections: Not on file  Intimate Partner Violence: Not on file    Past Surgical History:  Procedure Laterality Date  . ACHILLES TENDON REPAIR  06/14/2004  . CESAREAN SECTION     placenta previa  . CHOLECYSTECTOMY  06/13/2001    Family History  Problem Relation Age of Onset  . Heart disease Mother   . Dementia Mother   . Cancer Father        Pancreatic  . Stroke  Father   . Breast cancer Paternal Grandmother 62  . Breast cancer Paternal Aunt     Allergies  Allergen Reactions  . Ezetimibe-Simvastatin     REACTION: Achey  . Hydrocodone     REACTION: Itchy, rash  . Prednisone     REACTION: anxiety, hyper    Current Outpatient Medications on File Prior to Visit  Medication Sig Dispense Refill  . CALCIUM-VITAMIN D PO Take 2 tablets by mouth daily.    Marland Kitchen ibuprofen (ADVIL,MOTRIN) 200 MG tablet Use as directed    . Multiple Vitamin (MULTIVITAMIN) tablet Take 1  tablet by mouth daily.     No current facility-administered medications on file prior to visit.    BP 118/60   Pulse 66   Temp 97.6 F (36.4 C) (Temporal)   Ht 5' 7.25" (1.708 m)   Wt 169 lb (76.7 kg)   SpO2 99%   BMI 26.27 kg/m    Objective:   Physical Exam Constitutional:      Appearance: She is well-nourished.  HENT:     Right Ear: Tympanic membrane and ear canal normal.     Left Ear: Tympanic membrane and ear canal normal.     Mouth/Throat:     Mouth: Oropharynx is clear and moist.  Eyes:     Extraocular Movements: EOM normal.     Pupils: Pupils are equal, round, and reactive to light.  Cardiovascular:     Rate and Rhythm: Normal rate and regular rhythm.  Pulmonary:     Effort: Pulmonary effort is normal.     Breath sounds: Normal breath sounds.  Abdominal:     General: Bowel sounds are normal.     Palpations: Abdomen is soft.     Tenderness: There is no abdominal tenderness.  Musculoskeletal:        General: Normal range of motion.     Cervical back: Neck supple.  Skin:    General: Skin is warm and dry.  Neurological:     Mental Status: She is alert and oriented to person, place, and time.     Cranial Nerves: No cranial nerve deficit.     Deep Tendon Reflexes:     Reflex Scores:      Patellar reflexes are 2+ on the right side and 2+ on the left side. Psychiatric:        Mood and Affect: Mood and affect and mood normal.            Assessment & Plan:

## 2020-02-03 NOTE — Patient Instructions (Signed)
Continue exercising. You should be getting 150 minutes of moderate intensity exercise weekly.  Continue to work on a healthy diet. Ensure you are consuming 64 ounces of water daily.  Try Melatonin 5 mg, take about 1 hour before bedtime.  You can try Unisom if Melatonin is ineffective.  It was a pleasure to see you today!   Preventive Care 75 Years and Older, Female Preventive care refers to lifestyle choices and visits with your health care provider that can promote health and wellness. This includes:  A yearly physical exam. This is also called an annual wellness visit.  Regular dental and eye exams.  Immunizations.  Screening for certain conditions.  Healthy lifestyle choices, such as: ? Eating a healthy diet. ? Getting regular exercise. ? Not using drugs or products that contain nicotine and tobacco. ? Limiting alcohol use. What can I expect for my preventive care visit? Physical exam Your health care provider will check your:  Height and weight. These may be used to calculate your BMI (body mass index). BMI is a measurement that tells if you are at a healthy weight.  Heart rate and blood pressure.  Body temperature.  Skin for abnormal spots. Counseling Your health care provider may ask you questions about your:  Past medical problems.  Family's medical history.  Alcohol, tobacco, and drug use.  Emotional well-being.  Home life and relationship well-being.  Sexual activity.  Diet, exercise, and sleep habits.  History of falls.  Memory and ability to understand (cognition).  Work and work Statistician.  Pregnancy and menstrual history.  Access to firearms. What immunizations do I need? Vaccines are usually given at various ages, according to a schedule. Your health care provider will recommend vaccines for you based on your age, medical history, and lifestyle or other factors, such as travel or where you work.   What tests do I need? Blood  tests  Lipid and cholesterol levels. These may be checked every 5 years, or more often depending on your overall health.  Hepatitis C test.  Hepatitis B test. Screening  Lung cancer screening. You may have this screening every year starting at age 75 if you have a 30-pack-year history of smoking and currently smoke or have quit within the past 15 years.  Colorectal cancer screening. ? All adults should have this screening starting at age 75 and continuing until age 27. ? Your health care provider may recommend screening at age 75 if you are at increased risk. ? You will have tests every 1-10 years, depending on your results and the type of screening test.  Diabetes screening. ? This is done by checking your blood sugar (glucose) after you have not eaten for a while (fasting). ? You may have this done every 1-3 years.  Mammogram. ? This may be done every 1-2 years. ? Talk with your health care provider about how often you should have regular mammograms.  Abdominal aortic aneurysm (AAA) screening. You may need this if you are a current or former smoker.  BRCA-related cancer screening. This may be done if you have a family history of breast, ovarian, tubal, or peritoneal cancers. Other tests  STD (sexually transmitted disease) testing, if you are at risk.  Bone density scan. This is done to screen for osteoporosis. You may have this done starting at age 35. Talk with your health care provider about your test results, treatment options, and if necessary, the need for more tests. Follow these instructions at home: Eating and drinking  Eat a diet that includes fresh fruits and vegetables, whole grains, lean protein, and low-fat dairy products. Limit your intake of foods with high amounts of sugar, saturated fats, and salt.  Take vitamin and mineral supplements as recommended by your health care provider.  Do not drink alcohol if your health care provider tells you not to  drink.  If you drink alcohol: ? Limit how much you have to 0-1 drink a day. ? Be aware of how much alcohol is in your drink. In the [place] one drink equals one 12 oz bottle of beer (355 mL), one 5 oz glass of wine (148 mL), or one 1 oz glass of hard liquor (44 mL).   Lifestyle  Take daily care of your teeth and gums. Brush your teeth every morning and night with fluoride toothpaste. Floss one time each day.  Stay active. Exercise for at least 30 minutes 5 or more days each week.  Do not use any products that contain nicotine or tobacco, such as cigarettes, e-cigarettes, and chewing tobacco. If you need help quitting, ask your health care provider.  Do not use drugs.  If you are sexually active, practice safe sex. Use a condom or other form of protection in order to prevent STIs (sexually transmitted infections).  Talk with your health care provider about taking a low-dose aspirin or statin.  Find healthy ways to cope with stress, such as: ? Meditation, yoga, or listening to music. ? Journaling. ? Talking to a trusted person. ? Spending time with friends and family. Safety  Always wear your seat belt while driving or riding in a vehicle.  Do not drive: ? If you have been drinking alcohol. Do not ride with someone who has been drinking. ? When you are tired or distracted. ? While texting.  Wear a helmet and other protective equipment during sports activities.  If you have firearms in your house, make sure you follow all gun safety procedures. What's next?  Visit your health care provider once a year for an annual wellness visit.  Ask your health care provider how often you should have your eyes and teeth checked.  Stay up to date on all vaccines. This information is not intended to replace advice given to you by your health care provider. Make sure you discuss any questions you have with your health care provider. Document Revised: 01/01/2020 Document Reviewed:  01/04/2018 Elsevier Patient Education  2021 [employer].

## 2020-02-03 NOTE — Assessment & Plan Note (Signed)
Ongoing, maintenance insomnia.  Discussed to start with OTC Melatonin 5 mg, then trial of Unisom if no improvement.   She will update if no improvement.  Consider Trazodone vs venlafaxine depending on if mood swings remain.

## 2020-02-03 NOTE — Assessment & Plan Note (Signed)
Immunizations UTD. Mammogram scheduled. Bone density scan due in 2023. Fecal card due, she has this today.  Discussed the importance of a healthy diet and regular exercise in order for weight loss, and to reduce the risk of any potential medical problems.  Exam today stable. Labs reviewed.

## 2020-02-04 ENCOUNTER — Other Ambulatory Visit (INDEPENDENT_AMBULATORY_CARE_PROVIDER_SITE_OTHER): Payer: Medicare Other

## 2020-02-04 DIAGNOSIS — Z1211 Encounter for screening for malignant neoplasm of colon: Secondary | ICD-10-CM

## 2020-02-04 LAB — FECAL OCCULT BLOOD, IMMUNOCHEMICAL: Fecal Occult Bld: NEGATIVE

## 2020-02-07 ENCOUNTER — Ambulatory Visit (INDEPENDENT_AMBULATORY_CARE_PROVIDER_SITE_OTHER): Payer: Medicare Other

## 2020-02-07 ENCOUNTER — Other Ambulatory Visit: Payer: Self-pay

## 2020-02-07 DIAGNOSIS — Z0000 Encounter for general adult medical examination without abnormal findings: Secondary | ICD-10-CM | POA: Diagnosis not present

## 2020-02-07 NOTE — Progress Notes (Signed)
Subjective:   Natalie Sosa is a 75 y.o. female who presents for Medicare Annual (Subsequent) preventive examination.  Review of Systems: N/A    I connected with the patient today by telephone and verified that I am speaking with the correct person using two identifiers. Location patient: home Location nurse: work Persons participating in the telephone visit: patient, nurse.   I discussed the limitations, risks, security and privacy concerns of performing an evaluation and management service by telephone and the availability of in person appointments. I also discussed with the patient that there may be a patient responsible charge related to this service. The patient expressed understanding and verbally consented to this telephonic visit.        Cardiac Risk Factors include: advanced age (>3men, >29 women);Other (see comment), Risk factor comments: hypercholesterolemia     Objective:    Today's Vitals   There is no height or weight on file to calculate BMI.  Advanced Directives 02/07/2020 01/28/2019 01/19/2018 01/13/2017 01/07/2016  Does Patient Have a Medical Advance Directive? Yes Yes Yes Yes Yes  Type of Paramedic of [place];Living will [place];Living will [place];Living will Living will;Healthcare Power of [place];Living will  Copy of [place] in Chart? Yes - validated most recent copy scanned in chart (See row information) Yes - validated most recent copy scanned in chart (See row information) No - copy requested No - copy requested No - copy requested    Current Medications (verified) Outpatient Encounter Medications as of 02/07/2020  Medication Sig  . CALCIUM-VITAMIN D PO Take 2 tablets by mouth daily.  Marland Kitchen ibuprofen (ADVIL,MOTRIN) 200 MG tablet Use as directed  . Multiple Vitamin (MULTIVITAMIN) tablet Take 1 tablet by mouth daily.  . simvastatin (ZOCOR) 20 MG  tablet TAKE 1 TABLET(20 MG) BY MOUTH AT BEDTIME for cholesterol.   No facility-administered encounter medications on file as of 02/07/2020.    Allergies (verified) Ezetimibe-simvastatin, Hydrocodone, and Prednisone   History: Past Medical History:  Diagnosis Date  . Cancer [facility])    skin   Past Surgical History:  Procedure Laterality Date  . ACHILLES TENDON REPAIR  06/14/2004  . CESAREAN SECTION     placenta previa  . CHOLECYSTECTOMY  06/13/2001   Family History  Problem Relation Age of Onset  . Heart disease Mother   . Dementia Mother   . Cancer Father        Pancreatic  . Stroke Father   . Breast cancer Paternal Grandmother 41  . Breast cancer Paternal Aunt    Social History   Socioeconomic History  . Marital status: Married    Spouse name: Not on file  . Number of children: 1  . Years of education: Not on file  . Highest education level: Not on file  Occupational History  . Not on file  Tobacco Use  . Smoking status: Never Smoker  . Smokeless tobacco: Never Used  Vaping Use  . Vaping Use: Never used  Substance and Sexual Activity  . Alcohol use: Yes    Comment: glass of wine occ  . Drug use: Never  . Sexual activity: Yes  Other Topics Concern  . Not on file  Social History Narrative   Husband Gershon Mussel, aware of her end of life wishes.  She would desire CPR, no feedings tubes.   Social Determinants of Health   Financial Resource Strain: Low Risk   . Difficulty of Paying  Living Expenses: Not hard at all  Food Insecurity: No Food Insecurity  . Worried About Charity fundraiser in the Last Year: Never true  . Ran Out of Food in the Last Year: Never true  Transportation Needs: No Transportation Needs  . Lack of Transportation (Medical): No  . Lack of Transportation (Non-Medical): No  Physical Activity: Sufficiently Active  . Days of Exercise per Week: 3 days  . Minutes of Exercise per Session: 120 min  Stress: No Stress Concern Present  . Feeling of Stress  : Not at all  Social Connections: Not on file    Tobacco Counseling Counseling given: Not Answered   Clinical Intake:  Pre-visit preparation completed: Yes  Pain : No/denies pain     Nutritional Risks: None Diabetes: No  How often do you need to have someone help you when you read instructions, pamphlets, or other written materials from your doctor or pharmacy?: 1 - Never What is the last grade level you completed in school?: bachelors  Diabetic: no Nutrition Risk Assessment:  Has the patient had any N/V/D within the last 2 months?  No  Does the patient have any non-healing wounds?  No  Has the patient had any unintentional weight loss or weight gain?  No   Diabetes:  Is the patient diabetic?  No  If diabetic, was a CBG obtained today?  N/A Did the patient bring in their glucometer from home?  N/A How often do you monitor your CBG's? N/A.   Financial Strains and Diabetes Management:  Are you having any financial strains with the device, your supplies or your medication? N/A.  Does the patient want to be seen by Chronic Care Management for management of their diabetes?  N/A Would the patient like to be referred to a Nutritionist or for Diabetic Management?  N/A  Interpreter Needed?: No  Information entered by :: CJohnson, LPN   Activities of Daily Living In your present state of health, do you have any difficulty performing the following activities: 02/07/2020 02/03/2020  Hearing? N N  Vision? N N  Difficulty concentrating or making decisions? N N  Walking or climbing stairs? N N  Dressing or bathing? N N  Doing errands, shopping? N N  Preparing Food and eating ? N -  Using the Toilet? N -  In the past six months, have you accidently leaked urine? N -  Do you have problems with loss of bowel control? N -  Managing your Medications? N -  Managing your Finances? N -  Housekeeping or managing your Housekeeping? N -  Some recent data might be hidden     Patient Care Team: Pleas Koch, NP as PCP - General (Internal Medicine) Regal, Tamala Fothergill, DPM as Consulting Physician (Podiatry) Leandrew Koyanagi, MD as Referring Physician (Ophthalmology) Oneta Rack, MD as Consulting Physician (Dermatology) Mardene Celeste., MD as Consulting Physician (Dentistry)  Indicate any recent Medical Services you may have received from other than [facility] providers in the past year (date may be approximate).     Assessment:   This is a routine wellness examination for Natalie Sosa.  Hearing/Vision screen  Hearing Screening   125Hz  250Hz  500Hz  1000Hz  2000Hz  3000Hz  4000Hz  6000Hz  8000Hz   Right ear:           Left ear:           Vision Screening Comments: Patient gets annual eye exams   Dietary issues and exercise activities discussed: Current Exercise Habits: Home exercise routine,  Type of exercise: Other - see comments (plays tennis), Time (Minutes): > 60, Frequency (Times/Week): 3, Weekly Exercise (Minutes/Week): 0, Intensity: Moderate, Exercise limited by: None identified  Goals    . Increase physical activity     Starting 01/19/2018, I will continue to play tennis for at least 90 minutes 3-4 days per week.     . Patient Stated     01/28/2019, I will maintain and continue medications as prescribed.     . Patient Stated     02/07/2020, I will continue to play tennis 3 days a week for 1 1/2 hours.      Depression Screen PHQ 2/9 Scores 02/07/2020 02/03/2020 01/28/2019 01/19/2018 01/13/2017 01/07/2016 01/08/2015  PHQ - 2 Score 0 2 0 0 0 0 0  PHQ- 9 Score 0 3 0 0 0 - -    Fall Risk Fall Risk  02/07/2020 02/03/2020 01/28/2019 01/19/2018 01/13/2017  Falls in the past year? 0 0 0 0 No  Number falls in past yr: 0 - 0 - -  Injury with Fall? 0 0 0 - -  Risk for fall due to : No Fall Risks - - - -  Follow up Falls evaluation completed;Falls prevention discussed - Falls evaluation completed;Falls prevention discussed - -    FALL RISK PREVENTION  PERTAINING TO THE HOME:  Any stairs in or around the home? Yes  If so, are there any without handrails? No  Home free of loose throw rugs in walkways, pet beds, electrical cords, etc? Yes  Adequate lighting in your home to reduce risk of falls? Yes   ASSISTIVE DEVICES UTILIZED TO PREVENT FALLS:  Life alert? No  Use of a cane, walker or w/c? No  Grab bars in the bathroom? No  Shower chair or bench in shower? No  Elevated toilet seat or a handicapped toilet? No   TIMED UP AND GO:  Was the test performed? N/A, telephone visit .   Cognitive Function: MMSE - Mini Mental State Exam 02/07/2020 01/28/2019 01/19/2018 01/13/2017 01/07/2016  Orientation to time 5 5 5 5 5   Orientation to Place 5 5 5 5 5   Registration 3 3 3 3 3   Attention/ Calculation 5 5 0 0 0  Recall 3 3 3 3 3   Language- name 2 objects - - 0 0 0  Language- repeat 1 1 1 1 1   Language- follow 3 step command - - 3 3 3   Language- read & follow direction - - 0 0 0  Write a sentence - - 0 0 0  Copy design - - 0 0 0  Total score - - 20 20 20   Mini Cog  Mini-Cog screen was completed. Maximum score is 22. A value of 0 denotes this part of the MMSE was not completed or the patient failed this part of the Mini-Cog screening.       Immunizations Immunization History  Administered Date(s) Administered  . Fluad Quad(high Dose 65+) 10/25/2018, 10/08/2019  . Influenza Split 12/29/2010  . Influenza Whole 11/05/2007, 11/09/2009  . Influenza, High Dose Seasonal PF 10/02/2016  . Influenza,inj,Quad PF,6+ Mos 10/04/2017  . Influenza-Unspecified 10/24/2012, 10/07/2013, 10/21/2015  . PFIZER SARS-COV-2 Vaccination 02/16/2019, 03/09/2019, 10/24/2019  . Pneumococcal Conjugate-13 01/01/2014  . Pneumococcal Polysaccharide-23 11/11/2010  . Td 10/13/2005  . Zoster 03/20/2007  . Zoster Recombinat (Shingrix) 11/29/2018, 02/04/2019    TDAP status: Due, Education has been provided regarding the importance of this vaccine. Advised may receive  this vaccine at local pharmacy or Health  Dept. Aware to provide a copy of the vaccination record if obtained from local pharmacy or Health Dept. Verbalized acceptance and understanding.  Flu Vaccine status: Up to date  Pneumococcal vaccine status: Up to date  Covid-19 vaccine status: Completed vaccines  Qualifies for Shingles Vaccine? Yes   Zostavax completed Yes   Shingrix Completed?: Yes  Screening Tests Health Maintenance  Topic Date Due  . TETANUS/TDAP  10/12/2025 (Originally 10/14/2015)  . COLONOSCOPY (Pts 45-58yrs Insurance coverage will need to be confirmed)  01/06/2026 (Originally 09/18/1990)  . MAMMOGRAM  02/28/2020  . COLON CANCER SCREENING ANNUAL FOBT  02/03/2021  . INFLUENZA VACCINE  Completed  . DEXA SCAN  Completed  . COVID-19 Vaccine  Completed  . Hepatitis C Screening  Completed  . PNA vac Low Risk Adult  Completed    Health Maintenance  There are no preventive care reminders to display for this patient.  Colorectal cancer screening: Type of screening: FOBT/FIT. Completed 02/04/2020. Repeat every 1 years  Mammogram status: Completed 02/28/2019. Repeat every year  Bone Density status: Completed 02/28/2019. Results reflect: Bone density results: OSTEOPENIA. Repeat every 2 years.  Lung Cancer Screening: (Low Dose CT Chest recommended if Age 31-80 years, 30 pack-year currently smoking OR have quit w/in 15years.) does not qualify.   Additional Screening:  Hepatitis C Screening: does qualify; Completed 01/07/2016  Vision Screening: Recommended annual ophthalmology exams for early detection of glaucoma and other disorders of the eye. Is the patient up to date with their annual eye exam?  Yes  Who is the provider or what is the name of the office in which the patient attends annual eye exams? Dr. Wallace Going, [facility] If pt is not established with a provider, would they like to be referred to a provider to establish care? No .   Dental Screening: Recommended  annual dental exams for proper oral hygiene  Community Resource Referral / Chronic Care Management: CRR required this visit?  No   CCM required this visit?  No      Plan:     I have personally reviewed and noted the following in the patient's chart:   . Medical and social history . Use of alcohol, tobacco or illicit drugs  . Current medications and supplements . Functional ability and status . Nutritional status . Physical activity . Advanced directives . List of other physicians . Hospitalizations, surgeries, and ER visits in previous 12 months . Vitals . Screenings to include cognitive, depression, and falls . Referrals and appointments  In addition, I have reviewed and discussed with patient certain preventive protocols, quality metrics, and best practice recommendations. A written personalized care plan for preventive services as well as general preventive health recommendations were provided to patient.   Due to this being a telephonic visit, the after visit summary with patients personalized plan was offered to patient via office or my-chart. Patient preferred to pick up at office at next visit or via mychart.   Andrez Grime, LPN   [id number]

## 2020-02-07 NOTE — Patient Instructions (Signed)
Natalie Sosa , Thank you for taking time to come for your Medicare Wellness Visit. I appreciate your ongoing commitment to your health goals. Please review the following plan we discussed and let me know if I can assist you in the future.   Screening recommendations/referrals: Colonoscopy: FOBT completed 02/04/2020 due 1 year  Mammogram: Up to date, completed 02/28/2019, scheduled 03/05/2020 Bone Density: Up to date, completed 02/28/2019, due 02/2021 Recommended yearly ophthalmology/optometry visit for glaucoma screening and checkup Recommended yearly dental visit for hygiene and checkup  Vaccinations: Influenza vaccine: Up to date, completed 10/08/2019 due 08/2020 Pneumococcal vaccine: Completed series Tdap vaccine: decline-insurance Shingles vaccine: Completed series   Covid-19:Completed series  Advanced directives: copy in chart  Conditions/risks identified: hypercholesterolemia  Next appointment: Follow up in one year for your annual wellness visit    Preventive Care 75 Years and Older, Female Preventive care refers to lifestyle choices and visits with your health care provider that can promote health and wellness. What does preventive care include?  A yearly physical exam. This is also called an annual well check.  Dental exams once or twice a year.  Routine eye exams. Ask your health care provider how often you should have your eyes checked.  Personal lifestyle choices, including:  Daily care of your teeth and gums.  Regular physical activity.  Eating a healthy diet.  Avoiding tobacco and drug use.  Limiting alcohol use.  Practicing safe sex.  Taking low-dose aspirin every day.  Taking vitamin and mineral supplements as recommended by your health care provider. What happens during an annual well check? The services and screenings done by your health care provider during your annual well check will depend on your age, overall health, lifestyle risk factors, and family  history of disease. Counseling  Your health care provider may ask you questions about your:  Alcohol use.  Tobacco use.  Drug use.  Emotional well-being.  Home and relationship well-being.  Sexual activity.  Eating habits.  History of falls.  Memory and ability to understand (cognition).  Work and work Statistician.  Reproductive health. Screening  You may have the following tests or measurements:  Height, weight, and BMI.  Blood pressure.  Lipid and cholesterol levels. These may be checked every 5 years, or more frequently if you are over 44 years old.  Skin check.  Lung cancer screening. You may have this screening every year starting at age 75 if you have a 30-pack-year history of smoking and currently smoke or have quit within the past 15 years.  Fecal occult blood test (FOBT) of the stool. You may have this test every year starting at age 75.  Flexible sigmoidoscopy or colonoscopy. You may have a sigmoidoscopy every 5 years or a colonoscopy every 10 years starting at age 75.  Hepatitis C blood test.  Hepatitis B blood test.  Sexually transmitted disease (STD) testing.  Diabetes screening. This is done by checking your blood sugar (glucose) after you have not eaten for a while (fasting). You may have this done every 1-3 years.  Bone density scan. This is done to screen for osteoporosis. You may have this done starting at age 75.  Mammogram. This may be done every 1-2 years. Talk to your health care provider about how often you should have regular mammograms. Talk with your health care provider about your test results, treatment options, and if necessary, the need for more tests. Vaccines  Your health care provider may recommend certain vaccines, such as:  Influenza vaccine.  This is recommended every year.  Tetanus, diphtheria, and acellular pertussis (Tdap, Td) vaccine. You may need a Td booster every 10 years.  Zoster vaccine. You may need this after  age 40.  Pneumococcal 13-valent conjugate (PCV13) vaccine. One dose is recommended after age 100.  Pneumococcal polysaccharide (PPSV23) vaccine. One dose is recommended after age 67. Talk to your health care provider about which screenings and vaccines you need and how often you need them. This information is not intended to replace advice given to you by your health care provider. Make sure you discuss any questions you have with your health care provider. Document Released: 02/06/2015 Document Revised: 09/30/2015 Document Reviewed: 11/11/2014 Elsevier Interactive Patient Education  2017 [employer] Prevention in the Home Falls can cause injuries. They can happen to people of all ages. There are many things you can do to make your home safe and to help prevent falls. What can I do on the outside of my home?  Regularly fix the edges of walkways and driveways and fix any cracks.  Remove anything that might make you trip as you walk through a door, such as a raised step or threshold.  Trim any bushes or trees on the path to your home.  Use bright outdoor lighting.  Clear any walking paths of anything that might make someone trip, such as rocks or tools.  Regularly check to see if handrails are loose or broken. Make sure that both sides of any steps have handrails.  Any raised decks and porches should have guardrails on the edges.  Have any leaves, snow, or ice cleared regularly.  Use sand or salt on walking paths during winter.  Clean up any spills in your garage right away. This includes oil or grease spills. What can I do in the bathroom?  Use night lights.  Install grab bars by the toilet and in the tub and shower. Do not use towel bars as grab bars.  Use non-skid mats or decals in the tub or shower.  If you need to sit down in the shower, use a plastic, non-slip stool.  Keep the floor dry. Clean up any water that spills on the floor as soon as it  happens.  Remove soap buildup in the tub or shower regularly.  Attach bath mats securely with double-sided non-slip rug tape.  Do not have throw rugs and other things on the floor that can make you trip. What can I do in the bedroom?  Use night lights.  Make sure that you have a light by your bed that is easy to reach.  Do not use any sheets or blankets that are too big for your bed. They should not hang down onto the floor.  Have a firm chair that has side arms. You can use this for support while you get dressed.  Do not have throw rugs and other things on the floor that can make you trip. What can I do in the kitchen?  Clean up any spills right away.  Avoid walking on wet floors.  Keep items that you use a lot in easy-to-reach places.  If you need to reach something above you, use a strong step stool that has a grab bar.  Keep electrical cords out of the way.  Do not use floor polish or wax that makes floors slippery. If you must use wax, use non-skid floor wax.  Do not have throw rugs and other things on the floor that can make  you trip. What can I do with my stairs?  Do not leave any items on the stairs.  Make sure that there are handrails on both sides of the stairs and use them. Fix handrails that are broken or loose. Make sure that handrails are as long as the stairways.  Check any carpeting to make sure that it is firmly attached to the stairs. Fix any carpet that is loose or worn.  Avoid having throw rugs at the top or bottom of the stairs. If you do have throw rugs, attach them to the floor with carpet tape.  Make sure that you have a light switch at the top of the stairs and the bottom of the stairs. If you do not have them, ask someone to add them for you. What else can I do to help prevent falls?  Wear shoes that:  Do not have high heels.  Have rubber bottoms.  Are comfortable and fit you well.  Are closed at the toe. Do not wear sandals.  If you  use a stepladder:  Make sure that it is fully opened. Do not climb a closed stepladder.  Make sure that both sides of the stepladder are locked into place.  Ask someone to hold it for you, if possible.  Clearly mark and make sure that you can see:  Any grab bars or handrails.  First and last steps.  Where the edge of each step is.  Use tools that help you move around (mobility aids) if they are needed. These include:  Canes.  Walkers.  Scooters.  Crutches.  Turn on the lights when you go into a dark area. Replace any light bulbs as soon as they burn out.  Set up your furniture so you have a clear path. Avoid moving your furniture around.  If any of your floors are uneven, fix them.  If there are any pets around you, be aware of where they are.  Review your medicines with your doctor. Some medicines can make you feel dizzy. This can increase your chance of falling. Ask your doctor what other things that you can do to help prevent falls. This information is not intended to replace advice given to you by your health care provider. Make sure you discuss any questions you have with your health care provider. Document Released: 11/06/2008 Document Revised: 06/18/2015 Document Reviewed: 02/14/2014 Elsevier Interactive Patient Education  2017 [employer].

## 2020-02-07 NOTE — Progress Notes (Signed)
PCP notes:  Health Maintenance: No gaps noted   Abnormal Screenings: none   Patient concerns: none   Nurse concerns: none   Next PCP appt.: 02/03/2021 @ 8 am

## 2020-02-17 DIAGNOSIS — R519 Headache, unspecified: Secondary | ICD-10-CM | POA: Diagnosis not present

## 2020-02-17 DIAGNOSIS — M9901 Segmental and somatic dysfunction of cervical region: Secondary | ICD-10-CM | POA: Diagnosis not present

## 2020-02-17 DIAGNOSIS — M9905 Segmental and somatic dysfunction of pelvic region: Secondary | ICD-10-CM | POA: Diagnosis not present

## 2020-02-17 DIAGNOSIS — M9903 Segmental and somatic dysfunction of lumbar region: Secondary | ICD-10-CM | POA: Diagnosis not present

## 2020-02-25 DIAGNOSIS — R519 Headache, unspecified: Secondary | ICD-10-CM | POA: Diagnosis not present

## 2020-02-25 DIAGNOSIS — M9905 Segmental and somatic dysfunction of pelvic region: Secondary | ICD-10-CM | POA: Diagnosis not present

## 2020-02-25 DIAGNOSIS — M9903 Segmental and somatic dysfunction of lumbar region: Secondary | ICD-10-CM | POA: Diagnosis not present

## 2020-02-25 DIAGNOSIS — M9901 Segmental and somatic dysfunction of cervical region: Secondary | ICD-10-CM | POA: Diagnosis not present

## 2020-03-05 ENCOUNTER — Ambulatory Visit
Admission: RE | Admit: 2020-03-05 | Discharge: 2020-03-05 | Disposition: A | Discharge status: Home / Self Care | Payer: Medicare Other | Source: Ambulatory Visit | Attending: Primary Care | Admitting: Primary Care

## 2020-03-05 ENCOUNTER — Other Ambulatory Visit: Payer: Self-pay

## 2020-03-05 DIAGNOSIS — Z1231 Encounter for screening mammogram for malignant neoplasm of breast: Secondary | ICD-10-CM | POA: Diagnosis not present

## 2020-03-16 DIAGNOSIS — M9903 Segmental and somatic dysfunction of lumbar region: Secondary | ICD-10-CM | POA: Diagnosis not present

## 2020-03-16 DIAGNOSIS — M9901 Segmental and somatic dysfunction of cervical region: Secondary | ICD-10-CM | POA: Diagnosis not present

## 2020-03-16 DIAGNOSIS — R519 Headache, unspecified: Secondary | ICD-10-CM | POA: Diagnosis not present

## 2020-03-16 DIAGNOSIS — M9905 Segmental and somatic dysfunction of pelvic region: Secondary | ICD-10-CM | POA: Diagnosis not present

## 2020-03-24 DIAGNOSIS — H40003 Preglaucoma, unspecified, bilateral: Secondary | ICD-10-CM | POA: Diagnosis not present

## 2020-04-13 DIAGNOSIS — M9903 Segmental and somatic dysfunction of lumbar region: Secondary | ICD-10-CM | POA: Diagnosis not present

## 2020-04-13 DIAGNOSIS — M9905 Segmental and somatic dysfunction of pelvic region: Secondary | ICD-10-CM | POA: Diagnosis not present

## 2020-04-13 DIAGNOSIS — M9901 Segmental and somatic dysfunction of cervical region: Secondary | ICD-10-CM | POA: Diagnosis not present

## 2020-04-13 DIAGNOSIS — R519 Headache, unspecified: Secondary | ICD-10-CM | POA: Diagnosis not present

## 2020-05-11 DIAGNOSIS — M9905 Segmental and somatic dysfunction of pelvic region: Secondary | ICD-10-CM | POA: Diagnosis not present

## 2020-05-11 DIAGNOSIS — R519 Headache, unspecified: Secondary | ICD-10-CM | POA: Diagnosis not present

## 2020-05-11 DIAGNOSIS — M9901 Segmental and somatic dysfunction of cervical region: Secondary | ICD-10-CM | POA: Diagnosis not present

## 2020-05-11 DIAGNOSIS — M9903 Segmental and somatic dysfunction of lumbar region: Secondary | ICD-10-CM | POA: Diagnosis not present

## 2020-06-02 DIAGNOSIS — H2513 Age-related nuclear cataract, bilateral: Secondary | ICD-10-CM | POA: Diagnosis not present

## 2020-06-03 DIAGNOSIS — H524 Presbyopia: Secondary | ICD-10-CM | POA: Diagnosis not present

## 2020-06-08 DIAGNOSIS — M9901 Segmental and somatic dysfunction of cervical region: Secondary | ICD-10-CM | POA: Diagnosis not present

## 2020-06-08 DIAGNOSIS — M9905 Segmental and somatic dysfunction of pelvic region: Secondary | ICD-10-CM | POA: Diagnosis not present

## 2020-06-08 DIAGNOSIS — M9903 Segmental and somatic dysfunction of lumbar region: Secondary | ICD-10-CM | POA: Diagnosis not present

## 2020-06-08 DIAGNOSIS — R519 Headache, unspecified: Secondary | ICD-10-CM | POA: Diagnosis not present

## 2020-07-06 DIAGNOSIS — M9901 Segmental and somatic dysfunction of cervical region: Secondary | ICD-10-CM | POA: Diagnosis not present

## 2020-07-06 DIAGNOSIS — R519 Headache, unspecified: Secondary | ICD-10-CM | POA: Diagnosis not present

## 2020-07-06 DIAGNOSIS — M9905 Segmental and somatic dysfunction of pelvic region: Secondary | ICD-10-CM | POA: Diagnosis not present

## 2020-07-06 DIAGNOSIS — M9903 Segmental and somatic dysfunction of lumbar region: Secondary | ICD-10-CM | POA: Diagnosis not present

## 2020-07-29 DIAGNOSIS — D225 Melanocytic nevi of trunk: Secondary | ICD-10-CM | POA: Diagnosis not present

## 2020-07-29 DIAGNOSIS — Z85828 Personal history of other malignant neoplasm of skin: Secondary | ICD-10-CM | POA: Diagnosis not present

## 2020-07-29 DIAGNOSIS — D2261 Melanocytic nevi of right upper limb, including shoulder: Secondary | ICD-10-CM | POA: Diagnosis not present

## 2020-07-29 DIAGNOSIS — D2262 Melanocytic nevi of left upper limb, including shoulder: Secondary | ICD-10-CM | POA: Diagnosis not present

## 2020-08-03 DIAGNOSIS — R519 Headache, unspecified: Secondary | ICD-10-CM | POA: Diagnosis not present

## 2020-08-03 DIAGNOSIS — M9901 Segmental and somatic dysfunction of cervical region: Secondary | ICD-10-CM | POA: Diagnosis not present

## 2020-08-03 DIAGNOSIS — M9905 Segmental and somatic dysfunction of pelvic region: Secondary | ICD-10-CM | POA: Diagnosis not present

## 2020-08-03 DIAGNOSIS — M9903 Segmental and somatic dysfunction of lumbar region: Secondary | ICD-10-CM | POA: Diagnosis not present

## 2020-08-31 DIAGNOSIS — M9901 Segmental and somatic dysfunction of cervical region: Secondary | ICD-10-CM | POA: Diagnosis not present

## 2020-08-31 DIAGNOSIS — R519 Headache, unspecified: Secondary | ICD-10-CM | POA: Diagnosis not present

## 2020-08-31 DIAGNOSIS — M9905 Segmental and somatic dysfunction of pelvic region: Secondary | ICD-10-CM | POA: Diagnosis not present

## 2020-08-31 DIAGNOSIS — M9903 Segmental and somatic dysfunction of lumbar region: Secondary | ICD-10-CM | POA: Diagnosis not present

## 2020-09-14 ENCOUNTER — Encounter: Payer: Self-pay | Admitting: Family Medicine

## 2020-09-14 ENCOUNTER — Other Ambulatory Visit: Payer: Self-pay

## 2020-09-14 ENCOUNTER — Ambulatory Visit (INDEPENDENT_AMBULATORY_CARE_PROVIDER_SITE_OTHER): Payer: Medicare Other | Admitting: Family Medicine

## 2020-09-14 VITALS — BP 110/60 | HR 70 | Temp 98.3°F | Ht 67.25 in | Wt 164.8 lb

## 2020-09-14 DIAGNOSIS — M7701 Medial epicondylitis, right elbow: Secondary | ICD-10-CM

## 2020-09-14 NOTE — Progress Notes (Signed)
Natalie Biegler T. Londin Antone, MD, [place] at [facility] [place], [address]  Phone: [phone]  FAX: [phone]  Natalie Sosa - 75 y.o. female  MRN [id number]  Date of Birth: 09-30-1945  Date: 09/14/2020  PCP: Pleas Koch, NP  Referral: Pleas Koch, NP  Chief Complaint  Patient presents with   Elbow Pain    Right Inner Elbow    This visit occurred during the SARS-CoV-2 public health emergency.  Safety protocols were in place, including screening questions prior to the visit, additional usage of staff PPE, and extensive cleaning of exam room while observing appropriate contact time as indicated for disinfecting solutions.   Subjective:   Natalie Sosa presents with lateral elbow pain.  Length of symptoms: 2 weeks Hand effected: R  Patient describes a dull ache on the medial elbow. There is some translation in the proximal forearm and in the distal upper arm. Pronation is painful.  Patient points to the medial epicondyle as the point of maximal tenderness.  Tennis Friday.  This hurt quite a bit, particular with her one-handed backhand.  ? Trimming shrubbery.  She thinks that she could have aggravated more doing this quite a bit recently.  She was also having some pain with certain.  Tennis will play three times a week.  Cross friction massage.  Was doing regularly  Body helix did help.  Traditional tennis elbow strap did not help.  No trauma.   No prior fractures or operative interventions in the effective hand.  Prior PT or HEP: none  Denies numbness or tingling. No significant neck or shoulder pain.   Review of Systems is noted in the HPI, as appropriate  Objective:   Blood pressure 110/60, pulse 70, temperature 98.3 F (36.8 C), temperature source Temporal, height 5' 7.25" (1.708 m), weight 164 lb 12 oz (74.7 kg), SpO2 97 %.  GEN: No acute distress; alert,appropriate. PULM: Breathing  comfortably in no respiratory distress PSYCH: Normally interactive.   Elbow: R Ecchymosis or edema: neg ROM: full flexion, extension, pronation, supination Shoulder ROM: Full Flexion: 5/5 Extension: 5/5 Supination: 5/5 Pronation: 5/5, painful Wrist ext: 5/5 Wrist flexion: 5/5, mildly painful No gross bony abnormality Varus and Valgus stress: stable Medial epicondyle: TTP grip: 5/5  sensation intact  Subjective:     ICD-10-CM   1. Golfer's elbow, right  M77.01       Elbow anatomy was reviewed, and tendinopathy was explained.  A rehabilitation program from the [employer] was reviewed with the patient face to face for their condition.  Start off with isometrics and gentle stretching and ROM progressing to a series of concentric and eccentric exercises should be done starting with no weight, work up to 1 lb, hammer, etc.  When more active, do think that a body helix compression sleeve for elbow will help, and she has used this while playing tennis.  Emphasized stretching an cross-friction massage Patient Instructions  Voltaren 1% gel, over the counter You can apply up to 4 times a day  This can be applied to any joint: knee, wrist, fingers, elbows, shoulders, feet and ankles. Can apply to any tendon: tennis elbow, achilles, tendon, rotator cuff or any other tendon.  Minimal is absorbed in the bloodstream: ok with oral anti-inflammatory or a blood thinner.  Cost is about 9 dollars    Follow-up: As needed only  Signed,  Evony Rezek T. Leonard Feigel, MD   Patient's  Medications  New Prescriptions   No medications on file  Previous Medications   CALCIUM-VITAMIN D PO    Take 2 tablets by mouth daily.   IBUPROFEN (ADVIL,MOTRIN) 200 MG TABLET    Use as directed   MULTIPLE VITAMIN (MULTIVITAMIN) TABLET    Take 1 tablet by mouth daily.   SIMVASTATIN (ZOCOR) 20 MG TABLET    TAKE 1 TABLET(20 MG) BY MOUTH AT BEDTIME for cholesterol.  Modified Medications    No medications on file  Discontinued Medications   No medications on file

## 2020-09-14 NOTE — Patient Instructions (Signed)
Voltaren 1% gel, over the counter ?You can apply up to 4 times a day ? ?This can be applied to any joint: knee, wrist, fingers, elbows, shoulders, feet and ankles. ?Can apply to any tendon: tennis elbow, achilles, tendon, rotator cuff or any other tendon. ? ?Minimal is absorbed in the bloodstream: ok with oral anti-inflammatory or a blood thinner. ? ?Cost is about 9 dollars  ?

## 2020-09-25 DIAGNOSIS — H40003 Preglaucoma, unspecified, bilateral: Secondary | ICD-10-CM | POA: Diagnosis not present

## 2020-09-29 DIAGNOSIS — M9903 Segmental and somatic dysfunction of lumbar region: Secondary | ICD-10-CM | POA: Diagnosis not present

## 2020-09-29 DIAGNOSIS — R519 Headache, unspecified: Secondary | ICD-10-CM | POA: Diagnosis not present

## 2020-09-29 DIAGNOSIS — M9901 Segmental and somatic dysfunction of cervical region: Secondary | ICD-10-CM | POA: Diagnosis not present

## 2020-09-29 DIAGNOSIS — M9905 Segmental and somatic dysfunction of pelvic region: Secondary | ICD-10-CM | POA: Diagnosis not present

## 2020-10-28 DIAGNOSIS — M9903 Segmental and somatic dysfunction of lumbar region: Secondary | ICD-10-CM | POA: Diagnosis not present

## 2020-10-28 DIAGNOSIS — M9905 Segmental and somatic dysfunction of pelvic region: Secondary | ICD-10-CM | POA: Diagnosis not present

## 2020-10-28 DIAGNOSIS — M9901 Segmental and somatic dysfunction of cervical region: Secondary | ICD-10-CM | POA: Diagnosis not present

## 2020-10-28 DIAGNOSIS — R519 Headache, unspecified: Secondary | ICD-10-CM | POA: Diagnosis not present

## 2020-11-25 DIAGNOSIS — M9905 Segmental and somatic dysfunction of pelvic region: Secondary | ICD-10-CM | POA: Diagnosis not present

## 2020-11-25 DIAGNOSIS — M9901 Segmental and somatic dysfunction of cervical region: Secondary | ICD-10-CM | POA: Diagnosis not present

## 2020-11-25 DIAGNOSIS — M9903 Segmental and somatic dysfunction of lumbar region: Secondary | ICD-10-CM | POA: Diagnosis not present

## 2020-11-25 DIAGNOSIS — M5136 Other intervertebral disc degeneration, lumbar region: Secondary | ICD-10-CM | POA: Diagnosis not present

## 2020-12-03 ENCOUNTER — Other Ambulatory Visit: Payer: Self-pay | Admitting: Physician Assistant

## 2020-12-03 ENCOUNTER — Ambulatory Visit: Payer: Medicare Other | Admitting: Physician Assistant

## 2020-12-03 ENCOUNTER — Ambulatory Visit: Payer: Self-pay

## 2020-12-03 DIAGNOSIS — G8929 Other chronic pain: Secondary | ICD-10-CM

## 2020-12-03 DIAGNOSIS — M25561 Pain in right knee: Secondary | ICD-10-CM

## 2020-12-03 NOTE — Progress Notes (Signed)
Office Visit Note   Patient: Natalie Sosa           Date of Birth: 1945/06/22           MRN: [id number] Visit Date: 12/03/2020              Requested by: Pleas Koch, NP [place] [place],  [place] [address] PCP: Pleas Koch, NP  Chief Complaint  Patient presents with   Right Knee - Pain      HPI: Patient is a pleasant active 75 year old woman engages in tennis 3 times a week.  She presents today with pain in her right lateral leg at the knee joint and above.  She says that this started almost a year ago and does not seem to improve it has gotten somewhat worse the last few weeks.  She points to the lateral posterior aspect of her leg along the hamstring.  She denies any knee pain.  She has tried some topical anti-inflammatories for this.  She has not tried any specific stretching as she does not want to make things worse.  She denies any popping or catching in her knee.  She denies any night pain.  Most often she notices this when she does side to side activities when playing tennis  Assessment & Plan: Visit Diagnoses:  1. Chronic pain of right knee     Plan: Findings consistent with a distal hamstring strain.  Now been going on almost a year.  I think it would be reasonable for her to do a short amount of physical therapy to learn a home exercise program and perhaps some iontophoresis.  She will follow-up in 1 month certainly as this is been going on almost a year if she is no better with examination would recommend an MRI.  I also questioned her in regards to the calcification in the muscle on her left calf.  She does tell me she had a significant injury when a table fell many many years ago and in the area and had "significant scar tissue.  This would of course explain radiographic findings  Follow-Up Instructions: No follow-ups on file.   Ortho Exam  Patient is alert, oriented, no adenopathy, well-dressed, normal affect, normal respiratory effort. Examination  no antalgic gait.  She has no effusion no swelling in the knee.  No cellulitis.  She is nontender over the medial lateral joint lines or the patellofemoral joint.  She has good varus and valgus stability without pain.  She is tender along the course of the semitendinosus.  This is increased with flexion.  Left lower extremity she does have some palpable scarring.  From previous injury.  This is not changed or painful  Imaging: XR Knee 1-2 Views Right  Result Date: 12/03/2020 2 view radiographs of her right knee were obtained today.  She has overall ever so slight valgus alignment.  She does have advanced degenerative changes of the patellofemoral joint with bone-on-bone loss of joint space and osteophyte at the superior pole of the patella.  She has degenerative changes of the lateral compartment and medial compartment the lateral compartment had as osteophyte formation and lateral compartment sclerotic changes as well as some subchondral cyst formation  No images are attached to the encounter.  Labs: No results found for: HGBA1C, ESRSEDRATE, CRP, LABURIC, REPTSTATUS, GRAMSTAIN, CULT, LABORGA   Lab Results  Component Value Date   ALBUMIN 4.7 01/31/2020   ALBUMIN 4.5 01/28/2019   ALBUMIN 4.3 01/19/2018  No results found for: MG Lab Results  Component Value Date   VD25OH 45 11/03/2008    No results found for: PREALBUMIN CBC EXTENDED Latest Ref Rng & Units 01/28/2019 01/13/2017 01/07/2016  WBC 4.0 - 10.5 K/uL 5.6 5.0 5.0  RBC 3.87 - 5.11 Mil/uL 4.37 4.37 4.58  HGB 12.0 - 15.0 g/dL 13.7 13.6 14.1  HCT 36.0 - 46.0 % 39.8 40.8 41.5  PLT 150.0 - 400.0 K/uL 228.0 246.0 243.0  NEUTROABS 1.4 - 7.7 K/uL - 2.7 2.9  LYMPHSABS 0.7 - 4.0 K/uL - 1.6 1.5     There is no height or weight on file to calculate BMI.  Orders:  Orders Placed This Encounter  Procedures   XR Knee 1-2 Views Right   Ambulatory referral to Physical Therapy   No orders of the defined types were placed in this  encounter.    Procedures: No procedures performed  Clinical Data: No additional findings.  ROS:  All other systems negative, except as noted in the HPI. Review of Systems  Objective: Vital Signs: There were no vitals taken for this visit.  Specialty Comments:  No specialty comments available.  PMFS History: Patient Active Problem List   Diagnosis Date Noted   Preventative health care 01/11/2016   Insomnia 01/01/2014   HYPERCHOLESTEROLEMIA 10/04/2006   ENDOMETRIOSIS 10/04/2006   Past Medical History:  Diagnosis Date   Cancer ([place])    skin    Family History  Problem Relation Age of Onset   Heart disease Mother    Dementia Mother    Cancer Father        Pancreatic   Stroke Father    Breast cancer Paternal Grandmother 42   Breast cancer Paternal Aunt     Past Surgical History:  Procedure Laterality Date   ACHILLES TENDON REPAIR  06/14/2004   CESAREAN SECTION     placenta previa   CHOLECYSTECTOMY  06/13/2001   Social History   Occupational History   Not on file  Tobacco Use   Smoking status: Never   Smokeless tobacco: Never  Vaping Use   Vaping Use: Never used  Substance and Sexual Activity   Alcohol use: Yes    Comment: glass of wine occ   Drug use: Never   Sexual activity: Yes

## 2020-12-09 ENCOUNTER — Encounter: Payer: Self-pay | Admitting: Physical Therapy

## 2020-12-09 ENCOUNTER — Ambulatory Visit: Payer: Medicare Other | Attending: Physician Assistant | Admitting: Physical Therapy

## 2020-12-09 DIAGNOSIS — M25561 Pain in right knee: Secondary | ICD-10-CM | POA: Insufficient documentation

## 2020-12-09 DIAGNOSIS — M6281 Muscle weakness (generalized): Secondary | ICD-10-CM | POA: Diagnosis not present

## 2020-12-09 DIAGNOSIS — G8929 Other chronic pain: Secondary | ICD-10-CM

## 2020-12-09 DIAGNOSIS — M25661 Stiffness of right knee, not elsewhere classified: Secondary | ICD-10-CM | POA: Diagnosis not present

## 2020-12-09 DIAGNOSIS — R262 Difficulty in walking, not elsewhere classified: Secondary | ICD-10-CM | POA: Diagnosis not present

## 2020-12-09 NOTE — Therapy (Signed)
[facility] [address], [place], [address] Phone: [phone]   Fax:  [phone]  Physical Therapy Evaluation  Patient Details  Name: Natalie Sosa MRN: [id number] Date of Birth: [birth year] Referring Provider (PT): Persons, Bevely Palmer, [place]  Encounter Date: 12/09/2020   PT End of Session - 12/10/20 1107     Visit Number 1    Number of Visits 24    Date for PT Re-Evaluation 03/03/21    Authorization Type [employer] MEDICARE reporting period from 12/09/2020    Progress Note Due on Visit 10    PT Start Time 1520    PT Stop Time 1600    PT Time Calculation (min) 40 min    Activity Tolerance Patient tolerated treatment well    Behavior During Therapy [facility] for tasks assessed/performed             Past Medical History:  Diagnosis Date   Cancer [facility])    skin    Past Surgical History:  Procedure Laterality Date   ACHILLES TENDON REPAIR  06/14/2004   CESAREAN SECTION     placenta previa   CHOLECYSTECTOMY  06/13/2001    There were no vitals filed for this visit.    Subjective Assessment - 12/09/20 1512     Subjective Patient states condition started playing tennis about a year ago. It gets better and worse. She wears a body helix brace/sleeve thing that helps. She state it happened when she was playing tennis and it almost felt like she hyperextended the knee. States it didn't swell or bruise. She stopped playing the day she did it. She went back in a couple of days after the injury. Since then it has come and gone. She has tried going for a couple of weeks without playing tennis at all and it still hurts. Resting has not seemed to help. She played today and could feel it a little bit but it is better than it was 2 days ago. She thinks it was at it's worse about 2 weeks ago. Now it is not bothering her that much but it is enough that she feels she needs to do something. She drives home from [place] where she  plays tennis and 2 weeks ago it felt like she was not going to make it home but it was better after she got home and walked around on it some. She does have a history of back pain. She states that was about 3-4 years ago when she was arching her back too much trying to do a different serve and it pinched her nerve. It was on her left side and down into her posterior hip but not the leg. She saw a chiropractor and rested and got much better. It bothers her when she does yard work but has been pretty good lately. It usually gets better after a day when it hurts from yardwork. She has never had pain from her back that went down the right side. Denies numbness tingling or weakness except maybe some weakness when it first happened. Denies catching, popping, giving way. Plays tennis 2-3 times a week for 1.5 hours. She does avoid long walks.    Pertinent History Patient is a 75 y.o. female who presents to outpatient physical therapy with a referral for medical diagnosis chronic pain of right knee, hamstring strain. This patient's chief complaints consist of R knee pain especially at the posterolateral portion, leading to the following functional deficits: difficulty  with prolonged walking, playing tennis, yardwork, gardening, walking on uneven ground.   Relevant past medical history and comorbidities include skin cancer (2 years ago, still monitored, it was removed), insomnia, hypercholesterolemia, hx of right achilles tendon repair 06/14/2004, cholecystectomy, hx back pain, some bone loss when scanned for osteoporosis. Patient denies hx of stroke, seizures, lung problem, major cardiac events, diabetes, unexplained weight loss, changes in bowel or bladder problems, new onset stumbling or dropping things, spinal surgery.    Limitations House hold activities;Walking   decreased walking, playing tennis, yardwork, gardening, walking on uneven ground.   Diagnostic tests Imaging 12/03/2020:  "XR Knee 1-2 Views Right     Result  Date: 12/03/2020  2 view radiographs of her right knee were obtained today.  She has overall ever so slight valgus alignment.  She does have advanced degenerative changes of the patellofemoral joint with bone-on-bone loss of joint space and osteophyte at the superior pole of the patella.  She has degenerative changes of the lateral compartment and medial compartment the lateral compartment had as osteophyte formation and lateral compartment sclerotic changes as well as some subchondral cyst formation."    Patient Stated Goals "to not let it get any worse"    Currently in Pain? Yes    Pain Score 3    W: 8/10; B 0/10   Pain Location Knee    Pain Orientation Right   currently at back of knee from distal thigh to proximal calf more lateral than medial. States it has been in medial proximal tibial region or lateral distal quad, occasionally comes up to right hip at lateral thigh almost like hip bursitis she had before   Pain Descriptors / Indicators Aching;Sharp    Pain Radiating Towards denies paresthesia but does describe the pain moveing around and along her lateral right thigh.    Pain Onset More than a month ago    Pain Frequency Intermittent    Aggravating Factors  after tennis, side to side motions in tennis, after walking on uneven ground at home, usually feels pain after the activity. flexing the right knee to get out of the car    Pain Relieving Factors raising right knee and keeping it bent while sitting, sleeve brace,    Effect of Pain on Daily Activities Functional Limitations: decreased walking, playing tennis, yardwork, gardening, walking on uneven ground.                [facility] PT Assessment - 12/09/20 1531       Assessment   Medical Diagnosis Chronic pain of right knee, hamstring strain    Referring Provider (PT) Persons, Bevely Palmer, [place]    Onset Date/Surgical Date --   maybe a year ago   Next MD Visit 2-3 weeks from now    Prior Therapy none for this condition prior to current  episode of care      Precautions   Precautions None      Balance Screen   Has the patient fallen in the past 6 months No    Has the patient had a decrease in activity level because of a fear of falling?  No    Is the patient reluctant to leave their home because of a fear of falling?  No      Home Environment   Living Environment --   no concerns about getting around home safely     Prior Function   Level of Independence Independent    Vocation Retired  Vocation Requirements part time bookstore, housewife    Leisure Firefighter, reading, tennis, walking      Cognition   Overall Cognitive Status Within Functional Limits for tasks assessed             OBJECTIVE  SELF- REPORTED FUNCTION FOTO score: 57/100 (knee questionnaire)  OBSERVATION/INSPECTION Posture Posture (standing): bilateral mild/moderate genuvalgus Anthropometrics Tremor: none Body composition: BMI 25.4 Muscle bulk: low in B thighs and calves. Edema: none noted Functional Mobility Bed mobility: supine <> sit and rolling I  Transfers: sit <> stand I  Gait: grossly WFL for household and short community ambulation. More detailed gait analysis deferred to later date as needed. Noted for genuvalgum bilaterally.   SPINE MOTION  Lumbar Spine AROM (with OP) *Indicates pain Flexion: fingers to shoe laces (normal range), no pain with OP Extension: WFL, mild pain in knee with OP Side Flexion with OP:   R WFL no concordant pain  L WFL no concordant pain Rotation with OP:  R WLF no concordant pain L WFL no concordant pain  NEUROLOGICAL L2-S2 appears equal and intact to light touch.  PERIPHERAL JOINT MOTION (in degrees)  Passive Range of Motion (PROM) *Indicates pain 12/09/20 Date Date  Joint/Motion R/L R/L R/L  Knee     Extension -10*/0 / /  Flexion 118*/125 / /  Comments:  12/09/2020: B LE WFL except as noted. R knee firm end feel  MUSCLE PERFORMANCE (MMT):  *Indicates pain 12/09/20 Date Date   Joint/Motion R/L R/L R/L  Shoulder     Hip     Flexion (L1, L2) 4/4 / /  Extension (knee ext) 4+/4+ / /  Abduction 4+/4 / /  Knee     Extension (L3) 5/5 / /  Flexion (S2) 4+/5 / /  Ankle/Foot     Dorsiflexion (L4) 5/5 / /  Great toe extension (L5) 4/4 / /  Eversion (S1) 5/4+ / /  Plantarflexion (S1) 5/5 / /  Comments:  12/09/2020: R resisted eccentric hamstring testing negative for pain in neutral or biased towards lateral or medial hamstrings.   SPECIAL TESTS: LOWER LIMB NEURODYNAMIC Straight Leg Raise (Sciatic nerve)  R  = positive for concordant symptoms posterior knee, worse with DF, hip adduction, hip IR, and hip ER, better with hip abduction, no effect with neck flexion.   L  = negative  ACCESSORY MOTION:  Tibiofemoral joint mildly hypomobile but not painful R > L.  proximal tibfib joint WFL bilaterally  PALPATION: Slight bulkiness at right posterolateral knee joint line (consider possible baker's cyst). Not TTP at R anterior, lateral, or medial joint lines; around or on patella, distal anterior or lateral quad.  TTP grade I/II at regions of distal lateral hamstring and proximal lateral head of gastroc.   FUNCTIONAL/BALANCE TESTS: Squat (self selected): knees collapse inward touching, knees well over toes, spine rounded, appears crouch-like.   Objective measurements completed on examination: See above findings.      PT Education - 12/10/20 1107     Education Details Education on diagnosis, prognosis, POC, anatomy and physiology of current condition    Person(s) Educated Patient    Methods Explanation    Comprehension Verbalized understanding;Need further instruction              PT Short Term Goals - 12/10/20 1108       PT SHORT TERM GOAL #1   Title Be independent with initial home exercise program for self-management of symptoms.    Baseline Initial HEP to  be provided at visit 2 as appropriate (12/09/2020);    Time 2    Period Weeks    Status New     Target Date 12/24/20               PT Long Term Goals - 12/10/20 1109       PT LONG TERM GOAL #1   Title Be independent with a long-term home exercise program for self-management of symptoms.    Baseline Initial HEP to be provided at visit 2 as appropriate (12/09/2020);    Time 12    Period Weeks    Status New   TARGET DATE FOR ALL LONG TERM GOALS: 03/03/2021     PT LONG TERM GOAL #2   Title Demonstrate improved FOTO to equal or greater than 67 by visit #11 to demonstrate improvement in overall condition and self-reported functional ability.    Baseline 57 (12/09/2020);    Time 12    Period Weeks    Status New      PT LONG TERM GOAL #3   Title Reduce pain with functional activities to equal or less than 1/10 to allow patient to complete usual activities including housework, walking, playing tennis with less difficulty    Baseline up to 8/10 (12/09/2020);    Time 12    Period Weeks    Status New      PT LONG TERM GOAL #4   Title Be able to squat to chair height 1x10 reps with proper form without limitation due to current condition in order to improve ability to lift and complete transfers during usual activities such as tennis.    Baseline Squat (self selected): knees collapse inward touching, knees well over toes, spine rounded, appears crouch-like (12/09/2020);    Time 12    Period Weeks    Status New      PT LONG TERM GOAL #5   Title Improve B hip strength to equal or greater than 4+/5 with no increase in pain to improve patient's ability to complete functional tasks such as  walking and tennis with less difficulty.    Baseline as low as 4/5 (12/09/2020);    Time 12    Period Weeks    Status New      Additional Long Term Goals   Additional Long Term Goals Yes      PT LONG TERM GOAL #6   Title Complete community, work and/or recreational activities without limitation due to current condition.    Baseline Functional Limitations: decreased walking, playing tennis,  yardwork, gardening, walking on uneven ground (12/09/2020);    Time 12    Period Weeks    Status New                    Plan - 12/10/20 1342     Clinical Impression Statement Patient is a 75 y.o. female referred to outpatient physical therapy with a medical diagnosis of chronic pain of right knee, hamstring strain who presents with signs and symptoms consistent with chronic right knee pain. Exam reveals likely localized contribution to pain with low suspicion or referral from low back. Radiographs reports significant degenerative changes in right knee, patient has reduced range of motion with firm/painful end feel and palpated pain crosses over joint line from just proximal to just distal of posterior aspect of knee (more lateral than medial) with mild bulge in soft tissue that increases suspicion for Baker's cyst. Patient has low muscle bulk in quads and hamstrings  and demonstrates suboptimal strength in hips and knees. She demonstrates poor mechanics with squat. She would benefit from interventions such as those that improve LE strength, motor control.  Patient presents with significant pain, ROM, balance, joint stiffness, motor control, muscle performance (strength/power/endurance), and activity tolerance impairments that are limiting ability to complete her usual activities such as walking, playing tennis, yardwork, gardening, and walking on uneven ground without difficulty and decreases her quality of life. Patient will benefit from skilled physical therapy intervention to address current body structure impairments and activity limitations to improve function and work towards goals set in current POC in order to return to prior level of function or maximal functional improvement.    Personal Factors and Comorbidities Past/Current Experience;Time since onset of injury/illness/exacerbation    Examination-Activity Limitations Squat;Stairs;Locomotion Level    Examination-Participation  Restrictions Community Activity;Interpersonal Relationship;Yard Work   decreased walking, playing tennis, yardwork, gardening, walking on uneven ground.   Stability/Clinical Decision Making Stable/Uncomplicated    Clinical Decision Making Low    Rehab Potential Good    PT Frequency 2x / week    PT Duration 12 weeks    PT Treatment/Interventions ADLs/Self Care Home Management;Aquatic Therapy;Cryotherapy;Moist Heat;Electrical Stimulation;Gait training;Stair training;Functional mobility training;Therapeutic activities;Therapeutic exercise;Balance training;Neuromuscular re-education;Manual techniques;Dry needling;Passive range of motion    PT Next Visit Plan establish HEP, LE strength, balance, motor control training, manual as need    PT Home Exercise Plan TBD    Consulted and Agree with Plan of Care Patient             Patient will benefit from skilled therapeutic intervention in order to improve the following deficits and impairments:  Improper body mechanics, Pain, Decreased mobility, Decreased activity tolerance, Decreased endurance, Decreased range of motion, Decreased strength, Impaired perceived functional ability, Hypomobility, Decreased balance, Difficulty walking  Visit Diagnosis: Chronic pain of right knee  Stiffness of right knee, not elsewhere classified  Difficulty in walking, not elsewhere classified  Muscle weakness (generalized)     Problem List Patient Active Problem List   Diagnosis Date Noted   Preventative health care 01/11/2016   Insomnia 01/01/2014   HYPERCHOLESTEROLEMIA 10/04/2006   ENDOMETRIOSIS 10/04/2006    Clarise Cruz R. Graylon Good, PT, DPT 12/10/20, 1:45 PM   [facility] [address] [place], [address] Phone: [phone]   Fax:  [phone]  Name: Natalie Sosa MRN: [id number] Date of Birth: Jun 06, 1945

## 2020-12-16 ENCOUNTER — Encounter: Payer: Self-pay | Admitting: Physical Therapy

## 2020-12-16 ENCOUNTER — Ambulatory Visit: Payer: Medicare Other | Admitting: Physical Therapy

## 2020-12-16 DIAGNOSIS — G8929 Other chronic pain: Secondary | ICD-10-CM | POA: Diagnosis not present

## 2020-12-16 DIAGNOSIS — M6281 Muscle weakness (generalized): Secondary | ICD-10-CM | POA: Diagnosis not present

## 2020-12-16 DIAGNOSIS — R262 Difficulty in walking, not elsewhere classified: Secondary | ICD-10-CM | POA: Diagnosis not present

## 2020-12-16 DIAGNOSIS — M25661 Stiffness of right knee, not elsewhere classified: Secondary | ICD-10-CM | POA: Diagnosis not present

## 2020-12-16 DIAGNOSIS — M25561 Pain in right knee: Secondary | ICD-10-CM | POA: Diagnosis not present

## 2020-12-16 NOTE — Therapy (Signed)
[facility] [address], [place], [address] Phone: [phone]   Fax:  [phone]  Physical Therapy Treatment  Patient Details  Name: Natalie Sosa MRN: [id number] Date of Birth: 1945-05-21 Referring Provider (PT): Persons, Bevely Palmer, [place]   Encounter Date: 12/16/2020   PT End of Session - 12/16/20 1711     Visit Number 2    Number of Visits 24    Date for PT Re-Evaluation 03/03/21    Authorization Type [employer] MEDICARE reporting period from 12/09/2020    Progress Note Due on Visit 10    PT Start Time 1600    PT Stop Time 1640    PT Time Calculation (min) 40 min    Activity Tolerance Patient tolerated treatment well    Behavior During Therapy [facility] for tasks assessed/performed             Past Medical History:  Diagnosis Date   Cancer [facility])    skin    Past Surgical History:  Procedure Laterality Date   ACHILLES TENDON REPAIR  06/14/2004   CESAREAN SECTION     placenta previa   CHOLECYSTECTOMY  06/13/2001    There were no vitals filed for this visit.   Subjective Assessment - 12/16/20 1601     Subjective Patient reports she is feeling well today. Rates her pain is very low, 1/10 upon arrival in the right posteriolateral region.    Pertinent History Patient is a 75 y.o. female who presents to outpatient physical therapy with a referral for medical diagnosis chronic pain of right knee, hamstring strain. This patient's chief complaints consist of R knee pain especially at the posterolateral portion, leading to the following functional deficits: difficulty with prolonged walking, playing tennis, yardwork, gardening, walking on uneven ground.   Relevant past medical history and comorbidities include skin cancer (2 years ago, still monitored, it was removed), insomnia, hypercholesterolemia, hx of right achilles tendon repair 06/14/2004, cholecystectomy, hx back pain, some bone loss when scanned for  osteoporosis. Patient denies hx of stroke, seizures, lung problem, major cardiac events, diabetes, unexplained weight loss, changes in bowel or bladder problems, new onset stumbling or dropping things, spinal surgery.    Limitations House hold activities;Walking   decreased walking, playing tennis, yardwork, gardening, walking on uneven ground.   Diagnostic tests Imaging 12/03/2020:  "XR Knee 1-2 Views Right     Result Date: 12/03/2020  2 view radiographs of her right knee were obtained today.  She has overall ever so slight valgus alignment.  She does have advanced degenerative changes of the patellofemoral joint with bone-on-bone loss of joint space and osteophyte at the superior pole of the patella.  She has degenerative changes of the lateral compartment and medial compartment the lateral compartment had as osteophyte formation and lateral compartment sclerotic changes as well as some subchondral cyst formation."    Patient Stated Goals "to not let it get any worse"    Currently in Pain? Yes    Pain Score 1     Pain Onset More than a month ago                 TREATMENT:  Denies sensitivity to latex  Therapeutic exercise: to centralize symptoms and improve ROM, strength, muscular endurance, and activity tolerance required for successful completion of functional activities.  - NuStep level 2 using bilateral lower extremities. Seat setting 11. For improved extremity mobility, muscular endurance, and activity tolerance; and to induce  the analgesic effect of aerobic exercise, stimulate improved joint nutrition, and prepare body structures and systems for following interventions. X 5  minutes. Average SPM = 65 - Squats with BUE support focusing on glute activation and proper form with hip hinging, stabilized back, and tibial perpendicular to floor with knees behind toes. 3x10.  - SLS 3x30 seconds each side with touchdown support as needed.  - supine hamstring stretch with strap, both knees  extended, 3x30 seconds each side.  - hooklying bridge, 5 second hold, 1x10 - hooklying clam shell with RTB, 1x10 - hooklying bridge with hip abduction against RTB around distal thighs, 5 second holds, 1x20. (Rated medium-hard). - lateral stepping with GTB around distal thighs, 1x30 feet each direction - forwards and backwards monster walks with GTB around distal thighs, 1x30 feet each direction.  - Education on HEP including handout   Pt required multimodal cuing for proper technique and to facilitate improved neuromuscular control, strength, range of motion, and functional ability resulting in improved performance and form.  HOME EXERCISE PROGRAM Access Code: [email or web address] URL: [email or web address] Date: 12/16/2020 Prepared by: Rosita Kea  Exercises Standing Single Leg Stance with Counter Support - 1 x daily - 3 sets - 30 seconds hold Bridge with Hip Abduction and Resistance - Ground Touches - 1 x daily - 1 sets - 20 reps - 5 seconds hold Hamstring stretch (with strap) - 1 x daily - 3 sets - 30 seconds hold Squat with Counter Support - 1 x daily - 3-4 x weekly - 2-3 sets - 10 reps Side Stepping with Resistance at Thighs - 3-4 x weekly - 1-3 sets - 20 reps Forward Monster Walk with Resistance (BKA) - 3-4 x weekly - 1-3 sets - 20 reps Backward Monster Walk with Resistance (BKA) - 3-4 x weekly - 1-33 sets - 20 reps     PT Education - 12/16/20 1710     Education Details Exercise purpose/form. Self management techniques. Education on HEP including handout    Person(s) Educated Patient    Methods Explanation;Demonstration;Tactile cues;Verbal cues;Handout    Comprehension Verbalized understanding;Returned demonstration;Verbal cues required;Tactile cues required;Need further instruction              PT Short Term Goals - 12/10/20 1108       PT SHORT TERM GOAL #1   Title Be independent with initial home exercise program for self-management of symptoms.    Baseline  Initial HEP to be provided at visit 2 as appropriate (12/09/2020);    Time 2    Period Weeks    Status New    Target Date 12/24/20               PT Long Term Goals - 12/10/20 1109       PT LONG TERM GOAL #1   Title Be independent with a long-term home exercise program for self-management of symptoms.    Baseline Initial HEP to be provided at visit 2 as appropriate (12/09/2020);    Time 12    Period Weeks    Status New   TARGET DATE FOR ALL LONG TERM GOALS: 03/03/2021     PT LONG TERM GOAL #2   Title Demonstrate improved FOTO to equal or greater than 67 by visit #11 to demonstrate improvement in overall condition and self-reported functional ability.    Baseline 57 (12/09/2020);    Time 12    Period Weeks    Status New      PT LONG TERM GOAL #3  Title Reduce pain with functional activities to equal or less than 1/10 to allow patient to complete usual activities including housework, walking, playing tennis with less difficulty    Baseline up to 8/10 (12/09/2020);    Time 12    Period Weeks    Status New      PT LONG TERM GOAL #4   Title Be able to squat to chair height 1x10 reps with proper form without limitation due to current condition in order to improve ability to lift and complete transfers during usual activities such as tennis.    Baseline Squat (self selected): knees collapse inward touching, knees well over toes, spine rounded, appears crouch-like (12/09/2020);    Time 12    Period Weeks    Status New      PT LONG TERM GOAL #5   Title Improve B hip strength to equal or greater than 4+/5 with no increase in pain to improve patient's ability to complete functional tasks such as  walking and tennis with less difficulty.    Baseline as low as 4/5 (12/09/2020);    Time 12    Period Weeks    Status New      Additional Long Term Goals   Additional Long Term Goals Yes      PT LONG TERM GOAL #6   Title Complete community, work and/or recreational activities  without limitation due to current condition.    Baseline Functional Limitations: decreased walking, playing tennis, yardwork, gardening, walking on uneven ground (12/09/2020);    Time 12    Period Weeks    Status New                   Plan - 12/16/20 1714     Clinical Impression Statement Patient tolerated treatment well overall and was able to perform exercises with acceptable form after cuing. Established initial HEP to include daily and less frequent exercises. Patient expressed at end of session some concern over how difficult it is to flex R knee and would benefit from addressing this more next session. Patient would benefit from continued management of limiting condition by skilled physical therapist to address remaining impairments and functional limitations to work towards stated goals and return to PLOF or maximal functional independence.    Personal Factors and Comorbidities Past/Current Experience;Time since onset of injury/illness/exacerbation    Examination-Activity Limitations Squat;Stairs;Locomotion Level    Examination-Participation Restrictions Community Activity;Interpersonal Relationship;Yard Work   decreased walking, playing tennis, yardwork, gardening, walking on uneven ground.   Stability/Clinical Decision Making Stable/Uncomplicated    Rehab Potential Good    PT Frequency 2x / week    PT Duration 12 weeks    PT Treatment/Interventions ADLs/Self Care Home Management;Aquatic Therapy;Cryotherapy;Moist Heat;Electrical Stimulation;Gait training;Stair training;Functional mobility training;Therapeutic activities;Therapeutic exercise;Balance training;Neuromuscular re-education;Manual techniques;Dry needling;Passive range of motion    PT Next Visit Plan LE strength, ROM, balance, motor control training, manual as need    PT Home Exercise Plan Medbridge Access Code: [email or web address]    Consulted and Agree with Plan of Care Patient             Patient will benefit from  skilled therapeutic intervention in order to improve the following deficits and impairments:  Improper body mechanics, Pain, Decreased mobility, Decreased activity tolerance, Decreased endurance, Decreased range of motion, Decreased strength, Impaired perceived functional ability, Hypomobility, Decreased balance, Difficulty walking  Visit Diagnosis: Chronic pain of right knee  Stiffness of right knee, not elsewhere classified  Difficulty in walking, not elsewhere classified  Muscle weakness (generalized)     Problem List Patient Active Problem List   Diagnosis Date Noted   Preventative health care 01/11/2016   Insomnia 01/01/2014   HYPERCHOLESTEROLEMIA 10/04/2006   ENDOMETRIOSIS 10/04/2006    Everlean Alstrom. Graylon Good, PT, DPT 12/16/20, 5:14 PM   [facility] [address] [place], [address] Phone: [phone]   Fax:  [phone]  Name: Natalie Sosa MRN: [id number] Date of Birth: Feb 16, 1945

## 2020-12-23 ENCOUNTER — Encounter: Payer: Self-pay | Admitting: Physical Therapy

## 2020-12-23 ENCOUNTER — Ambulatory Visit: Payer: Medicare Other | Admitting: Physical Therapy

## 2020-12-23 DIAGNOSIS — M25661 Stiffness of right knee, not elsewhere classified: Secondary | ICD-10-CM | POA: Diagnosis not present

## 2020-12-23 DIAGNOSIS — R262 Difficulty in walking, not elsewhere classified: Secondary | ICD-10-CM

## 2020-12-23 DIAGNOSIS — G8929 Other chronic pain: Secondary | ICD-10-CM

## 2020-12-23 DIAGNOSIS — M6281 Muscle weakness (generalized): Secondary | ICD-10-CM | POA: Diagnosis not present

## 2020-12-23 DIAGNOSIS — M9903 Segmental and somatic dysfunction of lumbar region: Secondary | ICD-10-CM | POA: Diagnosis not present

## 2020-12-23 DIAGNOSIS — M9905 Segmental and somatic dysfunction of pelvic region: Secondary | ICD-10-CM | POA: Diagnosis not present

## 2020-12-23 DIAGNOSIS — M5136 Other intervertebral disc degeneration, lumbar region: Secondary | ICD-10-CM | POA: Diagnosis not present

## 2020-12-23 DIAGNOSIS — M25561 Pain in right knee: Secondary | ICD-10-CM | POA: Diagnosis not present

## 2020-12-23 DIAGNOSIS — M9901 Segmental and somatic dysfunction of cervical region: Secondary | ICD-10-CM | POA: Diagnosis not present

## 2020-12-23 NOTE — Therapy (Signed)
[facility] [address] [place], [address] Phone: [phone]   Fax:  [phone]  Physical Therapy Treatment  Patient Details  Name: Natalie Sosa MRN: [id number] Date of Birth: 01-23-1946 Referring Provider (PT): Persons, Bevely Palmer, [place]   Encounter Date: 12/23/2020   PT End of Session - 12/23/20 1132     Visit Number 3    Number of Visits 24    Date for PT Re-Evaluation 03/03/21    Authorization Type [employer] MEDICARE reporting period from 12/09/2020    Progress Note Due on Visit 10    PT Start Time 1115    PT Stop Time 1155    PT Time Calculation (min) 40 min    Activity Tolerance Patient tolerated treatment well    Behavior During Therapy [facility] for tasks assessed/performed             Past Medical History:  Diagnosis Date   Cancer [facility])    skin    Past Surgical History:  Procedure Laterality Date   ACHILLES TENDON REPAIR  06/14/2004   CESAREAN SECTION     placenta previa   CHOLECYSTECTOMY  06/13/2001    There were no vitals filed for this visit.   Subjective Assessment - 12/23/20 1131     Subjective Patient reports her pain is 1/10 upon arrival in the right lateral posterior knee. She was a little sore after last PT session but not bad. HEP is going well except she does get some pain with exercises on the frontal plane.    Pertinent History Patient is a 75 y.o. female who presents to outpatient physical therapy with a referral for medical diagnosis chronic pain of right knee, hamstring strain. This patient's chief complaints consist of R knee pain especially at the posterolateral portion, leading to the following functional deficits: difficulty with prolonged walking, playing tennis, yardwork, gardening, walking on uneven ground.   Relevant past medical history and comorbidities include skin cancer (2 years ago, still monitored, it was removed), insomnia, hypercholesterolemia, hx of right  achilles tendon repair 06/14/2004, cholecystectomy, hx back pain, some bone loss when scanned for osteoporosis. Patient denies hx of stroke, seizures, lung problem, major cardiac events, diabetes, unexplained weight loss, changes in bowel or bladder problems, new onset stumbling or dropping things, spinal surgery.    Limitations House hold activities;Walking   decreased walking, playing tennis, yardwork, gardening, walking on uneven ground.   Diagnostic tests Imaging 12/03/2020:  "XR Knee 1-2 Views Right     Result Date: 12/03/2020  2 view radiographs of her right knee were obtained today.  She has overall ever so slight valgus alignment.  She does have advanced degenerative changes of the patellofemoral joint with bone-on-bone loss of joint space and osteophyte at the superior pole of the patella.  She has degenerative changes of the lateral compartment and medial compartment the lateral compartment had as osteophyte formation and lateral compartment sclerotic changes as well as some subchondral cyst formation."    Patient Stated Goals "to not let it get any worse"    Currently in Pain? Yes    Pain Score 1     Pain Onset More than a month ago                TREATMENT:  Denies sensitivity to latex   Therapeutic exercise: to centralize symptoms and improve ROM, strength, muscular endurance, and activity tolerance required for successful completion of functional activities.  - Recumbent  Bike level 3. For improved lower extremity ROM, muscular endurance, and activity tolerance; and to induce the analgesic effect of aerobic exercise, stimulate joint nutrition, and prepare body structures and systems for following interventions. 5:34 minutes. Educated about exercise recommendations for health including aerobic, strength training, and balance training.  - Squats with BUE support focusing on glute activation and proper form with hip hinging, stabilized back, and tibial perpendicular to floor with knees  behind toes. 3x10.  (Manual therapy - see below) - standing hip abduction at hip machine, 3x10 each side, 40# (rated hard-medium difficulty).  - OMEGA knee extension, 2x10 each side, 5# cable. (rated medium-hard difficulty) - R step up to 8.5 inch step 1 rep (feels fine) - lateral lunge with BUE support 1x10 each side (feels fine) - seated R knee extension with self overpresure, 1x10 with 5 second hold (feels concordant pain each rep).  - R step up to 8.5 inch step 1 rep (feels a bit better) - lateral lunge with BUE support  3-5 reps each side (feels a bit better) - lateral lunge with B UE support 1x10 each side.  - SLS 1x30 seconds each side with touchdown support as needed.  - ball toss at rebounder with small pink theraball. 1x10 self selected stance then 3x10 each side SLS on firm surface.  -  Education on HEP including handout   Manual therapy: to reduce pain and tissue tension, improve range of motion, neuromodulation, in order to promote improved ability to complete functional activities. SEATED  - joint mobilizations to R tibiofemoral joint, grade III-IV, AP, PA, ER, IR - AP and PA joint mobilization to proximal tibiofibular joint PRONE - STM to right distal biceps femoris and proximal lateral gastroc head.  Noted for tenderness over posterior joint line but not in musculature.    Pt required multimodal cuing for proper technique and to facilitate improved neuromuscular control, strength, range of motion, and functional ability resulting in improved performance and form.   HOME EXERCISE PROGRAM Access Code: [email or web address] URL: [email or web address] Date: 12/16/2020 Prepared by: Rosita Kea   Exercises Standing Single Leg Stance with Counter Support - 1 x daily - 3 sets - 30 seconds hold Bridge with Hip Abduction and Resistance - Ground Touches - 1 x daily - 1 sets - 20 reps - 5 seconds hold Hamstring stretch (with strap) - 1 x daily - 3 sets - 30 seconds hold Squat  with Counter Support - 1 x daily - 3-4 x weekly - 2-3 sets - 10 reps Side Stepping with Resistance at Thighs - 3-4 x weekly - 1-3 sets - 20 reps Forward Monster Walk with Resistance (BKA) - 3-4 x weekly - 1-3 sets - 20 reps Backward Monster Walk with Resistance (BKA) - 3-4 x weekly - 1-33 sets - 20 reps   [email or web address] HOME EXERCISE PROGRAM [[id number]]  Repeated knee extension -  Repeat 20 Times, Hold 2 Seconds, Complete 2 Sets, Perform 1 Times a Day   PT Education - 12/23/20 1132     Education Details Exercise purpose/form. Self management techniques.    Person(s) Educated Patient    Methods Explanation;Demonstration;Tactile cues;Verbal cues    Comprehension Verbalized understanding;Returned demonstration;Verbal cues required;Tactile cues required;Need further instruction              PT Short Term Goals - 12/10/20 1108       PT SHORT TERM GOAL #1   Title Be independent with initial home exercise program for self-management of symptoms.    Baseline  Initial HEP to be provided at visit 2 as appropriate (12/09/2020);    Time 2    Period Weeks    Status New    Target Date 12/24/20               PT Long Term Goals - 12/10/20 1109       PT LONG TERM GOAL #1   Title Be independent with a long-term home exercise program for self-management of symptoms.    Baseline Initial HEP to be provided at visit 2 as appropriate (12/09/2020);    Time 12    Period Weeks    Status New   TARGET DATE FOR ALL LONG TERM GOALS: 03/03/2021     PT LONG TERM GOAL #2   Title Demonstrate improved FOTO to equal or greater than 67 by visit #11 to demonstrate improvement in overall condition and self-reported functional ability.    Baseline 57 (12/09/2020);    Time 12    Period Weeks    Status New      PT LONG TERM GOAL #3   Title Reduce pain with functional activities to equal or less than 1/10 to allow patient to complete usual activities including housework, walking, playing tennis with less  difficulty    Baseline up to 8/10 (12/09/2020);    Time 12    Period Weeks    Status New      PT LONG TERM GOAL #4   Title Be able to squat to chair height 1x10 reps with proper form without limitation due to current condition in order to improve ability to lift and complete transfers during usual activities such as tennis.    Baseline Squat (self selected): knees collapse inward touching, knees well over toes, spine rounded, appears crouch-like (12/09/2020);    Time 12    Period Weeks    Status New      PT LONG TERM GOAL #5   Title Improve B hip strength to equal or greater than 4+/5 with no increase in pain to improve patient's ability to complete functional tasks such as  walking and tennis with less difficulty.    Baseline as low as 4/5 (12/09/2020);    Time 12    Period Weeks    Status New      Additional Long Term Goals   Additional Long Term Goals Yes      PT LONG TERM GOAL #6   Title Complete community, work and/or recreational activities without limitation due to current condition.    Baseline Functional Limitations: decreased walking, playing tennis, yardwork, gardening, walking on uneven ground (12/09/2020);    Time 12    Period Weeks    Status New                   Plan - 12/23/20 1215     Clinical Impression Statement Patient tolerated treatment well overall and was able to progress to more difficult and weight bearing exercises. Introduced repeated R knee extension and plan to check effect on knee flexion next session. Patient plans to start riding recumbent bike at her clubhouse gym at home. Patient would benefit from continued management of limiting condition by skilled physical therapist to address remaining impairments and functional limitations to work towards stated goals and return to PLOF or maximal functional independence.    Personal Factors and Comorbidities Past/Current Experience;Time since onset of injury/illness/exacerbation     Examination-Activity Limitations Squat;Stairs;Locomotion Level    Examination-Participation Restrictions Community Activity;Interpersonal Relationship;[employer] Leaver Work  decreased walking, playing tennis, yardwork, gardening, walking on uneven ground.   Stability/Clinical Decision Making Stable/Uncomplicated    Rehab Potential Good    PT Frequency 2x / week    PT Duration 12 weeks    PT Treatment/Interventions ADLs/Self Care Home Management;Aquatic Therapy;Cryotherapy;Moist Heat;Electrical Stimulation;Gait training;Stair training;Functional mobility training;Therapeutic activities;Therapeutic exercise;Balance training;Neuromuscular re-education;Manual techniques;Dry needling;Passive range of motion    PT Next Visit Plan LE strength, ROM, balance, motor control training, manual as need    PT Home Exercise Plan Medbridge Access Code: [email or web address]    Consulted and Agree with Plan of Care Patient             Patient will benefit from skilled therapeutic intervention in order to improve the following deficits and impairments:  Improper body mechanics, Pain, Decreased mobility, Decreased activity tolerance, Decreased endurance, Decreased range of motion, Decreased strength, Impaired perceived functional ability, Hypomobility, Decreased balance, Difficulty walking  Visit Diagnosis: Chronic pain of right knee  Stiffness of right knee, not elsewhere classified  Difficulty in walking, not elsewhere classified  Muscle weakness (generalized)     Problem List Patient Active Problem List   Diagnosis Date Noted   Preventative health care 01/11/2016   Insomnia 01/01/2014   HYPERCHOLESTEROLEMIA 10/04/2006   ENDOMETRIOSIS 10/04/2006    Clarise Cruz R. Graylon Good, PT, DPT 12/23/20, 12:16 PM    [facility] [address] [place], [address] Phone: [phone]   Fax:  [phone]  Name: Natalie Sosa MRN: [id number] Date of Birth:  03/31/1945

## 2020-12-29 ENCOUNTER — Encounter: Payer: Self-pay | Admitting: Physical Therapy

## 2020-12-29 ENCOUNTER — Ambulatory Visit: Payer: Medicare Other | Attending: Physician Assistant | Admitting: Physical Therapy

## 2020-12-29 DIAGNOSIS — R262 Difficulty in walking, not elsewhere classified: Secondary | ICD-10-CM

## 2020-12-29 DIAGNOSIS — M25661 Stiffness of right knee, not elsewhere classified: Secondary | ICD-10-CM | POA: Diagnosis not present

## 2020-12-29 DIAGNOSIS — M6281 Muscle weakness (generalized): Secondary | ICD-10-CM

## 2020-12-29 DIAGNOSIS — G8929 Other chronic pain: Secondary | ICD-10-CM

## 2020-12-29 DIAGNOSIS — M25561 Pain in right knee: Secondary | ICD-10-CM | POA: Diagnosis not present

## 2020-12-29 NOTE — Therapy (Signed)
[facility] [address] [place], [address] Phone: [phone]   Fax:  [phone]  Physical Therapy Treatment  Patient Details  Name: Natalie Sosa MRN: [id number] Date of Birth: 1945-05-17 Referring Provider (PT): Persons, Bevely Palmer, [place]   Encounter Date: 12/29/2020   PT End of Session - 12/29/20 1523     Visit Number 4    Number of Visits 24    Date for PT Re-Evaluation 03/03/21    Authorization Type [employer] MEDICARE reporting period from 12/09/2020    Progress Note Due on Visit 10    PT Start Time 1519    PT Stop Time 1557    PT Time Calculation (min) 38 min    Activity Tolerance Patient tolerated treatment well    Behavior During Therapy [facility] for tasks assessed/performed             Past Medical History:  Diagnosis Date   Cancer [facility])    skin    Past Surgical History:  Procedure Laterality Date   ACHILLES TENDON REPAIR  06/14/2004   CESAREAN SECTION     placenta previa   CHOLECYSTECTOMY  06/13/2001    There were no vitals filed for this visit.   Subjective Assessment - 12/29/20 1521     Subjective Patient reports she is feeling well today. She had some pain in her right knee when doing banded lateral walks to the right with the band above her knees. States the pain does not continue after she is done with the exercise and it doesn't hurt going to the left or forwards/backwards monster walks. States she was a bit sore in the hips after last PT session. States her HEP is going well overall but did not go to the fitness room due to infectious disease concerns. Patient has not been to tennis this week for the same reason.    Pertinent History Patient is a 75 y.o. female who presents to outpatient physical therapy with a referral for medical diagnosis chronic pain of right knee, hamstring strain. This patient's chief complaints consist of R knee pain especially at the posterolateral portion, leading  to the following functional deficits: difficulty with prolonged walking, playing tennis, yardwork, gardening, walking on uneven ground.   Relevant past medical history and comorbidities include skin cancer (2 years ago, still monitored, it was removed), insomnia, hypercholesterolemia, hx of right achilles tendon repair 06/14/2004, cholecystectomy, hx back pain, some bone loss when scanned for osteoporosis. Patient denies hx of stroke, seizures, lung problem, major cardiac events, diabetes, unexplained weight loss, changes in bowel or bladder problems, new onset stumbling or dropping things, spinal surgery.    Limitations House hold activities;Walking   decreased walking, playing tennis, yardwork, gardening, walking on uneven ground.   Diagnostic tests Imaging 12/03/2020:  "XR Knee 1-2 Views Right     Result Date: 12/03/2020  2 view radiographs of her right knee were obtained today.  She has overall ever so slight valgus alignment.  She does have advanced degenerative changes of the patellofemoral joint with bone-on-bone loss of joint space and osteophyte at the superior pole of the patella.  She has degenerative changes of the lateral compartment and medial compartment the lateral compartment had as osteophyte formation and lateral compartment sclerotic changes as well as some subchondral cyst formation."    Patient Stated Goals "to not let it get any worse"    Currently in Pain? No/denies    Pain Onset More than  a month ago             TREATMENT:  Denies sensitivity to latex   Therapeutic exercise: to centralize symptoms and improve ROM, strength, muscular endurance, and activity tolerance required for successful completion of functional activities.  - Recumbent Bike level 5 at seat setting 14 For improved lower extremity ROM, muscular endurance, and activity tolerance; and to induce the analgesic effect of aerobic exercise, stimulate joint nutrition, and prepare body structures and systems for  following interventions. 5:34 minutes.  - Squats with BUE support focusing on glute activation and proper form with hip hinging, stabilized back, and tibial perpendicular to floor with knees behind toes. 1x30  - sit <> stand from 18 inch chair, 1x10 (slightly uncomfortable) - standing hip abduction at hip machine, 3x10 each side, 40# (rated RPE 6).  - OMEGA knee extension, 3x10 each side, 10# cable. (rated RPE 6) - ball toss at rebounder with small pink theraball. 3x10 each side SLS on firm surface. - OMEGA hamstring curl, 3x10 BLE, @ 20#.  - heel raises with forefeet on 2x4 board, 2x10, BUE support on wall.  - seated R knee flexion 1x30 seconds with foot planted and weight shited forward, 3x30 seconds with AAROM flexion using L LE to push R LE.  -  Education on HEP including handout      Pt required multimodal cuing for proper technique and to facilitate improved neuromuscular control, strength, range of motion, and functional ability resulting in improved performance and form.   HOME EXERCISE PROGRAM Access Code: [email or web address] URL: [email or web address] Date: 12/16/2020 Prepared by: Rosita Kea   Exercises Standing Single Leg Stance with Counter Support - 1 x daily - 3 sets - 30 seconds hold Bridge with Hip Abduction and Resistance - Ground Touches - 1 x daily - 1 sets - 20 reps - 5 seconds hold Hamstring stretch (with strap) - 1 x daily - 3 sets - 30 seconds hold Squat with Counter Support - 1 x daily - 3-4 x weekly - 2-3 sets - 10 reps Side Stepping with Resistance at Thighs - 3-4 x weekly - 1-3 sets - 20 reps Forward Monster Walk with Resistance (BKA) - 3-4 x weekly - 1-3 sets - 20 reps Backward Monster Walk with Resistance (BKA) - 3-4 x weekly - 1-33 sets - 20 reps   [email or web address] HOME EXERCISE PROGRAM [[id number]]   Repeated knee extension -  Repeat 20 Times, Hold 2 Seconds, Complete 2 Sets, Perform 1 Times a Day   PT Education - 12/29/20 1523     Education Details  Exercise purpose/form. Self management techniques.    Person(s) Educated Patient    Methods Explanation;Demonstration;Tactile cues;Verbal cues    Comprehension Verbalized understanding;Returned demonstration;Verbal cues required;Tactile cues required;Need further instruction              PT Short Term Goals - 12/29/20 1525       PT SHORT TERM GOAL #1   Title Be independent with initial home exercise program for self-management of symptoms.    Baseline Initial HEP to be provided at visit 2 as appropriate (12/09/2020);    Time 2    Period Weeks    Status Achieved    Target Date 12/24/20               PT Long Term Goals - 12/10/20 1109       PT LONG TERM GOAL #1   Title Be independent with a long-term home exercise program for self-management of  symptoms.    Baseline Initial HEP to be provided at visit 2 as appropriate (12/09/2020);    Time 12    Period Weeks    Status New   TARGET DATE FOR ALL LONG TERM GOALS: 03/03/2021     PT LONG TERM GOAL #2   Title Demonstrate improved FOTO to equal or greater than 67 by visit #11 to demonstrate improvement in overall condition and self-reported functional ability.    Baseline 57 (12/09/2020);    Time 12    Period Weeks    Status New      PT LONG TERM GOAL #3   Title Reduce pain with functional activities to equal or less than 1/10 to allow patient to complete usual activities including housework, walking, playing tennis with less difficulty    Baseline up to 8/10 (12/09/2020);    Time 12    Period Weeks    Status New      PT LONG TERM GOAL #4   Title Be able to squat to chair height 1x10 reps with proper form without limitation due to current condition in order to improve ability to lift and complete transfers during usual activities such as tennis.    Baseline Squat (self selected): knees collapse inward touching, knees well over toes, spine rounded, appears crouch-like (12/09/2020);    Time 12    Period Weeks    Status  New      PT LONG TERM GOAL #5   Title Improve B hip strength to equal or greater than 4+/5 with no increase in pain to improve patient's ability to complete functional tasks such as  walking and tennis with less difficulty.    Baseline as low as 4/5 (12/09/2020);    Time 12    Period Weeks    Status New      Additional Long Term Goals   Additional Long Term Goals Yes      PT LONG TERM GOAL #6   Title Complete community, work and/or recreational activities without limitation due to current condition.    Baseline Functional Limitations: decreased walking, playing tennis, yardwork, gardening, walking on uneven ground (12/09/2020);    Time 12    Period Weeks    Status New                   Plan - 12/29/20 1926     Clinical Impression Statement Patient tolerated treatment well overall with report of slight discomfort with some exercises but no worse by end of session. Continued working on knee, hip, and functional strength and proprioception and R knee ROM. Patient would benefit from continued management of limiting condition by skilled physical therapist to address remaining impairments and functional limitations to work towards stated goals and return to PLOF or maximal functional independence.    Personal Factors and Comorbidities Past/Current Experience;Time since onset of injury/illness/exacerbation    Examination-Activity Limitations Squat;Stairs;Locomotion Level    Examination-Participation Restrictions Community Activity;Interpersonal Relationship;Yard Work   decreased walking, playing tennis, yardwork, gardening, walking on uneven ground.   Stability/Clinical Decision Making Stable/Uncomplicated    Rehab Potential Sosa    PT Frequency 2x / week    PT Duration 12 weeks    PT Treatment/Interventions ADLs/Self Care Home Management;Aquatic Therapy;Cryotherapy;Moist Heat;Electrical Stimulation;Gait training;Stair training;Functional mobility training;Therapeutic  activities;Therapeutic exercise;Balance training;Neuromuscular re-education;Manual techniques;Dry needling;Passive range of motion    PT Next Visit Plan LE strength, ROM, balance, motor control training, manual as need    PT Home Exercise Plan Medbridge Access Code:  [email or web address]    Consulted and Agree with Plan of Care Patient             Patient will benefit from skilled therapeutic intervention in order to improve the following deficits and impairments:  Improper body mechanics, Pain, Decreased mobility, Decreased activity tolerance, Decreased endurance, Decreased range of motion, Decreased strength, Impaired perceived functional ability, Hypomobility, Decreased balance, Difficulty walking  Visit Diagnosis: Chronic pain of right knee  Stiffness of right knee, not elsewhere classified  Difficulty in walking, not elsewhere classified  Muscle weakness (generalized)     Problem List Patient Active Problem List   Diagnosis Date Noted   Preventative health care 01/11/2016   Insomnia 01/01/2014   HYPERCHOLESTEROLEMIA 10/04/2006   ENDOMETRIOSIS 10/04/2006    Natalie Sosa, PT, DPT 12/29/20, 7:27 PM   [facility] [address] [place], [address] Phone: [phone]   Fax:  [phone]  Name: Natalie Sosa MRN: [id number] Date of Birth: 11/24/1945

## 2020-12-31 ENCOUNTER — Ambulatory Visit: Payer: Medicare Other | Admitting: Orthopaedic Surgery

## 2020-12-31 ENCOUNTER — Telehealth (INDEPENDENT_AMBULATORY_CARE_PROVIDER_SITE_OTHER): Payer: Medicare Other | Admitting: Nurse Practitioner

## 2020-12-31 ENCOUNTER — Ambulatory Visit: Payer: Medicare Other | Admitting: Physical Therapy

## 2020-12-31 ENCOUNTER — Telehealth: Payer: Self-pay | Admitting: Primary Care

## 2020-12-31 ENCOUNTER — Other Ambulatory Visit: Payer: Self-pay

## 2020-12-31 ENCOUNTER — Telehealth: Payer: Medicare Other | Admitting: Nurse Practitioner

## 2020-12-31 ENCOUNTER — Encounter: Payer: Self-pay | Admitting: Nurse Practitioner

## 2020-12-31 VITALS — BP 126/66 | Temp 99.2°F

## 2020-12-31 DIAGNOSIS — U071 COVID-19: Secondary | ICD-10-CM | POA: Diagnosis not present

## 2020-12-31 HISTORY — DX: COVID-19: U07.1

## 2020-12-31 MED ORDER — MOLNUPIRAVIR EUA 200MG CAPSULE: 4.0000 | ORAL_CAPSULE | Freq: Two times a day (BID) | ORAL | 0 refills | Status: AC

## 2020-12-31 NOTE — Assessment & Plan Note (Signed)
Patient tested positive for COVID-19.  Did discuss antiviral treatment options.  Did discuss that they are EUA only and common side effects.  After joint discussion and decision making we will pursue antiviral treatment.  Did discuss signs and symptoms when to seek urgent or emergent health care.  Also discussed common side effects of antiviral regimen.  Also discussed quarantine guidelines in regards to [facility] recommendations.  Patient acknowledged Start molnupiravir as soon as possible

## 2020-12-31 NOTE — Telephone Encounter (Signed)
Pt called stating that she tested positive for covid. Pt would like advise on what to do. Please advise.

## 2020-12-31 NOTE — Telephone Encounter (Signed)
I spoke with pt;pt had +covid home test on 12/31/20; on 12/29/20 symptoms started with prod cough with ? Color of phlegm, scratchy S/T,fever 99.3, runny nose and head congestion. Itchy eye is better; pt seeing opt homologist about dry eye. No other covid symptoms noted. Pt scheduled video visit on 12/31/20 at 2 PM with Romilda Garret NP. Self quarantine, drink plenty of fluids, rest, and take Tylenol for fever. UC & ED precautions given and pt voiced understanding. Sending note to Gentry Fitz NP as PCP, Romilda Garret NP and Azalee Course CMA. Will teams Anastasiya also.

## 2020-12-31 NOTE — Telephone Encounter (Signed)
Will see and eval at office visit. Thank you

## 2020-12-31 NOTE — Progress Notes (Signed)
Patient ID: Natalie Sosa, female    DOB: 04-14-1945, 75 y.o.   MRN: [id number]  Virtual visit completed through [place], a video enabled telemedicine application. Due to national recommendations of social distancing due to COVID-19, a virtual visit is felt to be most appropriate for this patient at this time. Reviewed limitations, risks, security and privacy concerns of performing a virtual visit and the availability of in person appointments. I also reviewed that there may be a patient responsible charge related to this service. The patient agreed to proceed.   Unable to connect via video enabled device, reverted to phone call  Phone call lasted 8 minutes  Patient location: home Provider location: [place] at [facility], office Persons participating in this virtual visit: patient, provider   If any vitals were documented, they were collected by patient at home unless specified below.    BP 126/66   Temp 99.2 F (37.3 C) Comment: per patient   CC: Covid 19 Subjective:   HPI: Natalie Sosa is a 75 y.o. female presenting on 12/31/2020 for Covid Positive (On 12/31/20, sx started on 12/6-cough, runny nose, head congestion, sore throat. No fever. Has taking Tylenol for symptoms.)  Symptoms started on 12/29/2020 Tested for Covid today and it was positive Pfizer x2 and 3 boosters Husband is sick contact and has Covid  Tylenol OTC for symptoms with some relief    Relevant past medical, surgical, family and social history reviewed and updated as indicated. Interim medical history since our last visit reviewed. Allergies and medications reviewed and updated. Outpatient Medications Prior to Visit  Medication Sig Dispense Refill   CALCIUM-VITAMIN D PO Take 2 tablets by mouth daily.     ibuprofen (ADVIL,MOTRIN) 200 MG tablet Use as directed     Multiple Vitamin (MULTIVITAMIN) tablet Take 1 tablet by mouth daily.     simvastatin (ZOCOR) 20 MG tablet TAKE 1 TABLET(20 MG) BY MOUTH AT  BEDTIME for cholesterol. 90 tablet 3   No facility-administered medications prior to visit.     Per HPI unless specifically indicated in ROS section below Review of Systems  Constitutional:  Positive for fatigue. Negative for chills and fever.  HENT:  Positive for congestion and sore throat. Negative for ear discharge, ear pain, sinus pressure and sinus pain.   Respiratory:  Positive for cough (clear). Negative for shortness of breath.   Cardiovascular:  Negative for chest pain.  Gastrointestinal:  Negative for abdominal pain, diarrhea, nausea and vomiting.  Musculoskeletal:  Negative for arthralgias and myalgias.  Neurological:  Negative for headaches.  Objective:  BP 126/66   Temp 99.2 F (37.3 C) Comment: per patient  Wt Readings from Last 3 Encounters:  09/14/20 164 lb 12 oz (74.7 kg)  02/03/20 169 lb (76.7 kg)  02/01/19 166 lb 4 oz (75.4 kg)       Physical exam: Gen: alert, NAD, not ill appearing Pulm: speaks in complete sentences without increased work of breathing Psych: normal mood, normal thought content      Results for orders placed or performed in visit on 02/04/20  Fecal occult blood, imunochemical   Specimen: Stool  Result Value Ref Range   Fecal Occult Bld Negative Negative   Assessment & Plan:   Problem List Items Addressed This Visit       Other   COVID-19 - Primary    Patient tested positive for COVID-19.  Did discuss antiviral treatment options.  Did discuss that they are EUA only and common side  effects.  After joint discussion and decision making we will pursue antiviral treatment.  Did discuss signs and symptoms when to seek urgent or emergent health care.  Also discussed common side effects of antiviral regimen.  Also discussed quarantine guidelines in regards to [facility] recommendations.  Patient acknowledged Start molnupiravir as soon as possible      Relevant Medications   molnupiravir EUA (LAGEVRIO) 200 mg CAPS capsule     No orders of the  defined types were placed in this encounter.  No orders of the defined types were placed in this encounter.   I discussed the assessment and treatment plan with the patient. The patient was provided an opportunity to ask questions and all were answered. The patient agreed with the plan and demonstrated an understanding of the instructions. The patient was advised to call back or seek an in-person evaluation if the symptoms worsen or if the condition fails to improve as anticipated.  Follow up plan: No follow-ups on file.  Romilda Garret, NP

## 2020-12-31 NOTE — Telephone Encounter (Signed)
Noted  

## 2021-01-01 ENCOUNTER — Telehealth: Payer: Medicare Other | Admitting: Nurse Practitioner

## 2021-01-05 ENCOUNTER — Encounter: Payer: Self-pay | Admitting: Physical Therapy

## 2021-01-05 ENCOUNTER — Ambulatory Visit: Payer: Medicare Other | Admitting: Physical Therapy

## 2021-01-05 DIAGNOSIS — M6281 Muscle weakness (generalized): Secondary | ICD-10-CM | POA: Diagnosis not present

## 2021-01-05 DIAGNOSIS — M25661 Stiffness of right knee, not elsewhere classified: Secondary | ICD-10-CM | POA: Diagnosis not present

## 2021-01-05 DIAGNOSIS — R262 Difficulty in walking, not elsewhere classified: Secondary | ICD-10-CM | POA: Diagnosis not present

## 2021-01-05 DIAGNOSIS — M25561 Pain in right knee: Secondary | ICD-10-CM | POA: Diagnosis not present

## 2021-01-05 DIAGNOSIS — G8929 Other chronic pain: Secondary | ICD-10-CM | POA: Diagnosis not present

## 2021-01-05 NOTE — Therapy (Signed)
[facility] [address] [place], [address] Phone: [phone]   Fax:  [phone]  Physical Therapy Treatment  Patient Details  Name: Natalie Sosa MRN: [id number] Date of Birth: [birth year] Referring Provider (PT): Persons, Bevely Palmer, [place]   Encounter Date: 01/05/2021   PT End of Session - 01/05/21 1437     Visit Number 5    Number of Visits 24    Date for PT Re-Evaluation 03/03/21    Authorization Type [employer] MEDICARE reporting period from 12/09/2020    Progress Note Due on Visit 10    PT Start Time 1435    PT Stop Time 1513    PT Time Calculation (min) 38 min    Activity Tolerance Patient tolerated treatment well    Behavior During Therapy [facility] for tasks assessed/performed             Past Medical History:  Diagnosis Date   Cancer [facility])    skin    Past Surgical History:  Procedure Laterality Date   ACHILLES TENDON REPAIR  06/14/2004   CESAREAN SECTION     placenta previa   CHOLECYSTECTOMY  06/13/2001    There were no vitals filed for this visit.   Subjective Assessment - 01/05/21 1435     Subjective Patient tested positive for COVID19 since last PT session. She had the symptoms of a head cold but no complications and she is feeling well again. States she has no knee pain upon arrival. She has not been playing tennis for the last 2 weeks due to her husband and her having [place]. She states her R knee hurts when she does the flexion stretch but only during the stretch. She was not able to do all of her HEP every day since last PT session.    Pertinent History Patient is a 75 y.o. female who presents to outpatient physical therapy with a referral for medical diagnosis chronic pain of right knee, hamstring strain. This patient's chief complaints consist of R knee pain especially at the posterolateral portion, leading to the following functional deficits: difficulty with prolonged walking, playing  tennis, yardwork, gardening, walking on uneven ground.   Relevant past medical history and comorbidities include skin cancer (2 years ago, still monitored, it was removed), insomnia, hypercholesterolemia, hx of right achilles tendon repair 06/14/2004, cholecystectomy, hx back pain, some bone loss when scanned for osteoporosis. Patient denies hx of stroke, seizures, lung problem, major cardiac events, diabetes, unexplained weight loss, changes in bowel or bladder problems, new onset stumbling or dropping things, spinal surgery.    Limitations House hold activities;Walking   decreased walking, playing tennis, yardwork, gardening, walking on uneven ground.   Diagnostic tests Imaging 12/03/2020:  "XR Knee 1-2 Views Right     Result Date: 12/03/2020  2 view radiographs of her right knee were obtained today.  She has overall ever so slight valgus alignment.  She does have advanced degenerative changes of the patellofemoral joint with bone-on-bone loss of joint space and osteophyte at the superior pole of the patella.  She has degenerative changes of the lateral compartment and medial compartment the lateral compartment had as osteophyte formation and lateral compartment sclerotic changes as well as some subchondral cyst formation."    Patient Stated Goals "to not let it get any worse"    Currently in Pain? No/denies            OBJECTIVE  SELF-REPORTED FUNCTION FOTO score: 67/100 (knee questionnaire)  TREATMENT:  Denies sensitivity to latex   Therapeutic exercise: to centralize symptoms and improve ROM, strength, muscular endurance, and activity tolerance required for successful completion of functional activities.  - NuStep level 3 using bilateral lower extremities. Seat setting 11. For improved extremity mobility, muscular endurance, and activity tolerance; and to induce the analgesic effect of aerobic exercise, stimulate improved joint nutrition, and prepare body structures and systems for following  interventions. X 5:14  minutes. Average SPM = 71 - Squats with BUE support focusing on glute activation and proper form with hip hinging, stabilized back, and tibial perpendicular to floor with knees behind toes. 3x10. Cued for deeper squat and mild hip abduction.  - OMEGA hamstring curl, 3x10 BLE, @ 25#. (Rated medium difficulty).  - OMEGA knee extension, 3x10 each side, 15# cable. (RPE 7.5) - standing hip abduction at hip machine, 3x10 each side, 55# (rated RPE 7).  - ball toss at rebounder with small pink theraball. 3x10 each side SLS on firm surface. - heel raises with forefeet on 2x4 board, 3x10, BUE support on wall.  - lateral shuffle 5x15 feet each direction - lateral shuffle with ball toss, 5x15 feet each direction.  - forward mini-lunge push back alternating sides, 2x10 each side.    Pt required multimodal cuing for proper technique and to facilitate improved neuromuscular control, strength, range of motion, and functional ability resulting in improved performance and form.   HOME EXERCISE PROGRAM Access Code: [email or web address] URL: [email or web address] Date: 12/16/2020 Prepared by: Rosita Kea   Exercises Standing Single Leg Stance with Counter Support - 1 x daily - 3 sets - 30 seconds hold Bridge with Hip Abduction and Resistance - Ground Touches - 1 x daily - 1 sets - 20 reps - 5 seconds hold Hamstring stretch (with strap) - 1 x daily - 3 sets - 30 seconds hold Squat with Counter Support - 1 x daily - 3-4 x weekly - 2-3 sets - 10 reps Side Stepping with Resistance at Thighs - 3-4 x weekly - 1-3 sets - 20 reps Forward Monster Walk with Resistance (BKA) - 3-4 x weekly - 1-3 sets - 20 reps Backward Monster Walk with Resistance (BKA) - 3-4 x weekly - 1-33 sets - 20 reps   [email or web address] HOME EXERCISE PROGRAM [[email or web address]]   Repeated knee extension -  Repeat 20 Times, Hold 2 Seconds, Complete 2 Sets, Perform 1 Times a Day     PT Education - 01/05/21 1437     Education Details  Exercise purpose/form. Self management techniques.    Person(s) Educated Patient    Methods Explanation;Demonstration;Verbal cues    Comprehension Verbalized understanding;Returned demonstration;Verbal cues required;Need further instruction              PT Short Term Goals - 12/29/20 1525       PT SHORT TERM GOAL #1   Title Be independent with initial home exercise program for self-management of symptoms.    Baseline Initial HEP to be provided at visit 2 as appropriate (12/09/2020);    Time 2    Period Weeks    Status Achieved    Target Date 12/24/20               PT Long Term Goals - 12/10/20 1109       PT LONG TERM GOAL #1   Title Be independent with a long-term home exercise program for self-management of symptoms.    Baseline Initial HEP to be provided at visit 2 as appropriate (12/09/2020);  Time 12    Period Weeks    Status New   TARGET DATE FOR ALL LONG TERM GOALS: 03/03/2021     PT LONG TERM GOAL #2   Title Demonstrate improved FOTO to equal or greater than 67 by visit #11 to demonstrate improvement in overall condition and self-reported functional ability.    Baseline 57 (12/09/2020);    Time 12    Period Weeks    Status New      PT LONG TERM GOAL #3   Title Reduce pain with functional activities to equal or less than 1/10 to allow patient to complete usual activities including housework, walking, playing tennis with less difficulty    Baseline up to 8/10 (12/09/2020);    Time 12    Period Weeks    Status New      PT LONG TERM GOAL #4   Title Be able to squat to chair height 1x10 reps with proper form without limitation due to current condition in order to improve ability to lift and complete transfers during usual activities such as tennis.    Baseline Squat (self selected): knees collapse inward touching, knees well over toes, spine rounded, appears crouch-like (12/09/2020);    Time 12    Period Weeks    Status New      PT LONG TERM GOAL #5    Title Improve B hip strength to equal or greater than 4+/5 with no increase in pain to improve patients ability to complete functional tasks such as  walking and tennis with less difficulty.    Baseline as low as 4/5 (12/09/2020);    Time 12    Period Weeks    Status New      Additional Long Term Goals   Additional Long Term Goals Yes      PT LONG TERM GOAL #6   Title Complete community, work and/or recreational activities without limitation due to current condition.    Baseline Functional Limitations: decreased walking, playing tennis, yardwork, gardening, walking on uneven ground (12/09/2020);    Time 12    Period Weeks    Status New                   Plan - 01/05/21 1447     Clinical Impression Statement Patient tolerated treatment well today and continues with strengthening of B LE and proprioceptive exercises to improve R knee pain and control. Patient has not been playing tennis for the last two weeks due to illness at her home but plans to return next week. FOTO score shows significant improvement from 57 at initial eval to 67 at visit #5.Marland Kitchen Patient would benefit from continued management of limiting condition by skilled physical therapist to address remaining impairments and functional limitations to work towards stated goals and return to PLOF or maximal functional independence.    Personal Factors and Comorbidities Past/Current Experience;Time since onset of injury/illness/exacerbation    Examination-Activity Limitations Squat;Stairs;Locomotion Level    Examination-Participation Restrictions Community Activity;Interpersonal Relationship;Yard Work   decreased walking, playing tennis, yardwork, gardening, walking on uneven ground.   Stability/Clinical Decision Making Stable/Uncomplicated    Rehab Potential Good    PT Frequency 2x / week    PT Duration 12 weeks    PT Treatment/Interventions ADLs/Self Care Home Management;Aquatic Therapy;Cryotherapy;Moist Heat;Electrical  Stimulation;Gait training;Stair training;Functional mobility training;Therapeutic activities;Therapeutic exercise;Balance training;Neuromuscular re-education;Manual techniques;Dry needling;Passive range of motion    PT Next Visit Plan LE strength, ROM, balance, motor control training, manual as need  PT Home Exercise Plan Medbridge Access Code: [email or web address]    Consulted and Agree with Plan of Care Patient             Patient will benefit from skilled therapeutic intervention in order to improve the following deficits and impairments:  Improper body mechanics, Pain, Decreased mobility, Decreased activity tolerance, Decreased endurance, Decreased range of motion, Decreased strength, Impaired perceived functional ability, Hypomobility, Decreased balance, Difficulty walking  Visit Diagnosis: Chronic pain of right knee  Stiffness of right knee, not elsewhere classified  Difficulty in walking, not elsewhere classified  Muscle weakness (generalized)     Problem List Patient Active Problem List   Diagnosis Date Noted   COVID-19 12/31/2020   Preventative health care 01/11/2016   Insomnia 01/01/2014   HYPERCHOLESTEROLEMIA 10/04/2006   ENDOMETRIOSIS 10/04/2006    Clarise Cruz R. Graylon Good, PT, DPT 01/05/21, 3:13 PM   [facility] [address], [place], [address] Phone: [phone]   Fax:  [phone]  Name: TONEY LIZAOLA MRN: [id number] Date of Birth: 08/26/1945

## 2021-01-07 ENCOUNTER — Encounter: Payer: Self-pay | Admitting: Physical Therapy

## 2021-01-07 ENCOUNTER — Ambulatory Visit: Payer: Medicare Other | Admitting: Physical Therapy

## 2021-01-07 DIAGNOSIS — M25661 Stiffness of right knee, not elsewhere classified: Secondary | ICD-10-CM | POA: Diagnosis not present

## 2021-01-07 DIAGNOSIS — M25561 Pain in right knee: Secondary | ICD-10-CM | POA: Diagnosis not present

## 2021-01-07 DIAGNOSIS — R262 Difficulty in walking, not elsewhere classified: Secondary | ICD-10-CM

## 2021-01-07 DIAGNOSIS — M6281 Muscle weakness (generalized): Secondary | ICD-10-CM | POA: Diagnosis not present

## 2021-01-07 DIAGNOSIS — G8929 Other chronic pain: Secondary | ICD-10-CM | POA: Diagnosis not present

## 2021-01-07 NOTE — Therapy (Signed)
[facility] [address], [place], [address] Phone: [phone]   Fax:  [phone]  Physical Therapy Treatment  Patient Details  Name: Natalie Sosa MRN: [id number] Date of Birth: 1945/04/03 Referring Provider (PT): Persons, Bevely Palmer, [place]   Encounter Date: 01/07/2021   PT End of Session - 01/07/21 1648     Visit Number 6    Number of Visits 24    Date for PT Re-Evaluation 03/03/21    Authorization Type [employer] MEDICARE reporting period from 12/09/2020    Progress Note Due on Visit 10    PT Start Time 1645    PT Stop Time 1725    PT Time Calculation (min) 40 min    Activity Tolerance Patient tolerated treatment well    Behavior During Therapy [facility] for tasks assessed/performed             Past Medical History:  Diagnosis Date   Cancer [facility])    skin    Past Surgical History:  Procedure Laterality Date   ACHILLES TENDON REPAIR  06/14/2004   CESAREAN SECTION     placenta previa   CHOLECYSTECTOMY  06/13/2001    There were no vitals filed for this visit.   Subjective Assessment - 01/07/21 1644     Subjective Patient reports she is doing well. States she has no pain upon arrival and her R knee has not hurt for "a long time." States her hips were sore after last PT session.    Pertinent History Patient is a 75 y.o. female who presents to outpatient physical therapy with a referral for medical diagnosis chronic pain of right knee, hamstring strain. This patient's chief complaints consist of R knee pain especially at the posterolateral portion, leading to the following functional deficits: difficulty with prolonged walking, playing tennis, yardwork, gardening, walking on uneven ground.   Relevant past medical history and comorbidities include skin cancer (2 years ago, still monitored, it was removed), insomnia, hypercholesterolemia, hx of right achilles tendon repair 06/14/2004, cholecystectomy, hx back pain,  some bone loss when scanned for osteoporosis. Patient denies hx of stroke, seizures, lung problem, major cardiac events, diabetes, unexplained weight loss, changes in bowel or bladder problems, new onset stumbling or dropping things, spinal surgery.    Limitations House hold activities;Walking   decreased walking, playing tennis, yardwork, gardening, walking on uneven ground.   Diagnostic tests Imaging 12/03/2020:  "XR Knee 1-2 Views Right     Result Date: 12/03/2020  2 view radiographs of her right knee were obtained today.  She has overall ever so slight valgus alignment.  She does have advanced degenerative changes of the patellofemoral joint with bone-on-bone loss of joint space and osteophyte at the superior pole of the patella.  She has degenerative changes of the lateral compartment and medial compartment the lateral compartment had as osteophyte formation and lateral compartment sclerotic changes as well as some subchondral cyst formation."    Patient Stated Goals "to not let it get any worse"    Currently in Pain? No/denies               TREATMENT:  Denies sensitivity to latex   Therapeutic exercise: to centralize symptoms and improve ROM, strength, muscular endurance, and activity tolerance required for successful completion of functional activities.  - NuStep level 4 using bilateral lower extremities. Seat setting 11. For improved extremity mobility, muscular endurance, and activity tolerance; and to induce the analgesic effect of aerobic exercise,  stimulate improved joint nutrition, and prepare body structures and systems for following interventions. x 5:04 minutes. Average SPM = 73 - Squats with BUE support focusing on glute activation and proper form with hip hinging, stabilized back, and tibial perpendicular to floor with knees behind toes. 3x10. Cued for deeper squat and mild hip abduction.  - OMEGA hamstring curl, 3x10 BLE, @ 30#. (RPE 7-8).  - OMEGA knee extension, 3x10 each  side, 20# cable. (RPE 9) - standing hip abduction at hip machine, 3x10 each side, 55# (rated RPE 7).  - ball toss at rebounder with small pink theraball. 3x15 each side SLS on firm surface. - heel raises with forefeet on 2x4 board, 3x15, BUE support on TM bar.  - forward mini-lunge push back alternating sides, 3x10 each side.  - lateral lunge onto 4 inch step, 2x10 each side, no UE support.    Pt required multimodal cuing for proper technique and to facilitate improved neuromuscular control, strength, range of motion, and functional ability resulting in improved performance and form.   HOME EXERCISE PROGRAM Access Code: [email or web address] URL: [email or web address] Date: 12/16/2020 Prepared by: Rosita Kea   Exercises Standing Single Leg Stance with Counter Support - 1 x daily - 3 sets - 30 seconds hold Bridge with Hip Abduction and Resistance - Ground Touches - 1 x daily - 1 sets - 20 reps - 5 seconds hold Hamstring stretch (with strap) - 1 x daily - 3 sets - 30 seconds hold Squat with Counter Support - 1 x daily - 3-4 x weekly - 2-3 sets - 10 reps Side Stepping with Resistance at Thighs - 3-4 x weekly - 1-3 sets - 20 reps Forward Monster Walk with Resistance (BKA) - 3-4 x weekly - 1-3 sets - 20 reps Backward Monster Walk with Resistance (BKA) - 3-4 x weekly - 1-33 sets - 20 reps   [email or web address] HOME EXERCISE PROGRAM [[email or web address]]   Repeated knee extension -  Repeat 20 Times, Hold 2 Seconds, Complete 2 Sets, Perform 1 Times a Day      PT Education - 01/07/21 1648     Education Details Exercise purpose/form. Self management techniques.    Person(s) Educated Patient    Methods Explanation;Demonstration;Verbal cues    Comprehension Verbalized understanding;Returned demonstration;Verbal cues required;Need further instruction              PT Short Term Goals - 12/29/20 1525       PT SHORT TERM GOAL #1   Title Be independent with initial home exercise program for  self-management of symptoms.    Baseline Initial HEP to be provided at visit 2 as appropriate (12/09/2020);    Time 2    Period Weeks    Status Achieved    Target Date 12/24/20               PT Long Term Goals - 12/10/20 1109       PT LONG TERM GOAL #1   Title Be independent with a long-term home exercise program for self-management of symptoms.    Baseline Initial HEP to be provided at visit 2 as appropriate (12/09/2020);    Time 12    Period Weeks    Status New   TARGET DATE FOR ALL LONG TERM GOALS: 03/03/2021     PT LONG TERM GOAL #2   Title Demonstrate improved FOTO to equal or greater than 67 by visit #11 to demonstrate improvement in overall condition and self-reported functional ability.    Baseline 57 (12/09/2020);  Time 12    Period Weeks    Status New      PT LONG TERM GOAL #3   Title Reduce pain with functional activities to equal or less than 1/10 to allow patient to complete usual activities including housework, walking, playing tennis with less difficulty    Baseline up to 8/10 (12/09/2020);    Time 12    Period Weeks    Status New      PT LONG TERM GOAL #4   Title Be able to squat to chair height 1x10 reps with proper form without limitation due to current condition in order to improve ability to lift and complete transfers during usual activities such as tennis.    Baseline Squat (self selected): knees collapse inward touching, knees well over toes, spine rounded, appears crouch-like (12/09/2020);    Time 12    Period Weeks    Status New      PT LONG TERM GOAL #5   Title Improve B hip strength to equal or greater than 4+/5 with no increase in pain to improve patients ability to complete functional tasks such as  walking and tennis with less difficulty.    Baseline as low as 4/5 (12/09/2020);    Time 12    Period Weeks    Status New      Additional Long Term Goals   Additional Long Term Goals Yes      PT LONG TERM GOAL #6   Title Complete  community, work and/or recreational activities without limitation due to current condition.    Baseline Functional Limitations: decreased walking, playing tennis, yardwork, gardening, walking on uneven ground (12/09/2020);    Time 12    Period Weeks    Status New                   Plan - 01/07/21 1724     Clinical Impression Statement Patient arrives with no pain but has not been playing tennis for the last 2 weeks. She tolerated treatment well and was able to advance exercises. Patient would benefit from continued management of limiting condition by skilled physical therapist to address remaining impairments and functional limitations to work towards stated goals and return to PLOF or maximal functional independence.    Personal Factors and Comorbidities Past/Current Experience;Time since onset of injury/illness/exacerbation    Examination-Activity Limitations Squat;Stairs;Locomotion Level    Examination-Participation Restrictions Community Activity;Interpersonal Relationship;Yard Work   decreased walking, playing tennis, yardwork, gardening, walking on uneven ground.   Stability/Clinical Decision Making Stable/Uncomplicated    Rehab Potential Good    PT Frequency 2x / week    PT Duration 12 weeks    PT Treatment/Interventions ADLs/Self Care Home Management;Aquatic Therapy;Cryotherapy;Moist Heat;Electrical Stimulation;Gait training;Stair training;Functional mobility training;Therapeutic activities;Therapeutic exercise;Balance training;Neuromuscular re-education;Manual techniques;Dry needling;Passive range of motion    PT Next Visit Plan LE strength, ROM, balance, motor control training, manual as need    PT Home Exercise Plan Medbridge Access Code: [email or web address]    Consulted and Agree with Plan of Care Patient             Patient will benefit from skilled therapeutic intervention in order to improve the following deficits and impairments:  Improper body mechanics, Pain, Decreased  mobility, Decreased activity tolerance, Decreased endurance, Decreased range of motion, Decreased strength, Impaired perceived functional ability, Hypomobility, Decreased balance, Difficulty walking  Visit Diagnosis: Chronic pain of right knee  Stiffness of right knee, not elsewhere classified  Difficulty in walking, not elsewhere classified  Muscle  weakness (generalized)     Problem List Patient Active Problem List   Diagnosis Date Noted   COVID-19 12/31/2020   Preventative health care 01/11/2016   Insomnia 01/01/2014   HYPERCHOLESTEROLEMIA 10/04/2006   ENDOMETRIOSIS 10/04/2006    Everlean Alstrom. Graylon Good, PT, DPT 01/07/21, 5:25 PM   [facility] [address], [place], [address] Phone: [phone]   Fax:  [phone]  Name: Natalie Sosa MRN: [id number] Date of Birth: [birth year]

## 2021-01-12 ENCOUNTER — Encounter: Payer: Self-pay | Admitting: Physical Therapy

## 2021-01-12 ENCOUNTER — Ambulatory Visit: Payer: Medicare Other | Admitting: Physical Therapy

## 2021-01-12 DIAGNOSIS — M25561 Pain in right knee: Secondary | ICD-10-CM | POA: Diagnosis not present

## 2021-01-12 DIAGNOSIS — R262 Difficulty in walking, not elsewhere classified: Secondary | ICD-10-CM

## 2021-01-12 DIAGNOSIS — M6281 Muscle weakness (generalized): Secondary | ICD-10-CM

## 2021-01-12 DIAGNOSIS — G8929 Other chronic pain: Secondary | ICD-10-CM | POA: Diagnosis not present

## 2021-01-12 DIAGNOSIS — M25661 Stiffness of right knee, not elsewhere classified: Secondary | ICD-10-CM

## 2021-01-12 NOTE — Therapy (Signed)
[facility] [address] [place], [address] Phone: [phone]   Fax:  [phone]  Physical Therapy Treatment  Patient Details  Name: Natalie Sosa MRN: [id number] Date of Birth: May 29, 1945 Referring Provider (PT): Persons, Bevely Palmer, [place]   Encounter Date: 01/12/2021   PT End of Session - 01/12/21 1528     Visit Number 7    Number of Visits 24    Date for PT Re-Evaluation 03/03/21    Authorization Type [employer] MEDICARE reporting period from 12/09/2020    Progress Note Due on Visit 10    PT Start Time 1517    PT Stop Time 1555    PT Time Calculation (min) 38 min    Activity Tolerance Patient tolerated treatment well    Behavior During Therapy [facility] for tasks assessed/performed             Past Medical History:  Diagnosis Date   Cancer [facility])    skin    Past Surgical History:  Procedure Laterality Date   ACHILLES TENDON REPAIR  06/14/2004   CESAREAN SECTION     placenta previa   CHOLECYSTECTOMY  06/13/2001    There were no vitals filed for this visit.   Subjective Assessment - 01/12/21 1522     Subjective Patient reports she is doing well. She went to 1.5 hours of tennis clinic today. She thought she felt something at the posterior R knee but then it dissapeared. She felto kay after last PT session.    Pertinent History Patient is a 75 y.o. female who presents to outpatient physical therapy with a referral for medical diagnosis chronic pain of right knee, hamstring strain. This patient's chief complaints consist of R knee pain especially at the posterolateral portion, leading to the following functional deficits: difficulty with prolonged walking, playing tennis, yardwork, gardening, walking on uneven ground.   Relevant past medical history and comorbidities include skin cancer (2 years ago, still monitored, it was removed), insomnia, hypercholesterolemia, hx of right achilles tendon repair 06/14/2004,  cholecystectomy, hx back pain, some bone loss when scanned for osteoporosis. Patient denies hx of stroke, seizures, lung problem, major cardiac events, diabetes, unexplained weight loss, changes in bowel or bladder problems, new onset stumbling or dropping things, spinal surgery.    Limitations House hold activities;Walking   decreased walking, playing tennis, yardwork, gardening, walking on uneven ground.   Diagnostic tests Imaging 12/03/2020:  "XR Knee 1-2 Views Right     Result Date: 12/03/2020  2 view radiographs of her right knee were obtained today.  She has overall ever so slight valgus alignment.  She does have advanced degenerative changes of the patellofemoral joint with bone-on-bone loss of joint space and osteophyte at the superior pole of the patella.  She has degenerative changes of the lateral compartment and medial compartment the lateral compartment had as osteophyte formation and lateral compartment sclerotic changes as well as some subchondral cyst formation."    Patient Stated Goals "to not let it get any worse"    Currently in Pain? No/denies              TREATMENT:  Denies sensitivity to latex   Therapeutic exercise: to centralize symptoms and improve ROM, strength, muscular endurance, and activity tolerance required for successful completion of functional activities.  - NuStep level 4 using bilateral lower extremities. Seat setting 11. For improved extremity mobility, muscular endurance, and activity tolerance; and to induce the analgesic effect of  aerobic exercise, stimulate improved joint nutrition, and prepare body structures and systems for following interventions. x 5:04 minutes. Average SPM = 73 - OMEGA hamstring curl, 3x10 BLE, @ 30#. (RPE 7-8).  - OMEGA knee extension, 3x10 each side, 20# cable. (RPE 9) - standing hip abduction at hip machine, 3x10 each side, 55# (rated RPE 7).  - Squats with BUE support focusing on glute activation and proper form with hip  hinging, stabilized back, and tibial perpendicular to floor with knees behind toes. 3x10. Cued for improved LE alignment.  - single leg heel raises with forefeet on 2x4 board, 1x10, 1x5 each side BUE support on TM bar.  - ball toss at rebounder with small pink theraball. 3x15 each side SLS on firm surface. - forward mini-lunge push back alternating sides, 3x10 each side.  - lateral lunge onto 4 inch step, 3x10 each side, no UE support.    Pt required multimodal cuing for proper technique and to facilitate improved neuromuscular control, strength, range of motion, and functional ability resulting in improved performance and form.   HOME EXERCISE PROGRAM Access Code: [email or web address] URL: [email or web address] Date: 12/16/2020 Prepared by: Rosita Kea   Exercises Standing Single Leg Stance with Counter Support - 1 x daily - 3 sets - 30 seconds hold Bridge with Hip Abduction and Resistance - Ground Touches - 1 x daily - 1 sets - 20 reps - 5 seconds hold Hamstring stretch (with strap) - 1 x daily - 3 sets - 30 seconds hold Squat with Counter Support - 1 x daily - 3-4 x weekly - 2-3 sets - 10 reps Side Stepping with Resistance at Thighs - 3-4 x weekly - 1-3 sets - 20 reps Forward Monster Walk with Resistance (BKA) - 3-4 x weekly - 1-3 sets - 20 reps Backward Monster Walk with Resistance (BKA) - 3-4 x weekly - 1-33 sets - 20 reps   [email or web address] HOME EXERCISE PROGRAM [[id number]]   Repeated knee extension -  Repeat 20 Times, Hold 2 Seconds, Complete 2 Sets, Perform 1 Times a Day     PT Education - 01/12/21 1528     Education Details Exercise purpose/form. Self management techniques.    Person(s) Educated Patient    Methods Explanation;Demonstration;Verbal cues    Comprehension Verbalized understanding;Returned demonstration;Verbal cues required;Need further instruction              PT Short Term Goals - 12/29/20 1525       PT SHORT TERM GOAL #1   Title Be independent with  initial home exercise program for self-management of symptoms.    Baseline Initial HEP to be provided at visit 2 as appropriate (12/09/2020);    Time 2    Period Weeks    Status Achieved    Target Date 12/24/20               PT Long Term Goals - 12/10/20 1109       PT LONG TERM GOAL #1   Title Be independent with a long-term home exercise program for self-management of symptoms.    Baseline Initial HEP to be provided at visit 2 as appropriate (12/09/2020);    Time 12    Period Weeks    Status New   TARGET DATE FOR ALL LONG TERM GOALS: 03/03/2021     PT LONG TERM GOAL #2   Title Demonstrate improved FOTO to equal or greater than 67 by visit #11 to demonstrate improvement in overall condition and self-reported functional ability.  Baseline 57 (12/09/2020);    Time 12    Period Weeks    Status New      PT LONG TERM GOAL #3   Title Reduce pain with functional activities to equal or less than 1/10 to allow patient to complete usual activities including housework, walking, playing tennis with less difficulty    Baseline up to 8/10 (12/09/2020);    Time 12    Period Weeks    Status New      PT LONG TERM GOAL #4   Title Be able to squat to chair height 1x10 reps with proper form without limitation due to current condition in order to improve ability to lift and complete transfers during usual activities such as tennis.    Baseline Squat (self selected): knees collapse inward touching, knees well over toes, spine rounded, appears crouch-like (12/09/2020);    Time 12    Period Weeks    Status New      PT LONG TERM GOAL #5   Title Improve B hip strength to equal or greater than 4+/5 with no increase in pain to improve patients ability to complete functional tasks such as  walking and tennis with less difficulty.    Baseline as low as 4/5 (12/09/2020);    Time 12    Period Weeks    Status New      Additional Long Term Goals   Additional Long Term Goals Yes      PT LONG  TERM GOAL #6   Title Complete community, work and/or recreational activities without limitation due to current condition.    Baseline Functional Limitations: decreased walking, playing tennis, yardwork, gardening, walking on uneven ground (12/09/2020);    Time 12    Period Weeks    Status New                   Plan - 01/12/21 1535     Clinical Impression Statement Patient continues to progress towards goals and successfully returned to tennis activities today. Today's PT session continued to focus on improving LE strength and function. Patient would benefit from continued management of limiting condition by skilled physical therapist to address remaining impairments and functional limitations to work towards stated goals and return to PLOF or maximal functional independence.    Personal Factors and Comorbidities Past/Current Experience;Time since onset of injury/illness/exacerbation    Examination-Activity Limitations Squat;Stairs;Locomotion Level    Examination-Participation Restrictions Community Activity;Interpersonal Relationship;Yard Work   decreased walking, playing tennis, yardwork, gardening, walking on uneven ground.   Stability/Clinical Decision Making Stable/Uncomplicated    Rehab Potential Good    PT Frequency 2x / week    PT Duration 12 weeks    PT Treatment/Interventions ADLs/Self Care Home Management;Aquatic Therapy;Cryotherapy;Moist Heat;Electrical Stimulation;Gait training;Stair training;Functional mobility training;Therapeutic activities;Therapeutic exercise;Balance training;Neuromuscular re-education;Manual techniques;Dry needling;Passive range of motion    PT Next Visit Plan LE strength, ROM, balance, motor control training, manual as need    PT Home Exercise Plan Medbridge Access Code: [email or web address]    Consulted and Agree with Plan of Care Patient             Patient will benefit from skilled therapeutic intervention in order to improve the following deficits  and impairments:  Improper body mechanics, Pain, Decreased mobility, Decreased activity tolerance, Decreased endurance, Decreased range of motion, Decreased strength, Impaired perceived functional ability, Hypomobility, Decreased balance, Difficulty walking  Visit Diagnosis: Chronic pain of right knee  Stiffness of right knee, not elsewhere classified  Difficulty in walking,  not elsewhere classified  Muscle weakness (generalized)     Problem List Patient Active Problem List   Diagnosis Date Noted   COVID-19 12/31/2020   Preventative health care 01/11/2016   Insomnia 01/01/2014   HYPERCHOLESTEROLEMIA 10/04/2006   ENDOMETRIOSIS 10/04/2006    Everlean Alstrom. Graylon Good, PT, DPT 01/12/21, 3:55 PM   [facility] [address] [place], [address] Phone: [phone]   Fax:  [phone]  Name: Natalie Sosa MRN: [id number] Date of Birth: [birth year]

## 2021-01-14 ENCOUNTER — Encounter: Payer: Self-pay | Admitting: Physical Therapy

## 2021-01-14 ENCOUNTER — Ambulatory Visit: Payer: Medicare Other | Admitting: Physical Therapy

## 2021-01-14 DIAGNOSIS — M6281 Muscle weakness (generalized): Secondary | ICD-10-CM

## 2021-01-14 DIAGNOSIS — R262 Difficulty in walking, not elsewhere classified: Secondary | ICD-10-CM | POA: Diagnosis not present

## 2021-01-14 DIAGNOSIS — M25561 Pain in right knee: Secondary | ICD-10-CM

## 2021-01-14 DIAGNOSIS — G8929 Other chronic pain: Secondary | ICD-10-CM | POA: Diagnosis not present

## 2021-01-14 DIAGNOSIS — M25661 Stiffness of right knee, not elsewhere classified: Secondary | ICD-10-CM

## 2021-01-14 NOTE — Therapy (Signed)
[facility] [address], [place], [address] Phone: [phone]   Fax:  [phone]  Physical Therapy Treatment / Discharge Summary Dates of reporting from 12/09/2020 to 01/14/2021  Patient Details  Name: Natalie Sosa MRN: [id number] Date of Birth: 1945-04-17 Referring Provider (PT): Persons, Bevely Palmer, [place]   Encounter Date: 01/14/2021   PT End of Session - 01/14/21 1537     Visit Number 8    Number of Visits 24    Date for PT Re-Evaluation 03/03/21    Authorization Type [employer] MEDICARE reporting period from 12/09/2020    Progress Note Due on Visit 10    PT Start Time 1519    PT Stop Time 1559    PT Time Calculation (min) 40 min    Activity Tolerance Patient tolerated treatment well    Behavior During Therapy [facility] for tasks assessed/performed             Past Medical History:  Diagnosis Date   Cancer [facility])    skin    Past Surgical History:  Procedure Laterality Date   ACHILLES TENDON REPAIR  06/14/2004   CESAREAN SECTION     placenta previa   CHOLECYSTECTOMY  06/13/2001    There were no vitals filed for this visit.   Subjective Assessment - 01/14/21 1522     Subjective Patient reports she has no pain upon arrival. she states she did have some mild pain in the right posterior knee when she was driving home and a little throughout the day later. She thinks it may have been reacting to coming to PT after already being at tennis earlier in the day. She feels that she has a good grasp of her HEP and would like to trial moving to independent management with a long term HEP. She is planning to go to the fitness room after Christmas.    Pertinent History Patient is a 75 y.o. female who presents to outpatient physical therapy with a referral for medical diagnosis chronic pain of right knee, hamstring strain. This patient's chief complaints consist of R knee pain especially at the posterolateral portion,  leading to the following functional deficits: difficulty with prolonged walking, playing tennis, yardwork, gardening, walking on uneven ground.   Relevant past medical history and comorbidities include skin cancer (2 years ago, still monitored, it was removed), insomnia, hypercholesterolemia, hx of right achilles tendon repair 06/14/2004, cholecystectomy, hx back pain, some bone loss when scanned for osteoporosis. Patient denies hx of stroke, seizures, lung problem, major cardiac events, diabetes, unexplained weight loss, changes in bowel or bladder problems, new onset stumbling or dropping things, spinal surgery.    Limitations House hold activities;Walking   decreased walking, playing tennis, yardwork, gardening, walking on uneven ground.   Diagnostic tests Imaging 12/03/2020:  "XR Knee 1-2 Views Right     Result Date: 12/03/2020  2 view radiographs of her right knee were obtained today.  She has overall ever so slight valgus alignment.  She does have advanced degenerative changes of the patellofemoral joint with bone-on-bone loss of joint space and osteophyte at the superior pole of the patella.  She has degenerative changes of the lateral compartment and medial compartment the lateral compartment had as osteophyte formation and lateral compartment sclerotic changes as well as some subchondral cyst formation."    Patient Stated Goals "to not let it get any worse"    Currently in Pain? No/denies  OBJECTIVE  SELF-REPORTED FUNCTION FOTO score: 77/100 (knee questionnaire)  MUSCLE PERFORMANCE (MMT):  *Indicates pain 12/09/20 01/14/21 Date  Joint/Motion R/L R/L R/L  Shoulder        Hip        Flexion (L1, L2) 4/4 5/5 /  Extension (knee ext) 4+/4+ 5/5 /  Abduction 4+/4 5/5 /  Knee        Extension (L3) 5/5 5/5 /  Flexion (S2) 4+/5 5/5 /  Ankle/Foot        Dorsiflexion (L4) 5/5 / /  Great toe extension (L5) 4/4 / /  Eversion (S1) 5/4+ / /  Plantarflexion (S1) 5/5 / /  Comments:   12/09/2020: R resisted eccentric hamstring testing negative for pain in neutral or biased towards lateral or medial hamstrings.    FUNCTIONAL/BALANCE TESTS: Squat (self selected): patient with better form then at initial eval with slight knee valgus and knees well over toes. Pain at back of R knee with very deep squat.    TREATMENT:  Denies sensitivity to latex   Therapeutic exercise: to centralize symptoms and improve ROM, strength, muscular endurance, and activity tolerance required for successful completion of functional activities.  - NuStep level 4 using bilateral lower extremities. Seat setting 11. For improved extremity mobility, muscular endurance, and activity tolerance; and to induce the analgesic effect of aerobic exercise, stimulate improved joint nutrition, and prepare body structures and systems for following interventions. x 5:04 minutes. Average SPM = 73 - measurements to assess progress - see above - OMEGA hamstring curl, 3x10 BLE, @ 30#. (RPE 7).  - OMEGA knee extension, 3x10 each side, 20# cable. (RPE 9) - lateral lunge onto 4 inch step, 3x10 each side, no UE support.  - standing hip abduction at hip machine, 3x10 each side, 55# (rated RPE 8).    Pt required multimodal cuing for proper technique and to facilitate improved neuromuscular control, strength, range of motion, and functional ability resulting in improved performance and form.   HOME EXERCISE PROGRAM Access Code: [email or web address] URL: [email or web address] Date: 12/16/2020 Prepared by: Rosita Kea   Exercises Standing Single Leg Stance with Counter Support - 1 x daily - 3 sets - 30 seconds hold Bridge with Hip Abduction and Resistance - Ground Touches - 1 x daily - 1 sets - 20 reps - 5 seconds hold Hamstring stretch (with strap) - 1 x daily - 3 sets - 30 seconds hold Squat with Counter Support - 1 x daily - 3-4 x weekly - 2-3 sets - 10 reps Side Stepping with Resistance at Thighs - 3-4 x weekly -  1-3 sets - 20 reps Forward Monster Walk with Resistance (BKA) - 3-4 x weekly - 1-3 sets - 20 reps Backward Monster Walk with Resistance (BKA) - 3-4 x weekly - 1-33 sets - 20 reps   [email or web address] HOME EXERCISE PROGRAM [[email or web address]]   Repeated knee extension -  Repeat 20 Times, Hold 2 Seconds, Complete 2 Sets, Perform 1 Times a Day     PT Education - 01/14/21 1552     Education Details POC, progress, discharge reccomendations.    Person(s) Educated Patient    Methods Explanation;Demonstration;Verbal cues    Comprehension Verbalized understanding;Returned demonstration              PT Short Term Goals - 12/29/20 1525       PT SHORT TERM GOAL #1   Title Be independent with initial home exercise program for self-management of symptoms.    Baseline Initial HEP to be  provided at visit 2 as appropriate (12/09/2020);    Time 2    Period Weeks    Status Achieved    Target Date 12/24/20               PT Long Term Goals - 01/14/21 1525       PT LONG TERM GOAL #1   Title Be independent with a long-term home exercise program for self-management of symptoms.    Baseline Initial HEP to be provided at visit 2 as appropriate (12/09/2020); participating well in long term HEP (01/14/2021);    Time 12    Period Weeks    Status Achieved   TARGET DATE FOR ALL LONG TERM GOALS: 03/03/2021     PT LONG TERM GOAL #2   Title Demonstrate improved FOTO to equal or greater than 67 by visit #11 to demonstrate improvement in overall condition and self-reported functional ability.    Baseline 57 (12/09/2020); 67 at visit#5 (01/05/2021); 77 at visit #8 (01/14/2021);    Time 12    Period Weeks    Status Achieved      PT LONG TERM GOAL #3   Title Reduce pain with functional activities to equal or less than 1/10 to allow patient to complete usual activities including housework, walking, playing tennis with less difficulty    Baseline up to 8/10 (12/09/2020); up to 5/10 (11/14/2020);    Time 12    Period  Weeks    Status Partially Met      PT LONG TERM GOAL #4   Title Be able to squat to chair height 1x10 reps with proper form without limitation due to current condition in order to improve ability to lift and complete transfers during usual activities such as tennis.    Baseline Squat (self selected): knees collapse inward touching, knees well over toes, spine rounded, appears crouch-like (12/09/2020); patient with better form then at initial eval with slight knee valgus and knees well over toes. Pain at back of R knee with very deep squat (01/14/2021);    Time 12    Period Weeks    Status Partially Met      PT LONG TERM GOAL #5   Title Improve B hip strength to equal or greater than 4+/5 with no increase in pain to improve patients ability to complete functional tasks such as  walking and tennis with less difficulty.    Baseline as low as 4/5 (12/09/2020); 5/5 (01/14/2021);    Time 12    Period Weeks    Status Achieved      PT LONG TERM GOAL #6   Title Complete community, work and/or recreational activities without limitation due to current condition.    Baseline Functional Limitations: decreased walking, playing tennis, yardwork, gardening, walking on uneven ground (12/09/2020); she reports improvements but has not tried walking on uneven ground (01/14/2021); improvements in activities but still reports mild limitations (01/14/2021);    Time 12    Period Weeks    Status Partially Met                   Plan - 01/14/21 1548     Clinical Impression Statement Patient has attended 8 physical therapy sessions since starting this episode of care on 12/09/2020. Patient has demonstrated good progress towards goals and appears ready to discharge to independent management with long term HEP. Pateint has exceeded FOTO goal (self-reported function), met her strength goal, and has improved her pain and participation. She is independent with  an appropriate long term HEP and will not be  discharged due to improvement in her condition.    Personal Factors and Comorbidities Past/Current Experience;Time since onset of injury/illness/exacerbation    Examination-Activity Limitations Squat;Stairs;Locomotion Level    Examination-Participation Restrictions Community Activity;Interpersonal Relationship;Yard Work   decreased walking, playing tennis, yardwork, gardening, walking on uneven ground.   Stability/Clinical Decision Making Stable/Uncomplicated    Rehab Potential Good    PT Frequency 2x / week    PT Duration 12 weeks    PT Treatment/Interventions ADLs/Self Care Home Management;Aquatic Therapy;Cryotherapy;Moist Heat;Electrical Stimulation;Gait training;Stair training;Functional mobility training;Therapeutic activities;Therapeutic exercise;Balance training;Neuromuscular re-education;Manual techniques;Dry needling;Passive range of motion    PT Next Visit Plan Patient is now discharged from PT due to improvement in condition.    PT Home Exercise Plan Medbridge Access Code: [email or web address]    Consulted and Agree with Plan of Care Patient             Patient will benefit from skilled therapeutic intervention in order to improve the following deficits and impairments:  Improper body mechanics, Pain, Decreased mobility, Decreased activity tolerance, Decreased endurance, Decreased range of motion, Decreased strength, Impaired perceived functional ability, Hypomobility, Decreased balance, Difficulty walking  Visit Diagnosis: Chronic pain of right knee  Stiffness of right knee, not elsewhere classified  Difficulty in walking, not elsewhere classified  Muscle weakness (generalized)     Problem List Patient Active Problem List   Diagnosis Date Noted   COVID-19 12/31/2020   Preventative health care 01/11/2016   Insomnia 01/01/2014   HYPERCHOLESTEROLEMIA 10/04/2006   ENDOMETRIOSIS 10/04/2006    Nancy Nordmann, PT 01/14/2021, 3:58 PM  [facility] [address], [place], [address] Phone: [phone]   Fax:  [phone]  Name: Natalie Sosa MRN: [id number] Date of Birth: 09-19-1945

## 2021-01-20 ENCOUNTER — Other Ambulatory Visit: Payer: Self-pay | Admitting: Primary Care

## 2021-01-20 DIAGNOSIS — Z1231 Encounter for screening mammogram for malignant neoplasm of breast: Secondary | ICD-10-CM

## 2021-01-27 DIAGNOSIS — M9901 Segmental and somatic dysfunction of cervical region: Secondary | ICD-10-CM | POA: Diagnosis not present

## 2021-01-27 DIAGNOSIS — M9905 Segmental and somatic dysfunction of pelvic region: Secondary | ICD-10-CM | POA: Diagnosis not present

## 2021-01-27 DIAGNOSIS — M9903 Segmental and somatic dysfunction of lumbar region: Secondary | ICD-10-CM | POA: Diagnosis not present

## 2021-01-27 DIAGNOSIS — M5136 Other intervertebral disc degeneration, lumbar region: Secondary | ICD-10-CM | POA: Diagnosis not present

## 2021-02-03 ENCOUNTER — Encounter: Payer: Medicare Other | Admitting: Primary Care

## 2021-02-05 ENCOUNTER — Other Ambulatory Visit: Payer: Self-pay

## 2021-02-05 ENCOUNTER — Encounter: Payer: Self-pay | Admitting: Primary Care

## 2021-02-05 ENCOUNTER — Ambulatory Visit (INDEPENDENT_AMBULATORY_CARE_PROVIDER_SITE_OTHER): Payer: Medicare Other | Admitting: Primary Care

## 2021-02-05 VITALS — BP 120/64 | HR 82 | Temp 98.6°F | Ht 67.25 in | Wt 161.0 lb

## 2021-02-05 DIAGNOSIS — Z1211 Encounter for screening for malignant neoplasm of colon: Secondary | ICD-10-CM | POA: Diagnosis not present

## 2021-02-05 DIAGNOSIS — E7800 Pure hypercholesterolemia, unspecified: Secondary | ICD-10-CM

## 2021-02-05 DIAGNOSIS — Z0000 Encounter for general adult medical examination without abnormal findings: Secondary | ICD-10-CM

## 2021-02-05 DIAGNOSIS — E2839 Other primary ovarian failure: Secondary | ICD-10-CM

## 2021-02-05 DIAGNOSIS — G4700 Insomnia, unspecified: Secondary | ICD-10-CM

## 2021-02-05 LAB — COMPREHENSIVE METABOLIC PANEL
ALT: 16 U/L (ref 0–35)
AST: 22 U/L (ref 0–37)
Albumin: 4.4 g/dL (ref 3.5–5.2)
Alkaline Phosphatase: 58 U/L (ref 39–117)
BUN: 16 mg/dL (ref 6–23)
CO2: 29 mEq/L (ref 19–32)
Calcium: 9.7 mg/dL (ref 8.4–10.5)
Chloride: 103 mEq/L (ref 96–112)
Creatinine, Ser: 0.82 mg/dL (ref 0.40–1.20)
GFR: 69.99 mL/min (ref >60.00)
Glucose, Bld: 91 mg/dL (ref 70–99)
Potassium: 4.1 mEq/L (ref 3.5–5.1)
Sodium: 140 mEq/L (ref 135–145)
Total Bilirubin: 0.6 mg/dL (ref 0.2–1.2)
Total Protein: 6.9 g/dL (ref 6.0–8.3)

## 2021-02-05 LAB — LIPID PANEL
Cholesterol: 173 mg/dL (ref 0–200)
HDL: 78.1 mg/dL (ref >39.00)
LDL Cholesterol: 80 mg/dL (ref 0–99)
NonHDL: 95.21
Total CHOL/HDL Ratio: 2
Triglycerides: 77 mg/dL (ref 0.0–149.0)
VLDL: 15.4 mg/dL (ref 0.0–40.0)

## 2021-02-05 LAB — CBC
HCT: 40.6 % (ref 36.0–46.0)
Hemoglobin: 13.5 g/dL (ref 12.0–15.0)
MCHC: 33.3 g/dL (ref 30.0–36.0)
MCV: 91.6 fl (ref 78.0–100.0)
Platelets: 270 10*3/uL (ref 150.0–400.0)
RBC: 4.44 Mil/uL (ref 3.87–5.11)
RDW: 12.5 % (ref 11.5–15.5)
WBC: 4.9 10*3/uL (ref 4.0–10.5)

## 2021-02-05 NOTE — Progress Notes (Signed)
Subjective:   Natalie Sosa is a 76 y.o. female who presents for Medicare Annual (Subsequent) preventive examination.  I connected with Loren Racer today by telephone and verified that I am speaking with the correct person using two identifiers. Location patient: home Location provider: work Persons participating in the virtual visit: patient, Marine scientist.    I discussed the limitations, risks, security and privacy concerns of performing an evaluation and management service by telephone and the availability of in person appointments. I also discussed with the patient that there may be a patient responsible charge related to this service. The patient expressed understanding and verbally consented to this telephonic visit.    Interactive audio and video telecommunications were attempted between this provider and patient, however failed, due to patient having technical difficulties OR patient did not have access to video capability.  We continued and completed visit with audio only.  Some vital signs may be absent or patient reported.   Time Spent with patient on telephone encounter: 20 minutes  Review of Systems     Cardiac Risk Factors include: advanced age (>36mn, >>33women);dyslipidemia     Objective:    Today's Vitals   02/08/21 1444  Weight: 161 lb (73 kg)  Height: _0  (1.702 m)   Body mass index is 25.22 kg/m.  Advanced Directives 02/08/2021 12/16/2020 02/07/2020 01/28/2019 01/19/2018 01/13/2017 01/07/2016  Does Patient Have a Medical Advance Directive? _1  Yes Yes  Type of AParamedicof ABetsy LayneLiving will - HFort GreelyLiving will HSigourneyLiving will HTitonkaLiving will Living will;Healthcare Power of ACameronLiving will  Does patient want to make changes to medical advance directive? Yes (MAU/Ambulatory/Procedural Areas - Information given) No - Patient  declined - - - - -  Copy of [place] Chart? Yes - validated most recent copy scanned in chart (See row information) - Yes - validated most recent copy scanned in chart (See row information) Yes - validated most recent copy scanned in chart (See row information) No - copy requested No - copy requested No - copy requested    Current Medications (verified) Outpatient Encounter Medications as of 02/08/2021  Medication Sig   CALCIUM-VITAMIN D PO Take 2 tablets by mouth daily.   ibuprofen (ADVIL,MOTRIN) 200 MG tablet Use as directed   Multiple Vitamin (MULTIVITAMIN) tablet Take 1 tablet by mouth daily.   simvastatin (ZOCOR) 20 MG tablet TAKE 1 TABLET(20 MG) BY MOUTH AT BEDTIME for cholesterol.   No facility-administered encounter medications on file as of 02/08/2021.    Allergies (verified) Ezetimibe-simvastatin, Hydrocodone, and Prednisone   History: Past Medical History:  Diagnosis Date   [place]   COVID-19 12/31/2020   ENDOMETRIOSIS 10/04/2006   Qualifier: History of  By: SCouncil MechanicMD, RHilaria Ota   Past Surgical History:  Procedure Laterality Date   ACHILLES TENDON REPAIR  06/14/2004   CESAREAN SECTION     placenta previa   CHOLECYSTECTOMY  06/13/2001   Family History  Problem Relation Age of Onset   Heart disease Mother    Dementia Mother    Cancer Father        Pancreatic   Stroke Father    Breast cancer Paternal Grandmother [phone]  Breast cancer Paternal Aunt    Social History   Socioeconomic History   Marital status: Married    Spouse name: Not on file  Number of children: 1   Years of education: Not on file   Highest education level: Not on file  Occupational History   Not on file  Tobacco Use   Smoking status: Never   Smokeless tobacco: Never  Vaping Use   Vaping Use: Never used  Substance and Sexual Activity   Alcohol use: Yes    Comment: glass of wine occ   Drug use: Never   Sexual activity: Yes  Other Topics Concern    Not on file  Social History Narrative   Husband Gershon Mussel, aware of her end of life wishes.  She would desire CPR, no feedings tubes.   Social Determinants of Health   Financial Resource Strain: Low Risk    Difficulty of Paying Living Expenses: Not hard at all  Food Insecurity: No Food Insecurity   Worried About Charity fundraiser in the Last Year: Never true   [place] in the Last Year: Never true  Transportation Needs: No Transportation Needs   Lack of Transportation (Medical): No   Lack of Transportation (Non-Medical): No  Physical Activity: Sufficiently Active   Days of Exercise per Week: 3 days   Minutes of Exercise per Session: 120 min  Stress: No Stress Concern Present   Feeling of Stress : Not at all  Social Connections: Moderately Integrated   Frequency of Communication with Friends and Family: More than three times a week   Frequency of Social Gatherings with Friends and Family: More than three times a week   Attends Religious Services: Never   Marine scientist or Organizations: Yes   Attends Music therapist: More than 4 times per year   Marital Status: Married    Tobacco Counseling Counseling given: Not Answered   Clinical Intake:  Pre-visit preparation completed: Yes  Pain : No/denies pain     BMI - recorded: 25.22 Nutritional Status: BMI 25 -29 Overweight Nutritional Risks: None Diabetes: No  How often do you need to have someone help you when you read instructions, pamphlets, or other written materials from your doctor or pharmacy?: 1 - Never  Diabetic? No  Interpreter Needed?: No  Information entered by :: Orrin Brigham LPN   Activities of Daily Living In your present state of health, do you have any difficulty performing the following activities: 02/08/2021 02/05/2021  Hearing? N N  Vision? Y N  Difficulty concentrating or making decisions? N N  Walking or climbing stairs? N N  Dressing or bathing? N N  Doing errands,  shopping? N N  Preparing Food and eating ? N -  Using the Toilet? N -  In the past six months, have you accidently leaked urine? N -  Do you have problems with loss of bowel control? N -  Managing your Medications? N -  Managing your Finances? N -  Housekeeping or managing your Housekeeping? N -  Some recent data might be hidden    Patient Care Team: Pleas Koch, NP as PCP - General (Internal Medicine) Regal, Tamala Fothergill, DPM as Consulting Physician (Podiatry) Leandrew Koyanagi, MD as Referring Physician (Ophthalmology) Oneta Rack, MD as Consulting Physician (Dermatology) Mardene Celeste., MD as Consulting Physician (Dentistry)  Indicate any recent Medical Services you may have received from other than [facility] providers in the past year (date may be approximate).     Assessment:   This is a routine wellness examination for [place].  Hearing/Vision screen Hearing Screening - Comments:: No issues  Vision Screening - Comments:: Last exam 08/2020, Dr. Wallace Going , wears glasses  Dietary issues and exercise activities discussed: Current Exercise Habits: Home exercise routine, Type of exercise: Other - see comments (plays tennis), Time (Minutes): 60 (1 to 1 1/2hrs), Frequency (Times/Week): 3, Weekly Exercise (Minutes/Week): 180, Intensity: Moderate   Goals Addressed             This Visit's Progress    Patient Stated       Would like to maintain current routine       Depression Screen PHQ 2/9 Scores 02/08/2021 02/05/2021 02/07/2020 02/03/2020 01/28/2019 01/19/2018 01/13/2017  PHQ - 2 Score 0 0 0 2 0 0 0  PHQ- 9 Score - 0 0 3 0 0 0    Fall Risk Fall Risk  02/08/2021 02/05/2021 02/07/2020 02/03/2020 01/28/2019  Falls in the past year? 0 0 0 0 0  Number falls in past yr: 0 0 0 - 0  Injury with Fall? 0 0 0 0 0  Risk for fall due to : No Fall Risks - No Fall Risks - -  Follow up Falls prevention discussed - Falls evaluation completed;Falls prevention discussed - Falls  evaluation completed;Falls prevention discussed    FALL RISK PREVENTION PERTAINING TO THE HOME:  Any stairs in or around the home? No  If so, are there any without handrails? No  Home free of loose throw rugs in walkways, pet beds, electrical cords, etc? Yes  Adequate lighting in your home to reduce risk of falls? Yes   ASSISTIVE DEVICES UTILIZED TO PREVENT FALLS:  Life alert? No  Use of a cane, walker or w/c? No  Grab bars in the bathroom? Yes  Shower chair or bench in shower? No  Elevated toilet seat or a handicapped toilet? Yes   TIMED UP AND GO:  Was the test performed? No .    Cognitive Function: Normal cognitive status assessed by  this Nurse Health Advisor. No abnormalities found.   MMSE - Mini Mental State Exam 02/07/2020 01/28/2019 01/19/2018 01/13/2017 01/07/2016  Orientation to time _0 Orientation to Place _1 Registration _2 Attention/ Calculation 5 5 0 0 0  Recall _3 Language- name 2 objects - - 0 0 0  Language- repeat _4 Language- follow 3 step command - - _5 Language- read & follow direction - - 0 0 0  Write a sentence - - 0 0 0  Copy design - - 0 0 0  Total score - - _6 Immunizations Immunization History  Administered Date(s) Administered   Fluad Quad(high Dose 65+) 10/25/2018, 10/08/2019   Influenza Split 12/29/2010   Influenza Whole 11/05/2007, 11/09/2009   Influenza, High Dose Seasonal PF 10/02/2016   Influenza,inj,Quad PF,6+ Mos 10/04/2017   Influenza-Unspecified 10/24/2012, 10/07/2013, 10/21/2015, 10/12/2020   PFIZER(Purple Top)SARS-COV-2 Vaccination 02/16/2019, 03/09/2019, 10/24/2019, 04/23/2020, 10/12/2020   Pneumococcal Conjugate-13 01/01/2014   Pneumococcal Polysaccharide-23 11/11/2010   Td 10/13/2005   Zoster Recombinat (Shingrix) 11/29/2018, 02/04/2019   Zoster, Live 03/20/2007    TDAP status: Due, Education has been provided regarding the importance of this vaccine. Advised may  receive this vaccine at local pharmacy or Health Dept. Aware to provide a copy of the vaccination record if obtained from local pharmacy or Health Dept. Verbalized acceptance and understanding.  Flu Vaccine status: Up to  date  Pneumococcal vaccine status: Up to date  Covid-19 vaccine status: Information provided on how to obtain vaccines.   Qualifies for Shingles Vaccine? Yes   Zostavax completed Yes   Shingrix Completed?: Yes  Screening Tests Health Maintenance  Topic Date Due   COLON CANCER SCREENING ANNUAL FOBT  02/03/2021   TETANUS/TDAP  10/12/2025 (Originally 10/14/2015)   COLONOSCOPY (Pts 45-46yr Insurance coverage will need to be confirmed)  01/06/2026 (Originally 09/18/1990)   MAMMOGRAM  03/05/2021   Pneumonia Vaccine 76 Years old  Completed   INFLUENZA VACCINE  Completed   DEXA SCAN  Completed   COVID-19 Vaccine  Completed   Hepatitis C Screening  Completed   Zoster Vaccines- Shingrix  Completed   HPV VACCINES  Aged Out    Health Maintenance  Health Maintenance Due  Topic Date Due   COLON CANCER SCREENING ANNUAL FOBT  02/03/2021    Colorectal cancer screening: Type of screening: FOBT/FIT. Completed 02/04/20. Repeat every 1 years Patient has FOBT testing kit Mammogram status: Completed 03/05/20. Repeat every year  Bone Density status: Completed 02/28/19. Results reflect: Bone density results: OSTEOPENIA. Repeat every 2 years.  Lung Cancer Screening: (Low Dose CT Chest recommended if Age 76-80years, 30 pack-year currently smoking OR have quit w/in 15years.) does not qualify.     Additional Screening:  Hepatitis C Screening: does qualify; Completed 01/07/16  Vision Screening: Recommended annual ophthalmology exams for early detection of glaucoma and other disorders of the eye. Is the patient up to date with their annual eye exam?  Yes  Who is the provider or what is the name of the office in which the patient attends annual eye exams? Dr. BWallace Going  Dental  Screening: Recommended annual dental exams for proper oral hygiene  Community Resource Referral / Chronic Care Management: CRR required this visit?  No   CCM required this visit?  No      Plan:     I have personally reviewed and noted the following in the patients chart:   Medical and social history Use of alcohol, tobacco or illicit drugs  Current medications and supplements including opioid prescriptions.  Functional ability and status Nutritional status Physical activity Advanced directives List of other physicians Hospitalizations, surgeries, and ER visits in previous 12 months Vitals Screenings to include cognitive, depression, and falls Referrals and appointments  In addition, I have reviewed and discussed with patient certain preventive protocols, quality metrics, and best practice recommendations. A written personalized care plan for preventive services as well as general preventive health recommendations were provided to patient.    Due to this being a telephonic visit, the after visit summary with patients personalized plan was offered to patient via mail or my-chart.  Patient would like to access on my-chart.     TLoma Messing LPN  19/38/1829  Nurse Health Advisor  Nurse Notes: none

## 2021-02-05 NOTE — Assessment & Plan Note (Signed)
Immunizations UTD. Mammogram due and is scheduled. Bone density scan due, orders placed. Occult stool card due and pending.  Commended her on a healthy lifestyle. Encouraged to continue.  Exam today stable. Labs pending.

## 2021-02-05 NOTE — Patient Instructions (Signed)
Stop by the lab prior to leaving today. I will notify you of your results once received.  ° °[facility] to schedule your bone density scan. ° °It was a pleasure to see you today! ° °Preventive Care 65 Years and Older, Female °Preventive care refers to lifestyle choices and visits with your health care provider that can promote health and wellness. Preventive care visits are also called wellness exams. °What can I expect for my preventive care visit? °Counseling °Your health care provider may ask you questions about your: °Medical history, including: °Past medical problems. °Family medical history. °Pregnancy and menstrual history. °History of falls. °Current health, including: °Memory and ability to understand (cognition). °Emotional well-being. °Home life and relationship well-being. °Sexual activity and sexual health. °Lifestyle, including: °Alcohol, nicotine or tobacco, and drug use. °Access to firearms. °Diet, exercise, and sleep habits. °Work and work environment. °Sunscreen use. °Safety issues such as seatbelt and bike helmet use. °Physical exam °Your health care provider will check your: °Height and weight. These may be used to calculate your BMI (body mass index). BMI is a measurement that tells if you are at a healthy weight. °Waist circumference. This measures the distance around your waistline. This measurement also tells if you are at a healthy weight and may help predict your risk of certain diseases, such as type 2 diabetes and high blood pressure. °Heart rate and blood pressure. °Body temperature. °Skin for abnormal spots. °What immunizations do I need? °Vaccines are usually given at various ages, according to a schedule. Your health care provider will recommend vaccines for you based on your age, medical history, and lifestyle or other factors, such as travel or where you work. °What tests do I need? °Screening °Your health care provider may recommend screening tests for certain  conditions. This may include: °Lipid and cholesterol levels. °Hepatitis C test. °Hepatitis B test. °HIV (human immunodeficiency virus) test. °STI (sexually transmitted infection) testing, if you are at risk. °Lung cancer screening. °Colorectal cancer screening. °Diabetes screening. This is done by checking your blood sugar (glucose) after you have not eaten for a while (fasting). °Mammogram. Talk with your health care provider about how often you should have regular mammograms. °BRCA-related cancer screening. This may be done if you have a family history of breast, ovarian, tubal, or peritoneal cancers. °Bone density scan. This is done to screen for osteoporosis. °Talk with your health care provider about your test results, treatment options, and if necessary, the need for more tests. °Follow these instructions at home: °Eating and drinking ° °Eat a diet that includes fresh fruits and vegetables, whole grains, lean protein, and low-fat dairy products. Limit your intake of foods with high amounts of sugar, saturated fats, and salt. °Take vitamin and mineral supplements as recommended by your health care provider. °Do not drink alcohol if your health care provider tells you not to drink. °If you drink alcohol: °Limit how much you have to 0-1 drink a day. °Know how much alcohol is in your drink. In the [place] one drink equals one 12 oz bottle of beer (355 mL), one 5 oz glass of wine (148 mL), or one 1½ oz glass of hard liquor (44 mL). °Lifestyle °Brush your teeth every morning and night with fluoride toothpaste. Floss one time each day. °Exercise for at least 30 minutes 5 or more days each week. °Do not use any products that contain nicotine or tobacco. These products include cigarettes, chewing tobacco, and vaping devices, such as e-cigarettes. If you need   need help quitting, ask your health care provider. Do not use drugs. If you are sexually active, practice safe sex. Use a condom or other form of protection in order to  prevent STIs. Take aspirin only as told by your health care provider. Make sure that you understand how much to take and what form to take. Work with your health care provider to find out whether it is safe and beneficial for you to take aspirin daily. Ask your health care provider if you need to take a cholesterol-lowering medicine (statin). Find healthy ways to manage stress, such as: Meditation, yoga, or listening to music. Journaling. Talking to a trusted person. Spending time with friends and family. Minimize exposure to UV radiation to reduce your risk of skin cancer. Safety Always wear your seat belt while driving or riding in a vehicle. Do not drive: If you have been drinking alcohol. Do not ride with someone who has been drinking. When you are tired or distracted. While texting. If you have been using any mind-altering substances or drugs. Wear a helmet and other protective equipment during sports activities. If you have firearms in your house, make sure you follow all gun safety procedures. What's next? Visit your health care provider once a year for an annual wellness visit. Ask your health care provider how often you should have your eyes and teeth checked. Stay up to date on all vaccines. This information is not intended to replace advice given to you by your health care provider. Make sure you discuss any questions you have with your health care provider. Document Revised: 07/08/2020 Document Reviewed: 07/08/2020 Elsevier Patient Education  [place].

## 2021-02-05 NOTE — Assessment & Plan Note (Signed)
No concerns today, continue to monitor.

## 2021-02-05 NOTE — Assessment & Plan Note (Signed)
Compliant to simvastatin 20 mg, continue same. Repeat lipid panel pending.

## 2021-02-05 NOTE — Progress Notes (Signed)
Subjective:    Patient ID: Natalie Sosa, female    DOB: [birth year], 76 y.o.   MRN: [id number]  HPI  Natalie Sosa is a very pleasant 76 y.o. female who presents today for complete physical and follow up of chronic conditions.  Immunizations: -Tetanus: 2007 -Influenza: Completed this season  -Covid-19: 5 vaccines  -Shingles: Completed Shingrix -Pneumonia: Prevnar 13 2015, Pneumovax 2012   Diet: [place].  Exercise: Plays tennis 3-4 times weekly  Eye exam: Completes annually  Dental exam: Completes semi-annually   Mammogram: Completed in 2022, scheduled for February 2023 Dexa: Completed in Feb 2021 Colonoscopy: Typically completes Fecal occult cards.   BP Readings from Last 3 Encounters:  02/05/21 120/64  12/31/20 126/66  09/14/20 110/60         Review of Systems  Constitutional:  Negative for unexpected weight change.  HENT:  Negative for rhinorrhea.   Respiratory:  Negative for cough and shortness of breath.   Cardiovascular:  Negative for chest pain.  Gastrointestinal:  Negative for constipation and diarrhea.  Genitourinary:  Negative for difficulty urinating.  Musculoskeletal:  Negative for arthralgias and myalgias.  Skin:  Negative for rash.  Allergic/Immunologic: Negative for environmental allergies.  Neurological:  Negative for dizziness and headaches.  Psychiatric/Behavioral:  The patient is not nervous/anxious.         Past Medical History:  Diagnosis Date   Cancer [facility])    skin   COVID-19 12/31/2020   ENDOMETRIOSIS 10/04/2006   Qualifier: History of  By: Council Mechanic MD, Hilaria Ota     Social History   Socioeconomic History   Marital status: Married    Spouse name: Not on file   Number of children: 1   Years of education: Not on file   Highest education level: Not on file  Occupational History   Not on file  Tobacco Use   Smoking status: Never   Smokeless tobacco: Never  Vaping Use   Vaping Use: Never used  Substance and Sexual  Activity   Alcohol use: Yes    Comment: glass of wine occ   Drug use: Never   Sexual activity: Yes  Other Topics Concern   Not on file  Social History Narrative   Husband Gershon Mussel, aware of her end of life wishes.  She would desire CPR, no feedings tubes.   Social Determinants of Health   Financial Resource Strain: Low Risk    Difficulty of Paying Living Expenses: Not hard at all  Food Insecurity: No Food Insecurity   Worried About Charity fundraiser in the Last Year: Never true   [place] in the Last Year: Never true  Transportation Needs: No Transportation Needs   Lack of Transportation (Medical): No   Lack of Transportation (Non-Medical): No  Physical Activity: Sufficiently Active   Days of Exercise per Week: 3 days   Minutes of Exercise per Session: 120 min  Stress: No Stress Concern Present   Feeling of Stress : Not at all  Social Connections: Not on file  Intimate Partner Violence: Not At Risk   Fear of Current or Ex-Partner: No   Emotionally Abused: No   Physically Abused: No   Sexually Abused: No    Past Surgical History:  Procedure Laterality Date   ACHILLES TENDON REPAIR  06/14/2004   CESAREAN SECTION     placenta previa   CHOLECYSTECTOMY  06/13/2001    Family History  Problem Relation Age of Onset   Heart disease Mother  Dementia Mother    Cancer Father        Pancreatic   Stroke Father    Breast cancer Paternal Grandmother 3   Breast cancer Paternal Aunt     Allergies  Allergen Reactions   Ezetimibe-Simvastatin     REACTION: Achey   Hydrocodone     REACTION: Itchy, rash   Prednisone     REACTION: anxiety, hyper    Current Outpatient Medications on File Prior to Visit  Medication Sig Dispense Refill   CALCIUM-VITAMIN D PO Take 2 tablets by mouth daily.     ibuprofen (ADVIL,MOTRIN) 200 MG tablet Use as directed     Multiple Vitamin (MULTIVITAMIN) tablet Take 1 tablet by mouth daily.     simvastatin (ZOCOR) 20 MG tablet TAKE 1  TABLET(20 MG) BY MOUTH AT BEDTIME for cholesterol. 90 tablet 3   No current facility-administered medications on file prior to visit.    BP 120/64    Pulse 82    Temp 98.6 F (37 C) (Temporal)    Ht 5' 7.25" (1.708 m)    Wt 161 lb (73 kg)    SpO2 96%    BMI 25.03 kg/m  Objective:   Physical Exam HENT:     Right Ear: Tympanic membrane and ear canal normal.     Left Ear: Tympanic membrane and ear canal normal.     Nose: Nose normal.  Eyes:     Conjunctiva/sclera: Conjunctivae normal.     Pupils: Pupils are equal, round, and reactive to light.  Neck:     Thyroid: No thyromegaly.  Cardiovascular:     Rate and Rhythm: Normal rate and regular rhythm.     Heart sounds: No murmur heard. Pulmonary:     Effort: Pulmonary effort is normal.     Breath sounds: Normal breath sounds. No rales.  Abdominal:     General: Bowel sounds are normal.     Palpations: Abdomen is soft.     Tenderness: There is no abdominal tenderness.  Musculoskeletal:        General: Normal range of motion.     Cervical back: Neck supple.  Lymphadenopathy:     Cervical: No cervical adenopathy.  Skin:    General: Skin is warm and dry.     Findings: No rash.  Neurological:     Mental Status: She is alert and oriented to person, place, and time.     Cranial Nerves: No cranial nerve deficit.     Deep Tendon Reflexes: Reflexes are normal and symmetric.  Psychiatric:        Mood and Affect: Mood normal.          Assessment & Plan:      This visit occurred during the SARS-CoV-2 public health emergency.  Safety protocols were in place, including screening questions prior to the visit, additional usage of staff PPE, and extensive cleaning of exam room while observing appropriate contact time as indicated for disinfecting solutions.

## 2021-02-08 ENCOUNTER — Ambulatory Visit (INDEPENDENT_AMBULATORY_CARE_PROVIDER_SITE_OTHER): Payer: Medicare Other

## 2021-02-08 VITALS — Ht 67.0 in | Wt 161.0 lb

## 2021-02-08 DIAGNOSIS — Z0000 Encounter for general adult medical examination without abnormal findings: Secondary | ICD-10-CM

## 2021-02-08 NOTE — Patient Instructions (Addendum)
[name] , Thank you for taking time to complete your Medicare Wellness Visit. I appreciate your ongoing commitment to your health goals. Please review the following plan we discussed and let me know if I can assist you in the future.   Screening recommendations/referrals: Colonoscopy: due FOBT last completed 02/04/20, per our conversation you have the testing kit Mammogram: up to date, completed 03/05/20, appointment scheduled for 03/08/21 Bone Density: up to date, completed 02/28/19, ordered placed for your next appointment  Recommended yearly ophthalmology/optometry visit for glaucoma screening and checkup Recommended yearly dental visit for hygiene and checkup  Vaccinations: Influenza vaccine: up to date Pneumococcal vaccine: up to date Tdap vaccine: Due last completed 10/13/05-May obtain vaccine at your local pharmacy. Shingles vaccine: up to date   Covid-19:newest booster available at your local pharmacy  Advanced directives: copy on file   Conditions/risks identified: see problem list  Next appointment: Follow up in one year for your annual wellness visit 02/09/22 @ 2:45pm, this will be a telephone visit   Preventive Care 38 Years and Older, Female Preventive care refers to lifestyle choices and visits with your health care provider that can promote health and wellness. What does preventive care include? A yearly physical exam. This is also called an annual well check. Dental exams once or twice a year. Routine eye exams. Ask your health care provider how often you should have your eyes checked. Personal lifestyle choices, including: Daily care of your teeth and gums. Regular physical activity. Eating a healthy diet. Avoiding tobacco and drug use. Limiting alcohol use. Practicing safe sex. Taking low-dose aspirin every day. Taking vitamin and mineral supplements as recommended by your health care provider. What happens during an annual well check? The services and screenings  done by your health care provider during your annual well check will depend on your age, overall health, lifestyle risk factors, and family history of disease. Counseling  Your health care provider may ask you questions about your: Alcohol use. Tobacco use. Drug use. Emotional well-being. Home and relationship well-being. Sexual activity. Eating habits. History of falls. Memory and ability to understand (cognition). Work and work Statistician. Reproductive health. Screening  You may have the following tests or measurements: Height, weight, and BMI. Blood pressure. Lipid and cholesterol levels. These may be checked every 5 years, or more frequently if you are over 69 years old. Skin check. Lung cancer screening. You may have this screening every year starting at age 46 if you have a 30-pack-year history of smoking and currently smoke or have quit within the past 15 years. Fecal occult blood test (FOBT) of the stool. You may have this test every year starting at age 41. Flexible sigmoidoscopy or colonoscopy. You may have a sigmoidoscopy every 5 years or a colonoscopy every 10 years starting at age 63. Hepatitis C blood test. Hepatitis B blood test. Sexually transmitted disease (STD) testing. Diabetes screening. This is done by checking your blood sugar (glucose) after you have not eaten for a while (fasting). You may have this done every 1-3 years. Bone density scan. This is done to screen for osteoporosis. You may have this done starting at age 59. Mammogram. This may be done every 1-2 years. Talk to your health care provider about how often you should have regular mammograms. Talk with your health care provider about your test results, treatment options, and if necessary, the need for more tests. Vaccines  Your health care provider may recommend certain vaccines, such as: Influenza vaccine. This is  recommended every year. Tetanus, diphtheria, and acellular pertussis (Tdap, Td)  vaccine. You may need a Td booster every 10 years. Zoster vaccine. You may need this after age 52. Pneumococcal 13-valent conjugate (PCV13) vaccine. One dose is recommended after age 96. Pneumococcal polysaccharide (PPSV23) vaccine. One dose is recommended after age 30. Talk to your health care provider about which screenings and vaccines you need and how often you need them. This information is not intended to replace advice given to you by your health care provider. Make sure you discuss any questions you have with your health care provider. Document Released: 02/06/2015 Document Revised: 09/30/2015 Document Reviewed: 11/11/2014 Elsevier Interactive Patient Education  2017 [place] Prevention in the Home Falls can cause injuries. They can happen to people of all ages. There are many things you can do to make your home safe and to help prevent falls. What can I do on the outside of my home? Regularly fix the edges of walkways and driveways and fix any cracks. Remove anything that might make you trip as you walk through a door, such as a raised step or threshold. Trim any bushes or trees on the path to your home. Use bright outdoor lighting. Clear any walking paths of anything that might make someone trip, such as rocks or tools. Regularly check to see if handrails are loose or broken. Make sure that both sides of any steps have handrails. Any raised decks and porches should have guardrails on the edges. Have any leaves, snow, or ice cleared regularly. Use sand or salt on walking paths during winter. Clean up any spills in your garage right away. This includes oil or grease spills. What can I do in the bathroom? Use night lights. Install grab bars by the toilet and in the tub and shower. Do not use towel bars as grab bars. Use non-skid mats or decals in the tub or shower. If you need to sit down in the shower, use a plastic, non-slip stool. Keep the floor dry. Clean up any  water that spills on the floor as soon as it happens. Remove soap buildup in the tub or shower regularly. Attach bath mats securely with double-sided non-slip rug tape. Do not have throw rugs and other things on the floor that can make you trip. What can I do in the bedroom? Use night lights. Make sure that you have a light by your bed that is easy to reach. Do not use any sheets or blankets that are too big for your bed. They should not hang down onto the floor. Have a firm chair that has side arms. You can use this for support while you get dressed. Do not have throw rugs and other things on the floor that can make you trip. What can I do in the kitchen? Clean up any spills right away. Avoid walking on wet floors. Keep items that you use a lot in easy-to-reach places. If you need to reach something above you, use a strong step stool that has a grab bar. Keep electrical cords out of the way. Do not use floor polish or wax that makes floors slippery. If you must use wax, use non-skid floor wax. Do not have throw rugs and other things on the floor that can make you trip. What can I do with my stairs? Do not leave any items on the stairs. Make sure that there are handrails on both sides of the stairs and use them. Fix handrails that are  broken or loose. Make sure that handrails are as long as the stairways. Check any carpeting to make sure that it is firmly attached to the stairs. Fix any carpet that is loose or worn. Avoid having throw rugs at the top or bottom of the stairs. If you do have throw rugs, attach them to the floor with carpet tape. Make sure that you have a light switch at the top of the stairs and the bottom of the stairs. If you do not have them, ask someone to add them for you. What else can I do to help prevent falls? Wear shoes that: Do not have high heels. Have rubber bottoms. Are comfortable and fit you well. Are closed at the toe. Do not wear sandals. If you use a  stepladder: Make sure that it is fully opened. Do not climb a closed stepladder. Make sure that both sides of the stepladder are locked into place. Ask someone to hold it for you, if possible. Clearly mark and make sure that you can see: Any grab bars or handrails. First and last steps. Where the edge of each step is. Use tools that help you move around (mobility aids) if they are needed. These include: Canes. Walkers. Scooters. Crutches. Turn on the lights when you go into a dark area. Replace any light bulbs as soon as they burn out. Set up your furniture so you have a clear path. Avoid moving your furniture around. If any of your floors are uneven, fix them. If there are any pets around you, be aware of where they are. Review your medicines with your doctor. Some medicines can make you feel dizzy. This can increase your chance of falling. Ask your doctor what other things that you can do to help prevent falls. This information is not intended to replace advice given to you by your health care provider. Make sure you discuss any questions you have with your health care provider. Document Released: 11/06/2008 Document Revised: 06/18/2015 Document Reviewed: 02/14/2014 Elsevier Interactive Patient Education  2017 [employer].

## 2021-02-12 ENCOUNTER — Other Ambulatory Visit: Payer: Medicare Other

## 2021-02-12 ENCOUNTER — Other Ambulatory Visit (INDEPENDENT_AMBULATORY_CARE_PROVIDER_SITE_OTHER): Payer: Medicare Other

## 2021-02-12 DIAGNOSIS — Z1211 Encounter for screening for malignant neoplasm of colon: Secondary | ICD-10-CM | POA: Diagnosis not present

## 2021-02-12 LAB — FECAL OCCULT BLOOD, IMMUNOCHEMICAL: Fecal Occult Bld: NEGATIVE

## 2021-02-24 DIAGNOSIS — M9905 Segmental and somatic dysfunction of pelvic region: Secondary | ICD-10-CM | POA: Diagnosis not present

## 2021-02-24 DIAGNOSIS — M9901 Segmental and somatic dysfunction of cervical region: Secondary | ICD-10-CM | POA: Diagnosis not present

## 2021-02-24 DIAGNOSIS — M9903 Segmental and somatic dysfunction of lumbar region: Secondary | ICD-10-CM | POA: Diagnosis not present

## 2021-02-24 DIAGNOSIS — M5136 Other intervertebral disc degeneration, lumbar region: Secondary | ICD-10-CM | POA: Diagnosis not present

## 2021-03-24 DIAGNOSIS — M9905 Segmental and somatic dysfunction of pelvic region: Secondary | ICD-10-CM | POA: Diagnosis not present

## 2021-03-24 DIAGNOSIS — M9901 Segmental and somatic dysfunction of cervical region: Secondary | ICD-10-CM | POA: Diagnosis not present

## 2021-03-24 DIAGNOSIS — M5136 Other intervertebral disc degeneration, lumbar region: Secondary | ICD-10-CM | POA: Diagnosis not present

## 2021-03-24 DIAGNOSIS — M9903 Segmental and somatic dysfunction of lumbar region: Secondary | ICD-10-CM | POA: Diagnosis not present

## 2021-03-25 DIAGNOSIS — H40003 Preglaucoma, unspecified, bilateral: Secondary | ICD-10-CM | POA: Diagnosis not present

## 2021-03-31 ENCOUNTER — Ambulatory Visit
Admission: RE | Admit: 2021-03-31 | Discharge: 2021-03-31 | Disposition: A | Discharge status: Home / Self Care | Payer: Medicare Other | Source: Ambulatory Visit | Attending: Primary Care | Admitting: Primary Care

## 2021-03-31 ENCOUNTER — Other Ambulatory Visit: Payer: Self-pay | Admitting: Primary Care

## 2021-03-31 ENCOUNTER — Other Ambulatory Visit: Payer: Self-pay

## 2021-03-31 DIAGNOSIS — Z780 Asymptomatic menopausal state: Secondary | ICD-10-CM | POA: Diagnosis not present

## 2021-03-31 DIAGNOSIS — Z1231 Encounter for screening mammogram for malignant neoplasm of breast: Secondary | ICD-10-CM

## 2021-03-31 DIAGNOSIS — E2839 Other primary ovarian failure: Secondary | ICD-10-CM

## 2021-03-31 DIAGNOSIS — M8589 Other specified disorders of bone density and structure, multiple sites: Secondary | ICD-10-CM | POA: Diagnosis not present

## 2021-03-31 DIAGNOSIS — E7800 Pure hypercholesterolemia, unspecified: Secondary | ICD-10-CM

## 2021-04-21 DIAGNOSIS — M9903 Segmental and somatic dysfunction of lumbar region: Secondary | ICD-10-CM | POA: Diagnosis not present

## 2021-04-21 DIAGNOSIS — M9905 Segmental and somatic dysfunction of pelvic region: Secondary | ICD-10-CM | POA: Diagnosis not present

## 2021-04-21 DIAGNOSIS — M9901 Segmental and somatic dysfunction of cervical region: Secondary | ICD-10-CM | POA: Diagnosis not present

## 2021-04-21 DIAGNOSIS — M5136 Other intervertebral disc degeneration, lumbar region: Secondary | ICD-10-CM | POA: Diagnosis not present

## 2021-05-19 DIAGNOSIS — M9901 Segmental and somatic dysfunction of cervical region: Secondary | ICD-10-CM | POA: Diagnosis not present

## 2021-05-19 DIAGNOSIS — M9903 Segmental and somatic dysfunction of lumbar region: Secondary | ICD-10-CM | POA: Diagnosis not present

## 2021-05-19 DIAGNOSIS — M5136 Other intervertebral disc degeneration, lumbar region: Secondary | ICD-10-CM | POA: Diagnosis not present

## 2021-05-19 DIAGNOSIS — M9905 Segmental and somatic dysfunction of pelvic region: Secondary | ICD-10-CM | POA: Diagnosis not present

## 2021-06-16 DIAGNOSIS — M9903 Segmental and somatic dysfunction of lumbar region: Secondary | ICD-10-CM | POA: Diagnosis not present

## 2021-06-16 DIAGNOSIS — M5136 Other intervertebral disc degeneration, lumbar region: Secondary | ICD-10-CM | POA: Diagnosis not present

## 2021-06-16 DIAGNOSIS — M9905 Segmental and somatic dysfunction of pelvic region: Secondary | ICD-10-CM | POA: Diagnosis not present

## 2021-06-16 DIAGNOSIS — M9901 Segmental and somatic dysfunction of cervical region: Secondary | ICD-10-CM | POA: Diagnosis not present

## 2021-07-14 DIAGNOSIS — M9903 Segmental and somatic dysfunction of lumbar region: Secondary | ICD-10-CM | POA: Diagnosis not present

## 2021-07-14 DIAGNOSIS — M9901 Segmental and somatic dysfunction of cervical region: Secondary | ICD-10-CM | POA: Diagnosis not present

## 2021-07-14 DIAGNOSIS — M5136 Other intervertebral disc degeneration, lumbar region: Secondary | ICD-10-CM | POA: Diagnosis not present

## 2021-07-14 DIAGNOSIS — M9905 Segmental and somatic dysfunction of pelvic region: Secondary | ICD-10-CM | POA: Diagnosis not present

## 2021-08-11 DIAGNOSIS — M9905 Segmental and somatic dysfunction of pelvic region: Secondary | ICD-10-CM | POA: Diagnosis not present

## 2021-08-11 DIAGNOSIS — M9901 Segmental and somatic dysfunction of cervical region: Secondary | ICD-10-CM | POA: Diagnosis not present

## 2021-08-11 DIAGNOSIS — M5136 Other intervertebral disc degeneration, lumbar region: Secondary | ICD-10-CM | POA: Diagnosis not present

## 2021-08-11 DIAGNOSIS — M9903 Segmental and somatic dysfunction of lumbar region: Secondary | ICD-10-CM | POA: Diagnosis not present

## 2021-08-31 ENCOUNTER — Telehealth: Payer: Self-pay

## 2021-08-31 NOTE — Telephone Encounter (Signed)
[place] Night - Client Nonclinical Telephone Record  [facility] Client Natalie Sosa [facility] Night - Client Client Site [place] - Night Provider Alma Friendly - NP Contact Type Call Who Is Calling Patient / Member / Family / Caregiver Caller Name Zyonna Vardaman Caller Phone Number [phone] Patient Name Natalie Sosa Patient DOB 01/16/1946 Call Type Message Only Information Provided Reason for Call Request to Schedule Office Appointment Initial Comment Caller states she may need a colonoscopy she needs an apt. Patient request to speak to RN No Additional Comment Office hours provided. Disp. Time Disposition Final User 08/31/2021 5:59:36 AM General Information Provided Yes Allene Pyo Call Closed By: Allene Pyo Transaction Date/Time: 08/31/2021 5:57:20 AM (ET   Sending note to [facility]sc support.

## 2021-09-01 ENCOUNTER — Ambulatory Visit (INDEPENDENT_AMBULATORY_CARE_PROVIDER_SITE_OTHER): Payer: Medicare Other | Admitting: Primary Care

## 2021-09-01 ENCOUNTER — Encounter: Payer: Self-pay | Admitting: Primary Care

## 2021-09-01 VITALS — BP 120/62 | HR 68 | Temp 98.6°F | Ht 67.0 in | Wt 162.0 lb

## 2021-09-01 DIAGNOSIS — R102 Pelvic and perineal pain: Secondary | ICD-10-CM | POA: Diagnosis not present

## 2021-09-01 DIAGNOSIS — K625 Hemorrhage of anus and rectum: Secondary | ICD-10-CM | POA: Diagnosis not present

## 2021-09-01 HISTORY — DX: Hemorrhage of anus and rectum: K62.5

## 2021-09-01 HISTORY — DX: Pelvic and perineal pain: R10.2

## 2021-09-01 LAB — POC URINALSYSI DIPSTICK (AUTOMATED)
Bilirubin, UA: NEGATIVE
Blood, UA: NEGATIVE
Glucose, UA: NEGATIVE
Leukocytes, UA: NEGATIVE
Nitrite, UA: NEGATIVE
Protein, UA: NEGATIVE
Spec Grav, UA: 1.015 (ref 1.010–1.025)
Urobilinogen, UA: 0.2 E.U./dL
pH, UA: 6 (ref 5.0–8.0)

## 2021-09-01 LAB — HEMOCCULT GUIAC POC 1CARD (OFFICE): Fecal Occult Blood, POC: NEGATIVE

## 2021-09-01 NOTE — Progress Notes (Signed)
Subjective:    Patient ID: Natalie Sosa, female    DOB: [birth year], 76 y.o.   MRN: [id number]  HPI  Natalie Sosa is a very pleasant 76 y.o. female with a history of hyperlipidemia, endometriosis,  insomnia who presents today to discuss rectal bleeding.   Symptom onset 2-3 weeks ago with suprapubic pressure for which she noticed intermittently with sitting only. Mostly notices after dinner when sitting. One week ago she noticed a small amount of bright red rectal bleeding on the toilet paper with a bowel movement. Yesterday she noticed a small amount of bright red bleeding on the toilet paper with a bowel movement. She thinks she may have been slightly straining but doesn't recall constipation recently. Her pressure is not relieved with a bowel movement, only with standing or laying down.   She denies dizziness, fevers, nausea, vomiting, rectal discomfort, family history of colon cancer or bowel disorders, dysuria, urinary frequency, vaginal bleeding. She plays tennis three times weekly and she denies suprapubic pressure when playing.   She's never completed a colonoscopy, typically completes stool cards annually which are historically negative.    Review of Systems  Constitutional:  Negative for fever and unexpected weight change.  Respiratory:  Negative for shortness of breath.   Gastrointestinal:  Positive for blood in stool. Negative for constipation, diarrhea, nausea and vomiting.  Genitourinary:  Positive for pelvic pain. Negative for dysuria, frequency, vaginal bleeding and vaginal discharge.  Neurological:  Negative for dizziness.         Past Medical History:  Diagnosis Date   Cancer [facility])    skin   COVID-19 12/31/2020   ENDOMETRIOSIS 10/04/2006   Qualifier: History of  By: Council Mechanic MD, Hilaria Ota     Social History   Socioeconomic History   Marital status: Married    Spouse name: Not on file   Number of children: 1   Years of education: Not on file   Highest  education level: Not on file  Occupational History   Not on file  Tobacco Use   Smoking status: Never   Smokeless tobacco: Never  Vaping Use   Vaping Use: Never used  Substance and Sexual Activity   Alcohol use: Yes    Comment: glass of wine occ   Drug use: Never   Sexual activity: Yes  Other Topics Concern   Not on file  Social History Narrative   Husband Gershon Mussel, aware of her end of life wishes.  She would desire CPR, no feedings tubes.   Social Determinants of Health   Financial Resource Strain: Low Risk  (02/08/2021)   Overall Financial Resource Strain (CARDIA)    Difficulty of Paying Living Expenses: Not hard at all  Food Insecurity: No Food Insecurity (02/08/2021)   Hunger Vital Sign    Worried About Running Out of Food in the Last Year: Never true    Ran Out of Food in the Last Year: Never true  Transportation Needs: No Transportation Needs (02/08/2021)   PRAPARE - Hydrologist (Medical): No    Lack of Transportation (Non-Medical): No  Physical Activity: Sufficiently Active (02/08/2021)   Exercise Vital Sign    Days of Exercise per Week: 3 days    Minutes of Exercise per Session: 120 min  Stress: No Stress Concern Present (02/08/2021)   [place]    Feeling of Stress : Not at all  Social Connections: Moderately Integrated (02/08/2021)  Social Licensed conveyancer [NHANES]    Frequency of Communication with Friends and Family: More than three times a week    Frequency of Social Gatherings with Friends and Family: More than three times a week    Attends Religious Services: Never    Marine scientist or Organizations: Yes    Attends Music therapist: More than 4 times per year    Marital Status: Married  Human resources officer Violence: Not At Risk (02/08/2021)   Humiliation, Afraid, Rape, and Kick questionnaire    Fear of Current or Ex-Partner: No     Emotionally Abused: No    Physically Abused: No    Sexually Abused: No    Past Surgical History:  Procedure Laterality Date   ACHILLES TENDON REPAIR  06/14/2004   CESAREAN SECTION     placenta previa   CHOLECYSTECTOMY  06/13/2001    Family History  Problem Relation Age of Onset   Heart disease Mother    Dementia Mother    Cancer Father        Pancreatic   Stroke Father    Breast cancer Paternal Grandmother 73   Breast cancer Paternal Aunt     Allergies  Allergen Reactions   Ezetimibe-Simvastatin     REACTION: Achey   Hydrocodone     REACTION: Itchy, rash   Prednisone     REACTION: anxiety, hyper    Current Outpatient Medications on File Prior to Visit  Medication Sig Dispense Refill   CALCIUM-VITAMIN D PO Take 2 tablets by mouth daily.     ibuprofen (ADVIL,MOTRIN) 200 MG tablet Use as directed     Multiple Vitamin (MULTIVITAMIN) tablet Take 1 tablet by mouth daily.     simvastatin (ZOCOR) 20 MG tablet TAKE 1 TABLET(20 MG) BY MOUTH AT BEDTIME FOR CHOLESTEROL 90 tablet 2   No current facility-administered medications on file prior to visit.    BP 120/62   Pulse 68   Temp 98.6 F (37 C) (Oral)   Ht '5\' 7"'$  (1.702 m)   Wt 162 lb (73.5 kg)   SpO2 97%   BMI 25.37 kg/m  Objective:   Physical Exam Cardiovascular:     Rate and Rhythm: Normal rate and regular rhythm.  Pulmonary:     Effort: Pulmonary effort is normal.     Breath sounds: Normal breath sounds.  Abdominal:     General: Bowel sounds are normal.     Palpations: Abdomen is soft.     Tenderness: There is no abdominal tenderness.  Genitourinary:    Rectum: Guaiac result negative. No tenderness, anal fissure or external hemorrhoid.  Musculoskeletal:     Cervical back: Neck supple.  Skin:    General: Skin is warm and dry.           Assessment & Plan:   Problem List Items Addressed This Visit       Digestive   Bright red rectal bleeding - Primary    Likely hemorrhoid induced given  information in HPI. Hemoccult card in the office today negative.  Given that she's never undergone a colonoscopy, coupled with recent symptoms, will refer to GI for further evaluation.  She agrees.   UA today negative.      Relevant Orders   Ambulatory referral to Gastroenterology   POCT occult blood stool (Completed)     Other   Suprapubic pressure    Unclear etiology. Could be descending bladder. Lower suspicion for pelvic mass, but will keep in  mind.  Offered pelvic/transvainal [place] for which we decided to hold at this time. She will meet with GI first. Referral placed.  She will also notify if her symptoms progress or change, at that point we will pursue imaging/further work up.      Relevant Orders   POCT Urinalysis Dipstick (Automated) (Completed)       Pleas Koch, NP

## 2021-09-01 NOTE — Assessment & Plan Note (Addendum)
Unclear etiology. Could be descending bladder. Lower suspicion for pelvic mass, but will keep in mind.  Offered pelvic/transvainal [place] for which we decided to hold at this time. She will meet with GI first. Referral placed.  She will also notify if her symptoms progress or change, at that point we will pursue imaging/further work up.

## 2021-09-01 NOTE — Assessment & Plan Note (Addendum)
Likely hemorrhoid induced given information in HPI. Hemoccult card in the office today negative.  Given that she's never undergone a colonoscopy, coupled with recent symptoms, will refer to GI for further evaluation.  She agrees.   UA today negative.

## 2021-09-01 NOTE — Patient Instructions (Signed)
You will be contacted regarding your referral to GI.  Please let us know if you have not been contacted within two weeks.   Please notify me if your symptoms increase or change as discussed.   It was a pleasure to see you today!

## 2021-09-07 NOTE — Telephone Encounter (Signed)
Ashtyn, can you take a look at her request? The referral was for rectal bleeding. She's also never had a colonoscopy.

## 2021-09-08 DIAGNOSIS — M9901 Segmental and somatic dysfunction of cervical region: Secondary | ICD-10-CM | POA: Diagnosis not present

## 2021-09-08 DIAGNOSIS — M9903 Segmental and somatic dysfunction of lumbar region: Secondary | ICD-10-CM | POA: Diagnosis not present

## 2021-09-08 DIAGNOSIS — M9905 Segmental and somatic dysfunction of pelvic region: Secondary | ICD-10-CM | POA: Diagnosis not present

## 2021-09-08 DIAGNOSIS — M5136 Other intervertebral disc degeneration, lumbar region: Secondary | ICD-10-CM | POA: Diagnosis not present

## 2021-09-20 ENCOUNTER — Telehealth: Payer: Self-pay

## 2021-09-20 ENCOUNTER — Other Ambulatory Visit: Payer: Self-pay

## 2021-09-20 DIAGNOSIS — Z1211 Encounter for screening for malignant neoplasm of colon: Secondary | ICD-10-CM

## 2021-09-20 MED ORDER — CLENPIQ 10-3.5-12 MG-GM -GM/160ML PO SOLN: 320.0000 mL | ORAL | 0 refills | Status: DC

## 2021-09-20 NOTE — Telephone Encounter (Signed)
Gastroenterology Pre-Procedure Review  Request Date: 09/24/21 Requesting Physician: Dr. Marius Ditch  PATIENT REVIEW QUESTIONS: The patient responded to the following health history questions as indicated:    1. Are you having any GI issues? yes (small amount of rectal bleeding) 2. Do you have a personal history of Polyps? no 3. Do you have a family history of Colon Cancer or Polyps? no 4. Diabetes Mellitus? no 5. Joint replacements in the past 12 months?no 6. Major health problems in the past 3 months?no 7. Any artificial heart valves, MVP, or defibrillator?no    MEDICATIONS & ALLERGIES:    Patient reports the following regarding taking any anticoagulation/antiplatelet therapy:   Plavix, Coumadin, Eliquis, Xarelto, Lovenox, Pradaxa, Brilinta, or Effient? no Aspirin? no  Patient confirms/reports the following medications:  Current Outpatient Medications  Medication Sig Dispense Refill   CALCIUM-VITAMIN D PO Take 2 tablets by mouth daily.     ibuprofen (ADVIL,MOTRIN) 200 MG tablet Use as directed     Multiple Vitamin (MULTIVITAMIN) tablet Take 1 tablet by mouth daily.     simvastatin (ZOCOR) 20 MG tablet TAKE 1 TABLET(20 MG) BY MOUTH AT BEDTIME FOR CHOLESTEROL 90 tablet 2   No current facility-administered medications for this visit.    Patient confirms/reports the following allergies:  Allergies  Allergen Reactions   Ezetimibe-Simvastatin     REACTION: Achey   Hydrocodone     REACTION: Itchy, rash   Prednisone     REACTION: anxiety, hyper    No orders of the defined types were placed in this encounter.   AUTHORIZATION INFORMATION Primary Insurance: 1D#: Group #:  Secondary Insurance: 1D#: Group #:  SCHEDULE INFORMATION: Date:  Time: Location:

## 2021-09-21 ENCOUNTER — Other Ambulatory Visit: Payer: Self-pay

## 2021-09-21 DIAGNOSIS — H40003 Preglaucoma, unspecified, bilateral: Secondary | ICD-10-CM | POA: Diagnosis not present

## 2021-09-21 DIAGNOSIS — H2513 Age-related nuclear cataract, bilateral: Secondary | ICD-10-CM | POA: Diagnosis not present

## 2021-09-21 DIAGNOSIS — H43813 Vitreous degeneration, bilateral: Secondary | ICD-10-CM | POA: Diagnosis not present

## 2021-09-22 ENCOUNTER — Encounter: Payer: Self-pay | Admitting: Orthopaedic Surgery

## 2021-09-22 ENCOUNTER — Ambulatory Visit: Payer: Medicare Other | Admitting: Orthopaedic Surgery

## 2021-09-22 ENCOUNTER — Other Ambulatory Visit: Payer: Self-pay

## 2021-09-22 ENCOUNTER — Telehealth: Payer: Self-pay

## 2021-09-22 DIAGNOSIS — M1711 Unilateral primary osteoarthritis, right knee: Secondary | ICD-10-CM

## 2021-09-22 MED ORDER — BUPIVACAINE HCL 0.25 % IJ SOLN
2.0000 mL | INTRAMUSCULAR | Status: AC | PRN
  Administered 2021-09-22: 2 mL via INTRA_ARTICULAR

## 2021-09-22 MED ORDER — LIDOCAINE HCL 1 % IJ SOLN
2.0000 mL | INTRAMUSCULAR | Status: AC | PRN
  Administered 2021-09-22: 2 mL

## 2021-09-22 MED ORDER — METHYLPREDNISOLONE ACETATE 40 MG/ML IJ SUSP
80.0000 mg | INTRAMUSCULAR | Status: AC | PRN
  Administered 2021-09-22: 80 mg via INTRA_ARTICULAR

## 2021-09-22 MED ORDER — PEG 3350-KCL-NA BICARB-NACL 420 G PO SOLR: 4000.0000 mL | Freq: Once | ORAL | 0 refills | Status: AC

## 2021-09-22 NOTE — Telephone Encounter (Signed)
Returned patients call to inquire which pharmacy she would like her prep called to.  She has two pharmacies listed in her chart.  Thanks,  [place], [place]

## 2021-09-22 NOTE — Progress Notes (Signed)
Office Visit Note   Patient: Natalie Sosa           Date of Birth: 04-03-1945           MRN: [id number] Visit Date: 09/22/2021              Requested by: Pleas Koch, NP [place],  [place] [id number] PCP: Pleas Koch, NP   Assessment & Plan: Visit Diagnoses:  1. Unilateral primary osteoarthritis, right knee     Plan: Ms. Berkley has a history of osteoarthritis in the right knee.  She had films in November 2022 demonstrating degenerative changes predominantly in the lateral compartment with some joint space narrowing slight valgus and peripheral osteophytes.  Does play a lot of tennis and notes that she has had some recurrent pain and occasional swelling.  Long discussion regarding her arthritis and treatment options.  She would like to try a cortisone injection.  This was performed after an alcohol Betadine prep.  Discussed potential side effects.  We will monitor her response and check her back in the office over the next several weeks if no improvement.  We have also discussed use of NSAIDs, and Voltaren gel ice and heat as well as a pullover support  Follow-Up Instructions: Return if symptoms worsen or fail to improve.   Orders:  No orders of the defined types were placed in this encounter.  No orders of the defined types were placed in this encounter.     Procedures: Large Joint Inj: R knee on 09/22/2021 9:40 AM Indications: pain and diagnostic evaluation Details: 25 G 1.5 in needle, anteromedial approach  Arthrogram: No  Medications: 2 mL lidocaine 1 %; 80 mg methylPREDNISolone acetate 40 MG/ML; 2 mL bupivacaine 0.25 % Procedure, treatment alternatives, risks and benefits explained, specific risks discussed. Consent was given by the patient. Immediately prior to procedure a time out was called to verify the correct patient, procedure, equipment, support staff and site/side marked as required. Patient was prepped and draped in the usual sterile fashion.        Clinical Data: No additional findings.   Subjective: Chief Complaint  Patient presents with   Right Leg - Pain  Recent recurrent pain right knee pain and, particularly laterally.  Had an exacerbation after playing tennis.  Feeling better now after resting ice and anti-inflammatory medicines  HPI  Review of Systems   Objective: Vital Signs: There were no vitals taken for this visit.  Physical Exam Constitutional:      Appearance: She is well-developed.  Eyes:     Pupils: Pupils are equal, round, and reactive to light.  Pulmonary:     Effort: Pulmonary effort is normal.  Skin:    General: Skin is warm and dry.  Neurological:     Mental Status: She is alert and oriented to person, place, and time.  Psychiatric:        Behavior: Behavior normal.     Ortho Exam awake alert and oriented x3.  Comfortable sitting.  No effusion right knee.  Mostly lateral joint pain more posterior than anterior.  No localized areas of tenderness along the lateral distal thigh or proximal lateral tibia.  Lacked a few degrees to full extension.  No effusion.  No instability.  Slight increased valgus.  No popliteal pain or mass or calf discomfort.  Motor exam intact  Specialty Comments:  No specialty comments available.  Imaging: No results found.   PMFS History: Patient Active  Problem List   Diagnosis Date Noted   Unilateral primary osteoarthritis, right knee 09/22/2021   Bright red rectal bleeding 09/01/2021   Suprapubic pressure 09/01/2021   Preventative health care 01/11/2016   Insomnia 01/01/2014   HYPERCHOLESTEROLEMIA 10/04/2006   Past Medical History:  Diagnosis Date   Cancer ([place])    skin   COVID-19 12/31/2020   ENDOMETRIOSIS 10/04/2006   Qualifier: History of  By: Council Mechanic MD, Hilaria Ota     Family History  Problem Relation Age of Onset   Heart disease Mother    Dementia Mother    Cancer Father        Pancreatic   Stroke Father    Breast cancer Paternal  Grandmother 58   Breast cancer Paternal Aunt     Past Surgical History:  Procedure Laterality Date   ACHILLES TENDON REPAIR  06/14/2004   CESAREAN SECTION     placenta previa   CHOLECYSTECTOMY  06/13/2001   Social History   Occupational History   Not on file  Tobacco Use   Smoking status: Never   Smokeless tobacco: Never  Vaping Use   Vaping Use: Never used  Substance and Sexual Activity   Alcohol use: Yes    Comment: glass of wine occ   Drug use: Never   Sexual activity: Yes     Garald Balding, MD   Note - This record has been created using Editor, commissioning.  Chart creation errors have been sought, but may not always  have been located. Such creation errors do not reflect on  the standard of medical care.

## 2021-09-23 DIAGNOSIS — Z85828 Personal history of other malignant neoplasm of skin: Secondary | ICD-10-CM | POA: Diagnosis not present

## 2021-09-23 DIAGNOSIS — D2261 Melanocytic nevi of right upper limb, including shoulder: Secondary | ICD-10-CM | POA: Diagnosis not present

## 2021-09-23 DIAGNOSIS — D2272 Melanocytic nevi of left lower limb, including hip: Secondary | ICD-10-CM | POA: Diagnosis not present

## 2021-09-23 DIAGNOSIS — D2262 Melanocytic nevi of left upper limb, including shoulder: Secondary | ICD-10-CM | POA: Diagnosis not present

## 2021-09-24 ENCOUNTER — Ambulatory Visit: Payer: Medicare Other | Admitting: Registered Nurse

## 2021-09-24 ENCOUNTER — Encounter: Payer: Self-pay | Admitting: Gastroenterology

## 2021-09-24 ENCOUNTER — Encounter
Admission: RE | Disposition: A | Discharge status: Home / Self Care | Payer: Self-pay | Source: Home / Self Care | Attending: Gastroenterology

## 2021-09-24 ENCOUNTER — Ambulatory Visit
Admission: RE | Admit: 2021-09-24 | Discharge: 2021-09-24 | Disposition: A | Discharge status: Home / Self Care | Payer: Medicare Other | Attending: Gastroenterology | Admitting: Gastroenterology

## 2021-09-24 DIAGNOSIS — D120 Benign neoplasm of cecum: Secondary | ICD-10-CM | POA: Diagnosis not present

## 2021-09-24 DIAGNOSIS — K5730 Diverticulosis of large intestine without perforation or abscess without bleeding: Secondary | ICD-10-CM | POA: Insufficient documentation

## 2021-09-24 DIAGNOSIS — M1990 Unspecified osteoarthritis, unspecified site: Secondary | ICD-10-CM | POA: Diagnosis not present

## 2021-09-24 DIAGNOSIS — K635 Polyp of colon: Secondary | ICD-10-CM

## 2021-09-24 DIAGNOSIS — Z1211 Encounter for screening for malignant neoplasm of colon: Secondary | ICD-10-CM | POA: Diagnosis not present

## 2021-09-24 HISTORY — PX: COLONOSCOPY WITH PROPOFOL: SHX5780

## 2021-09-24 SURGERY — COLONOSCOPY WITH PROPOFOL
Anesthesia: General

## 2021-09-24 MED ORDER — ONDANSETRON HCL 4 MG/2ML IJ SOLN
INTRAMUSCULAR | Status: AC
  Filled 2021-09-24: qty 2

## 2021-09-24 MED ORDER — PHENYLEPHRINE 80 MCG/ML (10ML) SYRINGE FOR IV PUSH (FOR BLOOD PRESSURE SUPPORT)
PREFILLED_SYRINGE | INTRAVENOUS | Status: AC
  Filled 2021-09-24: qty 10

## 2021-09-24 MED ORDER — PROPOFOL 1000 MG/100ML IV EMUL
INTRAVENOUS | Status: AC
  Filled 2021-09-24: qty 100

## 2021-09-24 MED ORDER — LIDOCAINE HCL (CARDIAC) PF 100 MG/5ML IV SOSY
PREFILLED_SYRINGE | INTRAVENOUS | Status: DC | PRN
Start: 2021-09-24 — End: 2021-09-24
  Administered 2021-09-24: 100 mg via INTRAVENOUS

## 2021-09-24 MED ORDER — LIDOCAINE HCL (PF) 2 % IJ SOLN
INTRAMUSCULAR | Status: AC
  Filled 2021-09-24: qty 20

## 2021-09-24 MED ORDER — ONDANSETRON HCL 4 MG/2ML IJ SOLN
INTRAMUSCULAR | Status: DC | PRN
  Administered 2021-09-24: 4 mg via INTRAVENOUS

## 2021-09-24 MED ORDER — PROPOFOL 10 MG/ML IV BOLUS
INTRAVENOUS | Status: AC
  Filled 2021-09-24: qty 20

## 2021-09-24 MED ORDER — EPHEDRINE 5 MG/ML INJ
INTRAVENOUS | Status: AC
  Filled 2021-09-24: qty 5

## 2021-09-24 MED ORDER — PROPOFOL 10 MG/ML IV BOLUS
INTRAVENOUS | Status: DC | PRN
  Administered 2021-09-24: 70 mg via INTRAVENOUS

## 2021-09-24 MED ORDER — ROCURONIUM BROMIDE 10 MG/ML (PF) SYRINGE
PREFILLED_SYRINGE | INTRAVENOUS | Status: AC
  Filled 2021-09-24: qty 10

## 2021-09-24 MED ORDER — SUCCINYLCHOLINE CHLORIDE 200 MG/10ML IV SOSY
PREFILLED_SYRINGE | INTRAVENOUS | Status: AC
  Filled 2021-09-24: qty 10

## 2021-09-24 MED ORDER — LABETALOL HCL 5 MG/ML IV SOLN
INTRAVENOUS | Status: AC
  Filled 2021-09-24: qty 4

## 2021-09-24 MED ORDER — PROPOFOL 500 MG/50ML IV EMUL
INTRAVENOUS | Status: DC | PRN
Start: 2021-09-24 — End: 2021-09-24
  Administered 2021-09-24: 175 ug/kg/min via INTRAVENOUS

## 2021-09-24 MED ORDER — SODIUM CHLORIDE 0.9 % IV SOLN: INTRAVENOUS | Status: DC

## 2021-09-24 MED ORDER — DEXMEDETOMIDINE HCL IN NACL 80 MCG/20ML IV SOLN
INTRAVENOUS | Status: AC
  Filled 2021-09-24: qty 20

## 2021-09-24 NOTE — Anesthesia Preprocedure Evaluation (Signed)
Anesthesia Evaluation  Patient identified by MRN, date of birth, ID band Patient awake    Reviewed: Allergy & Precautions, NPO status , Patient's Chart, lab work & pertinent test results  Airway Mallampati: II  TM Distance: >3 FB Neck ROM: Full    Dental  (+) Teeth Intact   Pulmonary neg pulmonary ROS,    Pulmonary exam normal breath sounds clear to auscultation       Cardiovascular negative cardio ROS Normal cardiovascular exam Rhythm:Regular     Neuro/Psych negative neurological ROS  negative psych ROS   GI/Hepatic negative GI ROS, Neg liver ROS,   Endo/Other  negative endocrine ROS  Renal/GU negative Renal ROS  negative genitourinary   Musculoskeletal  (+) Arthritis ,   Abdominal Normal abdominal exam  (+)   Peds negative pediatric ROS (+)  Hematology negative hematology ROS (+)   Anesthesia Other Findings Past Medical History: No date: Cancer ([place])     Comment:  skin 12/31/2020: COVID-19 10/04/2006: ENDOMETRIOSIS     Comment:  Qualifier: History of  By: Council Mechanic MD, Hilaria Ota   Past Surgical History: 06/14/2004: ACHILLES TENDON REPAIR No date: CESAREAN SECTION     Comment:  placenta previa 06/13/2001: CHOLECYSTECTOMY     Reproductive/Obstetrics negative OB ROS                             Anesthesia Physical Anesthesia Plan  ASA: 2  Anesthesia Plan: General   Post-op Pain Management:    Induction: Intravenous  PONV Risk Score and Plan: Propofol infusion and TIVA  Airway Management Planned: Natural Airway  Additional Equipment:   Intra-op Plan:   Post-operative Plan:   Informed Consent: I have reviewed the patients History and Physical, chart, labs and discussed the procedure including the risks, benefits and alternatives for the proposed anesthesia with the patient or authorized representative who has indicated his/her understanding and acceptance.      Dental Advisory Given  Plan Discussed with: CRNA and Surgeon  Anesthesia Plan Comments:         Anesthesia Quick Evaluation

## 2021-09-24 NOTE — H&P (Addendum)
Natalie Darby, MD [address]  [place]  [place], [place] [address]  Main: [phone]  Fax: [phone] Pager: [phone]  Primary Care Physician:  Pleas Koch, NP Primary Gastroenterologist:  Dr. Cephas Sosa  Pre-Procedure History & Physical: HPI:  Natalie Sosa is a 76 y.o. female is here for an colonoscopy.   Past Medical History:  Diagnosis Date   Cancer [facility])    skin   COVID-19 12/31/2020   ENDOMETRIOSIS 10/04/2006   Qualifier: History of  By: Council Mechanic MD, Hilaria Ota     Past Surgical History:  Procedure Laterality Date   ACHILLES TENDON REPAIR  06/14/2004   CESAREAN SECTION     placenta previa   CHOLECYSTECTOMY  06/13/2001    Prior to Admission medications   Medication Sig Start Date End Date Taking? Authorizing Provider  CALCIUM-VITAMIN D PO Take 2 tablets by mouth daily.   Yes [provider]  Multiple Vitamin (MULTIVITAMIN) tablet Take 1 tablet by mouth daily.   Yes [provider]  simvastatin (ZOCOR) 20 MG tablet TAKE 1 TABLET(20 MG) BY MOUTH AT BEDTIME FOR CHOLESTEROL 03/31/21  Yes Pleas Koch, NP  Sod Picosulfate-Mag Ox-Cit Acd (CLENPIQ) 10-3.5-12 MG-GM -GM/160ML SOLN Take 320 mLs by mouth as directed. 09/20/21  Yes Rusty Villella, Tally Due, MD  ibuprofen (ADVIL,MOTRIN) 200 MG tablet Use as directed    [provider]    Allergies as of 09/21/2021 - Review Complete 09/01/2021  Allergen Reaction Noted   Ezetimibe-simvastatin     Hydrocodone     Prednisone  10/03/2006    Family History  Problem Relation Age of Onset   Heart disease Mother    Dementia Mother    Cancer Father        Pancreatic   Stroke Father    Breast cancer Paternal Grandmother 73   Breast cancer Paternal Aunt     Social History   Socioeconomic History   Marital status: Married    Spouse name: Not on file   Number of children: 1   Years of education: Not on file   Highest education level: Not on file  Occupational History    Not on file  Tobacco Use   Smoking status: Never   Smokeless tobacco: Never  Vaping Use   Vaping Use: Never used  Substance and Sexual Activity   Alcohol use: Yes    Comment: glass of wine occ   Drug use: Never   Sexual activity: Yes  Other Topics Concern   Not on file  Social History Narrative   Husband Gershon Mussel, aware of her end of life wishes.  She would desire CPR, no feedings tubes.   Social Determinants of Health   Financial Resource Strain: Low Risk  (02/08/2021)   Overall Financial Resource Strain (CARDIA)    Difficulty of Paying Living Expenses: Not hard at all  Food Insecurity: No Food Insecurity (02/08/2021)   Hunger Vital Sign    Worried About Running Out of Food in the Last Year: Never true    Ran Out of Food in the Last Year: Never true  Transportation Needs: No Transportation Needs (02/08/2021)   PRAPARE - Hydrologist (Medical): No    Lack of Transportation (Non-Medical): No  Physical Activity: Sufficiently Active (02/08/2021)   Exercise Vital Sign    Days of Exercise per Week: 3 days    Minutes of Exercise per Session: 120 min  Stress: No Stress Concern Present (02/08/2021)  [employer] - Occupational Stress Questionnaire    Feeling of Stress : Not at all  Social Connections: Moderately Integrated (02/08/2021)   Social Connection and Isolation Panel [NHANES]    Frequency of Communication with Friends and Family: More than three times a week    Frequency of Social Gatherings with Friends and Family: More than three times a week    Attends Religious Services: Never    Marine scientist or Organizations: Yes    Attends Music therapist: More than 4 times per year    Marital Status: Married  Human resources officer Violence: Not At Risk (02/08/2021)   Humiliation, Afraid, Rape, and Kick questionnaire    Fear of Current or Ex-Partner: No    Emotionally Abused: No    Physically Abused: No    Sexually  Abused: No    Review of Systems: See HPI, otherwise negative ROS  Physical Exam: BP 134/77   Pulse 68   Temp (!) 96.8 F (36 C) (Temporal)   Resp 16   Ht '5\' 6"'$  (1.676 m)   Wt 72.7 kg   SpO2 100%   BMI 25.88 kg/m  General:   Alert,  pleasant and cooperative in NAD Head:  Normocephalic and atraumatic. Neck:  Supple; no masses or thyromegaly. Lungs:  Clear throughout to auscultation.    Heart:  Regular rate and rhythm. Abdomen:  Soft, nontender and nondistended. Normal bowel sounds, without guarding, and without rebound.   Neurologic:  Alert and  oriented x4;  grossly normal neurologically.  Impression/Plan: Natalie Sosa is here for an colonoscopy to be performed for colon cancer screening  Risks, benefits, limitations, and alternatives regarding  colonoscopy have been reviewed with the patient.  Questions have been answered.  All parties agreeable.   Sherri Sear, MD  09/24/2021, 7:44 AM

## 2021-09-24 NOTE — Anesthesia Postprocedure Evaluation (Signed)
Anesthesia Post Note  Patient: Natalie Sosa  Procedure(s) Performed: COLONOSCOPY WITH PROPOFOL  Patient location during evaluation: PACU Anesthesia Type: General Level of consciousness: awake and awake and alert Pain management: satisfactory to patient Vital Signs Assessment: post-procedure vital signs reviewed and stable Respiratory status: nonlabored ventilation Cardiovascular status: stable Anesthetic complications: no   No notable events documented.   Last Vitals:  Vitals:   09/24/21 0826 09/24/21 0836  BP: 105/63 121/68  Pulse: 68 60  Resp: 15 20  Temp:    SpO2: 97% 98%    Last Pain:  Vitals:   09/24/21 0836  TempSrc:   PainSc: 0-No pain                 VAN STAVEREN,Morayma [place]

## 2021-09-24 NOTE — Op Note (Signed)
[facility] Gastroenterology Patient Name: Natalie Sosa Procedure Date: 09/24/2021 7:00 AM MRN: [id number] Account #: [id number] Date of Birth: 09-Feb-1945 Admit Type: Outpatient Age: 76 Room: [facility] ENDO ROOM 2 Gender: Female Note Status: Finalized Instrument Name: Jasper Riling [id number] Procedure:             Colonoscopy Indications:           Screening for colorectal malignant neoplasm, This is                         the patient's first colonoscopy Providers:             Lin Landsman MD, MD Referring MD:          Pleas Koch (Referring MD) Medicines:             General Anesthesia Complications:         No immediate complications. Estimated blood loss: None. Procedure:             Pre-Anesthesia Assessment:                        - Prior to the procedure, a History and Physical was                         performed, and patient medications and allergies were                         reviewed. The patient is competent. The risks and                         benefits of the procedure and the sedation options and                         risks were discussed with the patient. All questions                         were answered and informed consent was obtained.                         Patient identification and proposed procedure were                         verified by the physician, the nurse, the                         anesthesiologist, the anesthetist and the technician                         in the pre-procedure area in the procedure room in the                         endoscopy suite. Mental Status Examination: alert and                         oriented. Airway Examination: normal oropharyngeal                         airway and neck mobility. Respiratory Examination:  clear to auscultation. CV Examination: normal.                         Prophylactic Antibiotics: The patient does not require                         prophylactic  antibiotics. Prior Anticoagulants: The                         patient has taken no previous anticoagulant or                         antiplatelet agents. ASA Grade Assessment: II - A                         patient with mild systemic disease. After reviewing                         the risks and benefits, the patient was deemed in                         satisfactory condition to undergo the procedure. The                         anesthesia plan was to use general anesthesia.                         Immediately prior to administration of medications,                         the patient was re-assessed for adequacy to receive                         sedatives. The heart rate, respiratory rate, oxygen                         saturations, blood pressure, adequacy of pulmonary                         ventilation, and response to care were monitored                         throughout the procedure. The physical status of the                         patient was re-assessed after the procedure.                        After obtaining informed consent, the colonoscope was                         passed under direct vision. Throughout the procedure,                         the patient's blood pressure, pulse, and oxygen                         saturations were monitored continuously. The  Colonoscope was introduced through the anus and                         advanced to the the cecum, identified by appendiceal                         orifice and ileocecal valve. The colonoscopy was                         performed without difficulty. The patient tolerated                         the procedure well. The quality of the bowel                         preparation was evaluated using the BBPS [facility] Bowel                         Preparation Scale) with scores of: Right Colon = 3,                         Transverse Colon = 3 and Left Colon = 3 (entire mucosa                          seen well with no residual staining, small fragments                         of stool or opaque liquid). The total BBPS score                         equals 9. Findings:      The perianal and digital rectal examinations were normal. Pertinent       negatives include normal sphincter tone and no palpable rectal lesions.      Two sessile polyps were found in the descending colon and cecum. The       polyps were 3 mm in size. These polyps were removed with a cold snare.       Resection and retrieval were complete.      A diminutive polyp was found in the appendiceal orifice. The polyp was       sessile. The polyp was removed with a cold biopsy forceps. Resection and       retrieval were complete.      Multiple small and large-mouthed diverticula were found in the entire       colon. There was no evidence of diverticular bleeding.      The retroflexed view of the distal rectum and anal verge was normal and       showed no anal or rectal abnormalities. Impression:            - Two 3 mm polyps in the descending colon and in the                         cecum, removed with a cold snare. Resected and                         retrieved.                        -  One diminutive polyp at the appendiceal orifice,                         removed with a cold biopsy forceps. Resected and                         retrieved.                        - Severe diverticulosis in the entire examined colon.                         There was no evidence of diverticular bleeding.                        - The distal rectum and anal verge are normal on                         retroflexion view. Recommendation:        - Discharge patient to home (with escort).                        - Resume regular diet today.                        - Continue present medications.                        - Await pathology results.                        - Repeat colonoscopy in 5 to 7 years for surveillance                          based on pathology results. Procedure Code(s):     --- Professional ---                        [phone], Colonoscopy, flexible; with removal of                         tumor(s), polyp(s), or other lesion(s) by snare                         technique                        45380, 51, Colonoscopy, flexible; with biopsy, single                         or multiple Diagnosis Code(s):     --- Professional ---                        K63.5, Polyp of colon                        Z12.11, Encounter for screening for malignant neoplasm                         of colon  K57.30, Diverticulosis of large intestine without                         perforation or abscess without bleeding CPT copyright 2019 American Medical Association. All rights reserved. The codes documented in this report are preliminary and upon coder review may  be revised to meet current compliance requirements. Dr. Ulyess Mort Lin Landsman MD, MD 09/24/2021 8:15:46 AM This report has been signed electronically. Number of Addenda: 0 Note Initiated On: 09/24/2021 7:00 AM Scope Withdrawal Time: 0 hours 14 minutes 36 seconds  Total Procedure Duration: 0 hours 18 minutes 22 seconds  Estimated Blood Loss:  Estimated blood loss: none.      [facility]

## 2021-09-24 NOTE — Transfer of Care (Signed)
Immediate Anesthesia Transfer of Care Note  Patient: Natalie Sosa  Procedure(s) Performed: COLONOSCOPY WITH PROPOFOL  Patient Location: Endoscopy Unit  Anesthesia Type:General  Level of Consciousness: drowsy  Airway & Oxygen Therapy: Patient Spontanous Breathing  Post-op Assessment: Report given to RN and Post -op Vital signs reviewed and stable  Post vital signs: Reviewed and stable  Last Vitals:  Vitals Value Taken Time  BP 97/63 09/24/21 0816  Temp    Pulse    Resp    SpO2 96 % 09/24/21 0816    Last Pain:  Vitals:   09/24/21 0816  TempSrc:   PainSc: Asleep         Complications: No notable events documented.

## 2021-09-28 ENCOUNTER — Encounter: Payer: Self-pay | Admitting: Gastroenterology

## 2021-09-28 LAB — SURGICAL PATHOLOGY

## 2021-09-30 NOTE — Telephone Encounter (Signed)
Immunizations updated in chart.  No further action needed at this time.

## 2021-10-06 DIAGNOSIS — M9903 Segmental and somatic dysfunction of lumbar region: Secondary | ICD-10-CM | POA: Diagnosis not present

## 2021-10-06 DIAGNOSIS — M5136 Other intervertebral disc degeneration, lumbar region: Secondary | ICD-10-CM | POA: Diagnosis not present

## 2021-10-06 DIAGNOSIS — M9901 Segmental and somatic dysfunction of cervical region: Secondary | ICD-10-CM | POA: Diagnosis not present

## 2021-10-06 DIAGNOSIS — M9905 Segmental and somatic dysfunction of pelvic region: Secondary | ICD-10-CM | POA: Diagnosis not present

## 2021-11-03 DIAGNOSIS — M955 Acquired deformity of pelvis: Secondary | ICD-10-CM | POA: Diagnosis not present

## 2021-11-03 DIAGNOSIS — M5136 Other intervertebral disc degeneration, lumbar region: Secondary | ICD-10-CM | POA: Diagnosis not present

## 2021-11-03 DIAGNOSIS — M9905 Segmental and somatic dysfunction of pelvic region: Secondary | ICD-10-CM | POA: Diagnosis not present

## 2021-11-03 DIAGNOSIS — M9903 Segmental and somatic dysfunction of lumbar region: Secondary | ICD-10-CM | POA: Diagnosis not present

## 2021-12-01 DIAGNOSIS — M955 Acquired deformity of pelvis: Secondary | ICD-10-CM | POA: Diagnosis not present

## 2021-12-01 DIAGNOSIS — M9905 Segmental and somatic dysfunction of pelvic region: Secondary | ICD-10-CM | POA: Diagnosis not present

## 2021-12-01 DIAGNOSIS — M9903 Segmental and somatic dysfunction of lumbar region: Secondary | ICD-10-CM | POA: Diagnosis not present

## 2021-12-01 DIAGNOSIS — M5136 Other intervertebral disc degeneration, lumbar region: Secondary | ICD-10-CM | POA: Diagnosis not present

## 2021-12-19 ENCOUNTER — Other Ambulatory Visit: Payer: Self-pay | Admitting: Primary Care

## 2021-12-19 DIAGNOSIS — E7800 Pure hypercholesterolemia, unspecified: Secondary | ICD-10-CM

## 2021-12-28 ENCOUNTER — Ambulatory Visit (INDEPENDENT_AMBULATORY_CARE_PROVIDER_SITE_OTHER): Payer: Medicare Other

## 2021-12-28 ENCOUNTER — Ambulatory Visit: Payer: Medicare Other | Admitting: Orthopaedic Surgery

## 2021-12-28 ENCOUNTER — Encounter: Payer: Self-pay | Admitting: Orthopaedic Surgery

## 2021-12-28 DIAGNOSIS — M1811 Unilateral primary osteoarthritis of first carpometacarpal joint, right hand: Secondary | ICD-10-CM

## 2021-12-28 DIAGNOSIS — M79644 Pain in right finger(s): Secondary | ICD-10-CM

## 2021-12-28 DIAGNOSIS — M1711 Unilateral primary osteoarthritis, right knee: Secondary | ICD-10-CM

## 2021-12-28 MED ORDER — BUPIVACAINE HCL 0.25 % IJ SOLN
2.0000 mL | INTRAMUSCULAR | Status: AC | PRN
  Administered 2021-12-28: 2 mL via INTRA_ARTICULAR

## 2021-12-28 MED ORDER — METHYLPREDNISOLONE ACETATE 40 MG/ML IJ SUSP
80.0000 mg | INTRAMUSCULAR | Status: AC | PRN
  Administered 2021-12-28: 80 mg via INTRA_ARTICULAR

## 2021-12-28 MED ORDER — LIDOCAINE HCL 1 % IJ SOLN
2.0000 mL | INTRAMUSCULAR | Status: AC | PRN
  Administered 2021-12-28: 2 mL

## 2021-12-28 NOTE — Progress Notes (Signed)
Office Visit Note   Patient: Natalie Sosa           Date of Birth: May 02, 1945           MRN: [id number] Visit Date: 12/28/2021              Requested by: Pleas Koch, NP [place],  [place] [address] PCP: Pleas Koch, NP   Assessment & Plan: Visit Diagnoses:  1. Pain of right thumb   2. Unilateral primary osteoarthritis, right knee   3. Arthritis of carpometacarpal [facility]) joint of right thumb     Plan: Natalie Sosa is having recurrent symptoms of arthritis predominantly in the lateral compartment of her right knee.  She has had prior viscosupplementation with minimal relief and wanted another cortisone injection.  Also wanted to discuss bracing as she is active playing tennis.  We did apply a spider brace and she felt like this was going to make a big difference.  I also injected her knee with cortisone after trying the brace.  We also discussed pain in the base of the right thumb.  She has mild subluxation and x-rays consistent with some mild osteoarthritis.  She has tried Voltaren gel which is helped in the past and does wear aids wraparound brace which also helps.  We discussed cortisone injections in the future even more supportive braces and possibly even surgery.  For the moment she is comfortable all questions were answered  Follow-Up Instructions: Return if symptoms worsen or fail to improve.   Orders:  Orders Placed This Encounter  Procedures   XR Finger Thumb Right   No orders of the defined types were placed in this encounter.     Procedures: Large Joint Inj: R knee on 12/28/2021 1:45 PM Indications: pain and diagnostic evaluation Details: 25 G 1.5 in needle, anterolateral approach  Arthrogram: No  Medications: 2 mL lidocaine 1 %; 80 mg methylPREDNISolone acetate 40 MG/ML; 2 mL bupivacaine 0.25 % Procedure, treatment alternatives, risks and benefits explained, specific risks discussed. Consent was given by the patient. Immediately prior to  procedure a time out was called to verify the correct patient, procedure, equipment, support staff and site/side marked as required. Patient was prepped and draped in the usual sterile fashion.       Clinical Data: No additional findings.   Subjective: Chief Complaint  Patient presents with   Right Knee - Pain   Right Thumb - Pain  Natalie Sosa is experiencing recurrent symptoms of osteoarthritis right knee.  X-rays from a year ago demonstrated predominately lateral compartment changes.  She has had viscosupplementation with minimal relief but would like to have a cortisone injection.  She still very active and is having some issues with her knee giving way and wanted to check into a brace.  Also having some problems with pain at the base of her right thumb but tickly when she nets and perform some other activities around the house  HPI  Review of Systems   Objective: Vital Signs: There were no vitals taken for this visit.  Physical Exam Constitutional:      Appearance: She is well-developed.  Pulmonary:     Effort: Pulmonary effort is normal.  Skin:    General: Skin is warm and dry.  Neurological:     Mental Status: She is alert and oriented to person, place, and time.  Psychiatric:        Behavior: Behavior normal.  Ortho Exam right knee was not hot red warm or swollen.  Slight increased valgus with weightbearing.  Mostly lateral joint pain.  No effusion.  No instability.  Mild patella crepitation but no pain with compression no distal edema.  No popliteal pain.  Minimal pain with rotation at the right thumb carpometacarpal joint and slight hypertrophy.  No instability.  No swelling.  Neurologically intact  Specialty Comments:  No specialty comments available.  Imaging: XR Finger Thumb Right  Result Date: 12/28/2021 Films of the right thumb obtained in several projections.  There are some mild arthritic change at the carpometacarpal joint.  Very minimal  subluxation    PMFS History: Patient Active Problem List   Diagnosis Date Noted   Arthritis of carpometacarpal [facility]) joint of right thumb 12/28/2021   Polyp of cecum    Unilateral primary osteoarthritis, right knee 09/22/2021   Bright red rectal bleeding 09/01/2021   Suprapubic pressure 09/01/2021   Screen for colon cancer 01/11/2016   Insomnia 01/01/2014   HYPERCHOLESTEROLEMIA 10/04/2006   Past Medical History:  Diagnosis Date   Cancer ([place])    skin   COVID-19 12/31/2020   ENDOMETRIOSIS 10/04/2006   Qualifier: History of  By: Council Mechanic MD, Hilaria Ota     Family History  Problem Relation Age of Onset   Heart disease Mother    Dementia Mother    Cancer Father        Pancreatic   Stroke Father    Breast cancer Paternal Grandmother 44   Breast cancer Paternal Aunt     Past Surgical History:  Procedure Laterality Date   ACHILLES TENDON REPAIR  06/14/2004   CESAREAN SECTION     placenta previa   CHOLECYSTECTOMY  06/13/2001   COLONOSCOPY WITH PROPOFOL N/A 09/24/2021   Procedure: COLONOSCOPY WITH PROPOFOL;  Surgeon: Lin Landsman, MD;  Location: [place];  Service: Gastroenterology;  Laterality: N/A;   Social History   Occupational History   Not on file  Tobacco Use   Smoking status: Never   Smokeless tobacco: Never  Vaping Use   Vaping Use: Never used  Substance and Sexual Activity   Alcohol use: Yes    Comment: glass of wine occ   Drug use: Never   Sexual activity: Yes     Garald Balding, MD   Note - This record has been created using Editor, commissioning.  Chart creation errors have been sought, but may not always  have been located. Such creation errors do not reflect on  the standard of medical care.

## 2021-12-29 DIAGNOSIS — M955 Acquired deformity of pelvis: Secondary | ICD-10-CM | POA: Diagnosis not present

## 2021-12-29 DIAGNOSIS — M9905 Segmental and somatic dysfunction of pelvic region: Secondary | ICD-10-CM | POA: Diagnosis not present

## 2021-12-29 DIAGNOSIS — M9903 Segmental and somatic dysfunction of lumbar region: Secondary | ICD-10-CM | POA: Diagnosis not present

## 2021-12-29 DIAGNOSIS — M5136 Other intervertebral disc degeneration, lumbar region: Secondary | ICD-10-CM | POA: Diagnosis not present

## 2022-01-13 ENCOUNTER — Ambulatory Visit: Payer: Medicare Other | Admitting: Gastroenterology

## 2022-02-09 ENCOUNTER — Ambulatory Visit (INDEPENDENT_AMBULATORY_CARE_PROVIDER_SITE_OTHER): Payer: Medicare Other

## 2022-02-09 VITALS — Wt 160.0 lb

## 2022-02-09 DIAGNOSIS — Z0000 Encounter for general adult medical examination without abnormal findings: Secondary | ICD-10-CM

## 2022-02-09 NOTE — Patient Instructions (Signed)
Natalie Sosa , Thank you for taking time to come for your Medicare Wellness Visit. I appreciate your ongoing commitment to your health goals. Please review the following plan we discussed and let me know if I can assist you in the future.   These are the goals we discussed:  Goals      Increase physical activity     Starting 01/19/2018, I will continue to play tennis for at least 90 minutes 3-4 days per week.      Patient Stated     01/28/2019, I will maintain and continue medications as prescribed.      Patient Stated     02/07/2020, I will continue to play tennis 3 days a week for 1 1/2 hours.     Patient Stated     Would like to maintain current routine     Patient Stated     Lose weight         This is a list of the screening recommended for you and due dates:  Health Maintenance  Topic Date Due   DTaP/Tdap/Td vaccine (2 - Tdap) 10/14/2015   COVID-19 Vaccine (7 - 2023-24 season) 12/20/2021   Mammogram  04/01/2022   Medicare Annual Wellness Visit  02/10/2023   Colon Cancer Screening  09/25/2026   Pneumonia Vaccine  Completed   Flu Shot  Completed   DEXA scan (bone density measurement)  Completed   Hepatitis C Screening: USPSTF Recommendation to screen - Ages 26-79 yo.  Completed   Zoster (Shingles) Vaccine  Completed   HPV Vaccine  Aged Out    Advanced directives: copies in chart   Conditions/risks identified: lose a little weight  Next appointment: Follow up in one year for your annual wellness visit    Preventive Care 65 Years and Older, Female Preventive care refers to lifestyle choices and visits with your health care provider that can promote health and wellness. What does preventive care include? A yearly physical exam. This is also called an annual well check. Dental exams once or twice a year. Routine eye exams. Ask your health care provider how often you should have your eyes checked. Personal lifestyle choices, including: Daily care of your teeth and  gums. Regular physical activity. Eating a healthy diet. Avoiding tobacco and drug use. Limiting alcohol use. Practicing safe sex. Taking low-dose aspirin every day. Taking vitamin and mineral supplements as recommended by your health care provider. What happens during an annual well check? The services and screenings done by your health care provider during your annual well check will depend on your age, overall health, lifestyle risk factors, and family history of disease. Counseling  Your health care provider may ask you questions about your: Alcohol use. Tobacco use. Drug use. Emotional well-being. Home and relationship well-being. Sexual activity. Eating habits. History of falls. Memory and ability to understand (cognition). Work and work Statistician. Reproductive health. Screening  You may have the following tests or measurements: Height, weight, and BMI. Blood pressure. Lipid and cholesterol levels. These may be checked every 5 years, or more frequently if you are over 28 years old. Skin check. Lung cancer screening. You may have this screening every year starting at age 13 if you have a 30-pack-year history of smoking and currently smoke or have quit within the past 15 years. Fecal occult blood test (FOBT) of the stool. You may have this test every year starting at age 5. Flexible sigmoidoscopy or colonoscopy. You may have a sigmoidoscopy every 5 years or  a colonoscopy every 10 years starting at age 60. Hepatitis C blood test. Hepatitis B blood test. Sexually transmitted disease (STD) testing. Diabetes screening. This is done by checking your blood sugar (glucose) after you have not eaten for a while (fasting). You may have this done every 1-3 years. Bone density scan. This is done to screen for osteoporosis. You may have this done starting at age 26. Mammogram. This may be done every 1-2 years. Talk to your health care provider about how often you should have regular  mammograms. Talk with your health care provider about your test results, treatment options, and if necessary, the need for more tests. Vaccines  Your health care provider may recommend certain vaccines, such as: Influenza vaccine. This is recommended every year. Tetanus, diphtheria, and acellular pertussis (Tdap, Td) vaccine. You may need a Td booster every 10 years. Zoster vaccine. You may need this after age 41. Pneumococcal 13-valent conjugate (PCV13) vaccine. One dose is recommended after age 17. Pneumococcal polysaccharide (PPSV23) vaccine. One dose is recommended after age 18. Talk to your health care provider about which screenings and vaccines you need and how often you need them. This information is not intended to replace advice given to you by your health care provider. Make sure you discuss any questions you have with your health care provider. Document Released: 02/06/2015 Document Revised: 09/30/2015 Document Reviewed: 11/11/2014 Elsevier Interactive Patient Education  2017 [employer] Prevention in the Home Falls can cause injuries. They can happen to people of all ages. There are many things you can do to make your home safe and to help prevent falls. What can I do on the outside of my home? Regularly fix the edges of walkways and driveways and fix any cracks. Remove anything that might make you trip as you walk through a door, such as a raised step or threshold. Trim any bushes or trees on the path to your home. Use bright outdoor lighting. Clear any walking paths of anything that might make someone trip, such as rocks or tools. Regularly check to see if handrails are loose or broken. Make sure that both sides of any steps have handrails. Any raised decks and porches should have guardrails on the edges. Have any leaves, snow, or ice cleared regularly. Use sand or salt on walking paths during winter. Clean up any spills in your garage right away. This includes oil  or grease spills. What can I do in the bathroom? Use night lights. Install grab bars by the toilet and in the tub and shower. Do not use towel bars as grab bars. Use non-skid mats or decals in the tub or shower. If you need to sit down in the shower, use a plastic, non-slip stool. Keep the floor dry. Clean up any water that spills on the floor as soon as it happens. Remove soap buildup in the tub or shower regularly. Attach bath mats securely with double-sided non-slip rug tape. Do not have throw rugs and other things on the floor that can make you trip. What can I do in the bedroom? Use night lights. Make sure that you have a light by your bed that is easy to reach. Do not use any sheets or blankets that are too big for your bed. They should not hang down onto the floor. Have a firm chair that has side arms. You can use this for support while you get dressed. Do not have throw rugs and other things on the floor that  can make you trip. What can I do in the kitchen? Clean up any spills right away. Avoid walking on wet floors. Keep items that you use a lot in easy-to-reach places. If you need to reach something above you, use a strong step stool that has a grab bar. Keep electrical cords out of the way. Do not use floor polish or wax that makes floors slippery. If you must use wax, use non-skid floor wax. Do not have throw rugs and other things on the floor that can make you trip. What can I do with my stairs? Do not leave any items on the stairs. Make sure that there are handrails on both sides of the stairs and use them. Fix handrails that are broken or loose. Make sure that handrails are as long as the stairways. Check any carpeting to make sure that it is firmly attached to the stairs. Fix any carpet that is loose or worn. Avoid having throw rugs at the top or bottom of the stairs. If you do have throw rugs, attach them to the floor with carpet tape. Make sure that you have a light  switch at the top of the stairs and the bottom of the stairs. If you do not have them, ask someone to add them for you. What else can I do to help prevent falls? Wear shoes that: Do not have high heels. Have rubber bottoms. Are comfortable and fit you well. Are closed at the toe. Do not wear sandals. If you use a stepladder: Make sure that it is fully opened. Do not climb a closed stepladder. Make sure that both sides of the stepladder are locked into place. Ask someone to hold it for you, if possible. Clearly mark and make sure that you can see: Any grab bars or handrails. First and last steps. Where the edge of each step is. Use tools that help you move around (mobility aids) if they are needed. These include: Canes. Walkers. Scooters. Crutches. Turn on the lights when you go into a dark area. Replace any light bulbs as soon as they burn out. Set up your furniture so you have a clear path. Avoid moving your furniture around. If any of your floors are uneven, fix them. If there are any pets around you, be aware of where they are. Review your medicines with your doctor. Some medicines can make you feel dizzy. This can increase your chance of falling. Ask your doctor what other things that you can do to help prevent falls. This information is not intended to replace advice given to you by your health care provider. Make sure you discuss any questions you have with your health care provider. Document Released: 11/06/2008 Document Revised: 06/18/2015 Document Reviewed: 02/14/2014 Elsevier Interactive Patient Education  2017 [employer].

## 2022-02-09 NOTE — Progress Notes (Signed)
I connected with  Natalie Sosa on 02/09/22 by a audio enabled telemedicine application and verified that I am speaking with the correct person using two identifiers.  Patient Location: Home  Provider Location: Home Office  I discussed the limitations of evaluation and management by telemedicine. The patient expressed understanding and agreed to proceed.   Subjective:   Natalie Sosa is a 77 y.o. female who presents for Medicare Annual (Subsequent) preventive examination.  Review of Systems     Cardiac Risk Factors include: advanced age (>80mn, >>24women);dyslipidemia     Objective:    Today's Vitals   02/09/22 1447  Weight: 160 lb (72.6 kg)   Body mass index is 25.82 kg/m.     02/09/2022    2:51 PM 09/24/2021    7:05 AM 02/08/2021    2:46 PM 12/16/2020    4:04 PM 02/07/2020    2:47 PM 01/28/2019    2:47 PM 01/19/2018   11:38 AM  Advanced Directives  Does Patient Have a Medical Advance Directive? Yes Yes Yes Yes Yes Yes Yes  Type of AParamedicof [place]Living will [place]rew[place] HillLiving will [place]Living will  HPuebl[place]Living will [place] Cyg[place]Living will HTrinityLiving will  Does patient want to make changes to medical advance directive? No - Patient declined  Yes (MAU/Ambulatory/Procedural Areas - Information given) No - Patient declined     Copy of HEmporiain Chart? Yes - validated most recent copy scanned in chart (See row information)  Yes - validated most recent copy scanned in chart (See row information)  Yes - validated most recent copy scanned in chart (See row information) Yes - validated most recent copy scanned in chart (See row information) No - copy requested    Current Medications (verified) Outpatient Encounter Medications as of 02/09/2022  Medication Sig   CALCIUM-VITAMIN D PO Take 2 tablets by mouth daily.   ibuprofen  (ADVIL,MOTRIN) 200 MG tablet Use as directed   Multiple Vitamin (MULTIVITAMIN) tablet Take 1 tablet by mouth daily.   simvastatin (ZOCOR) 20 MG tablet TAKE 1 TABLET(20 MG) BY MOUTH AT BEDTIME FOR CHOLESTEROL   No facility-administered encounter medications on file as of 02/09/2022.    Allergies (verified) Ezetimibe-simvastatin, Hydrocodone, and Prednisone   History: Past Medical History:  Diagnosis Date   Cancer ([place]    skin   COVID-19 12/31/2020   ENDOMETRIOSIS 10/04/2006   Qualifier: History of  By: SCouncil MechanicMD, RHilaria Ota   Past Surgical History:  Procedure Laterality Date   ACHILLES TENDON REPAIR  06/14/2004   CESAREAN SECTION     placenta previa   CHOLECYSTECTOMY  06/13/2001   COLONOSCOPY WITH PROPOFOL N/A 09/24/2021   Procedure: COLONOSCOPY WITH PROPOFOL;  Surgeon: VLin Landsman MD;  [facility]  Service: Gastroenterology;  Laterality: N/A;   Family History  Problem Relation Age of Onset   Heart disease Mother    Dementia Mother    Cancer Father        Pancreatic   Stroke Father    Breast cancer Paternal Grandmother [phone]  Breast cancer Paternal Aunt    Social History   Socioeconomic History   Marital status: Married    Spouse name: Not on file   Number of children: 1   Years of education: Not on file   Highest education level: Not on file  Occupational History   Not on file  Tobacco Use   Smoking status: Never   Smokeless tobacco: Never  Vaping Use   Vaping Use: Never used  Substance and Sexual Activity   Alcohol use: Yes    Comment: glass of wine occ   Drug use: Never   Sexual activity: Yes  Other Topics Concern   Not on file  Social History Narrative   Husband Gershon Mussel, aware of her end of life wishes.  She would desire CPR, no feedings tubes.   Social Determinants of Health   Financial Resource Strain: Low Risk  (02/08/2021)   Overall Financial Resource Strain (CARDIA)    Difficulty of Paying Living Expenses: Not hard at all   Food Insecurity: No Food Insecurity (02/08/2021)   Hunger Vital Sign    Worried About Running Out of Food in the Last Year: Never true    Ran Out of Food in the Last Year: Never true  Transportation Needs: No Transportation Needs (02/08/2021)   PRAPARE - Hydrologist (Medical): No    Lack of Transportation (Non-Medical): No  Physical Activity: Sufficiently Active (02/08/2021)   Exercise Vital Sign    Days of Exercise per Week: 3 days    Minutes of Exercise per Session: 120 min  Stress: No Stress Concern Present (02/08/2021)   [place]    Feeling of Stress : Not at all  Social Connections: Unknown (02/02/2022)   Social Connection and Isolation Panel [NHANES]    Frequency of Communication with Friends and Family: Not on file    Frequency of Social Gatherings with Friends and Family: Not on file    Attends Religious Services: Never    Marine scientist or Organizations: Not on file    Attends Archivist Meetings: Not on file    Marital Status: Not on file    Tobacco Counseling Counseling given: Not Answered   Clinical Intake:  Pre-visit preparation completed: Yes  Pain : No/denies pain     BMI - recorded: 25.82 Nutritional Status: BMI 25 -29 Overweight Nutritional Risks: None Diabetes: No  How often do you need to have someone help you when you read instructions, pamphlets, or other written materials from your doctor or pharmacy?: 1 - Never  Diabetic?no  Interpreter Needed?: No  Information entered by :: Charlott Rakes, LPN   Activities of Daily Living    02/02/2022   11:54 AM  In your present state of health, do you have any difficulty performing the following activities:  Hearing? 0  Vision? 0  Difficulty concentrating or making decisions? 0  Walking or climbing stairs? 0  Dressing or bathing? 0  Doing errands, shopping? 0  Preparing Food and eating ?  N  Using the Toilet? N  In the past six months, have you accidently leaked urine? N  Do you have problems with loss of bowel control? N  Managing your Medications? N  Managing your Finances? N  Housekeeping or managing your Housekeeping? N    Patient Care Team: Pleas Koch, NP as PCP - General (Internal Medicine) Regal, Tamala Fothergill, DPM as Consulting Physician (Podiatry) Leandrew Koyanagi, MD as Referring Physician (Ophthalmology) Natalie Rack, MD as Consulting Physician (Dermatology) Mardene Celeste., MD as Consulting Physician (Dentistry)  Indicate any recent Medical Services you may have received from other than [facility] providers in the past year (date may be approximate).     Assessment:   This is a routine wellness examination  for [place].  Hearing/Vision screen Hearing Screening - Comments:: Pt denies any hearing issues  Vision Screening - Comments:: Pt follows up with Dr Wallace Going for annual eye exams   Dietary issues and exercise activities discussed: Current Exercise Habits: Home exercise routine, Type of exercise: Other - see comments (tennis), Time (Minutes): > 60, Frequency (Times/Week): 4, Weekly Exercise (Minutes/Week): 0   Goals Addressed             This Visit's Progress    Patient Stated       Lose weight        Depression Screen    02/09/2022    2:50 PM 02/08/2021    2:48 PM 02/05/2021   11:38 AM 02/07/2020    2:48 PM 02/03/2020    7:40 AM 01/28/2019    2:48 PM 01/19/2018   11:37 AM  PHQ 2/9 Scores  PHQ - 2 Score 0 0 0 0 2 0 0  PHQ- 9 Score   0 0 3 0 0    Fall Risk    02/02/2022   11:54 AM 02/08/2021    2:47 PM 02/05/2021   11:38 AM 02/07/2020    2:48 PM 02/03/2020    7:40 AM  Fall Risk   Falls in the past year? 0 0 0 0 0  Number falls in past yr: 0 0 0 0   Injury with Fall? 0 0 0 0 0  Risk for fall due to : Impaired vision No Fall Risks  No Fall Risks   Follow up Falls prevention discussed Falls prevention discussed  Falls  evaluation completed;Falls prevention discussed     FALL RISK PREVENTION PERTAINING TO THE HOME:  Any stairs in or around the home? No  If so, are there any without handrails? No  Home free of loose throw rugs in walkways, pet beds, electrical cords, etc? Yes  Adequate lighting in your home to reduce risk of falls? Yes   ASSISTIVE DEVICES UTILIZED TO PREVENT FALLS:  Life alert? No  Use of a cane, walker or w/c? No  Grab bars in the bathroom? No  Shower chair or bench in shower? Yes  Elevated toilet seat or a handicapped toilet? No   TIMED UP AND GO:  Was the test performed? No .   Cognitive Function:    02/07/2020    2:50 PM 01/28/2019    2:50 PM 01/19/2018   11:37 AM 01/13/2017    8:14 AM 01/07/2016    8:40 AM  MMSE - Mini Mental State Exam  Orientation to time '5 5 5 5 5  '$ Orientation to Place '5 5 5 5 5  '$ Registration '3 3 3 3 3  '$ Attention/ Calculation 5 5 0 0 0  Recall '3 3 3 3 3  '$ Language- name 2 objects   0 0 0  Language- repeat '1 1 1 1 1  '$ Language- follow 3 step command   '3 3 3  '$ Language- read & follow direction   0 0 0  Write a sentence   0 0 0  Copy design   0 0 0  Total score   '20 20 20        '$ 02/09/2022    2:53 PM  6CIT Screen  What Year? 0 points  What month? 0 points  What time? 0 points  Count back from 20 0 points  Months in reverse 0 points  Repeat phrase 0 points  Total Score 0 points    Immunizations Immunization History  Administered Date(s) Administered   Fluad Quad(high Dose 65+) 10/25/2018, 10/08/2019   Influenza Split 12/29/2010   Influenza Whole 11/05/2007, 11/09/2009   Influenza, High Dose Seasonal PF 10/02/2016   Influenza,inj,Quad PF,6+ Mos 10/04/2017   Influenza-Unspecified 10/24/2012, 10/07/2013, 10/21/2015, 10/12/2020, 10/25/2021   PFIZER(Purple Top)SARS-COV-2 Vaccination 02/16/2019, 03/09/2019, 10/24/2019, 04/23/2020, 10/12/2020   Pneumococcal Conjugate-13 01/01/2014   Pneumococcal Polysaccharide-23 11/11/2010   Rsv, Bivalent,  Protein Subunit Rsvpref,pf Evans Lance) 09/28/2021   Td 10/13/2005   Unspecified SARS-COV-2 Vaccination 10/25/2021   Zoster Recombinat (Shingrix) 11/29/2018, 02/04/2019   Zoster, Live 03/20/2007    TDAP status: Due, Education has been provided regarding the importance of this vaccine. Advised may receive this vaccine at local pharmacy or Health Dept. Aware to provide a copy of the vaccination record if obtained from local pharmacy or Health Dept. Verbalized acceptance and understanding.  Flu Vaccine status: Up to date  Pneumococcal vaccine status: Up to date  Covid-19 vaccine status: Completed vaccines  Qualifies for Shingles Vaccine? Yes   Zostavax completed Yes   Shingrix Completed?: Yes  Screening Tests Health Maintenance  Topic Date Due   DTaP/Tdap/Td (2 - Tdap) 10/14/2015   COVID-19 Vaccine (7 - 2023-24 season) 12/20/2021   MAMMOGRAM  04/01/2022   Medicare Annual Wellness (AWV)  02/10/2023   COLONOSCOPY (Pts 45-10yr Insurance coverage will need to be confirmed)  09/25/2026   Pneumonia Vaccine 77 Years old  Completed   INFLUENZA VACCINE  Completed   DEXA SCAN  Completed   Hepatitis C Screening  Completed   Zoster Vaccines- Shingrix  Completed   HPV VACCINES  Aged Out    Health Maintenance  Health Maintenance Due  Topic Date Due   DTaP/Tdap/Td (2 - Tdap) 10/14/2015   COVID-19 Vaccine (7 - 2023-24 season) 12/20/2021    Colorectal cancer screening: Type of screening: Colonoscopy. Completed 09/24/21. Repeat every 5 years  Mammogram status: Completed 03/31/21. Repeat every year  Bone Density status: Completed 03/31/21. Results reflect: Bone density results: OSTEOPENIA. Repeat every 2 years.  Additional Screening:  Hepatitis C Screening:  Completed 01/07/16  Vision Screening: Recommended annual ophthalmology exams for early detection of glaucoma and other disorders of the eye. Is the patient up to date with their annual eye exam?  Yes  Who is the provider or what is the  name of the office in which the patient attends annual eye exams? Dr BWallace Going If pt is not established with a provider, would they like to be referred to a provider to establish care? No .   Dental Screening: Recommended annual dental exams for proper oral hygiene  Community Resource Referral / Chronic Care Management: CRR required this visit?  No   CCM required this visit?  No      Plan:     I have personally reviewed and noted the following in the patient's chart:   Medical and social history Use of alcohol, tobacco or illicit drugs  Current medications and supplements including opioid prescriptions. Patient is not currently taking opioid prescriptions. Functional ability and status Nutritional status Physical activity Advanced directives List of other physicians Hospitalizations, surgeries, and ER visits in previous 12 months Vitals Screenings to include cognitive, depression, and falls Referrals and appointments  In addition, I have reviewed and discussed with patient certain preventive protocols, quality metrics, and best practice recommendations. A written personalized care plan for preventive services as well as general preventive health recommendations were provided to patient.     TWillette Brace LPN   12/21/7865  Nurse Notes: none

## 2022-02-17 ENCOUNTER — Encounter: Payer: Self-pay | Admitting: Primary Care

## 2022-02-17 ENCOUNTER — Ambulatory Visit (INDEPENDENT_AMBULATORY_CARE_PROVIDER_SITE_OTHER): Payer: Medicare Other | Admitting: Primary Care

## 2022-02-17 VITALS — BP 142/80 | HR 75 | Temp 97.2°F | Ht 66.0 in | Wt 163.0 lb

## 2022-02-17 DIAGNOSIS — M159 Polyosteoarthritis, unspecified: Secondary | ICD-10-CM | POA: Diagnosis not present

## 2022-02-17 DIAGNOSIS — Z0000 Encounter for general adult medical examination without abnormal findings: Secondary | ICD-10-CM | POA: Diagnosis not present

## 2022-02-17 DIAGNOSIS — G4700 Insomnia, unspecified: Secondary | ICD-10-CM

## 2022-02-17 DIAGNOSIS — Z0001 Encounter for general adult medical examination with abnormal findings: Secondary | ICD-10-CM | POA: Insufficient documentation

## 2022-02-17 DIAGNOSIS — E7800 Pure hypercholesterolemia, unspecified: Secondary | ICD-10-CM | POA: Diagnosis not present

## 2022-02-17 DIAGNOSIS — M1990 Unspecified osteoarthritis, unspecified site: Secondary | ICD-10-CM | POA: Insufficient documentation

## 2022-02-17 DIAGNOSIS — Z1231 Encounter for screening mammogram for malignant neoplasm of breast: Secondary | ICD-10-CM

## 2022-02-17 LAB — COMPREHENSIVE METABOLIC PANEL
ALT: 15 U/L (ref 0–35)
AST: 21 U/L (ref 0–37)
Albumin: 4.4 g/dL (ref 3.5–5.2)
Alkaline Phosphatase: 58 U/L (ref 39–117)
BUN: 26 mg/dL — ABNORMAL HIGH (ref 6–23)
CO2: 27 mEq/L (ref 19–32)
Calcium: 9.4 mg/dL (ref 8.4–10.5)
Chloride: 104 mEq/L (ref 96–112)
Creatinine, Ser: 0.77 mg/dL (ref 0.40–1.20)
GFR: 74.93 mL/min (ref >60.00)
Glucose, Bld: 101 mg/dL — ABNORMAL HIGH (ref 70–99)
Potassium: 4.1 mEq/L (ref 3.5–5.1)
Sodium: 140 mEq/L (ref 135–145)
Total Bilirubin: 0.6 mg/dL (ref 0.2–1.2)
Total Protein: 6.9 g/dL (ref 6.0–8.3)

## 2022-02-17 LAB — LIPID PANEL
Cholesterol: 171 mg/dL (ref 0–200)
HDL: 65.7 mg/dL (ref >39.00)
LDL Cholesterol: 83 mg/dL (ref 0–99)
NonHDL: 104.81
Total CHOL/HDL Ratio: 3
Triglycerides: 110 mg/dL (ref 0.0–149.0)
VLDL: 22 mg/dL (ref 0.0–40.0)

## 2022-02-17 NOTE — Patient Instructions (Signed)
Stop by the lab prior to leaving today. I will notify you of your results once received.   [facility] schedule your [facility].   It was a pleasure to see you today!   

## 2022-02-17 NOTE — Assessment & Plan Note (Signed)
Repeat lipid panel pending. Continue simvastatin 20 mg daily.

## 2022-02-17 NOTE — Assessment & Plan Note (Signed)
Chronic and continued. No concerns today.  Continue to monitor.

## 2022-02-17 NOTE — Assessment & Plan Note (Signed)
Immunizations UTD. Mammogram due in March, orders placed. Bone density scan UTD. Colonoscopy UTD, due 2028  Discussed the importance of a healthy diet and regular exercise in order for weight loss, and to reduce the risk of further co-morbidity.  Exam stable. Labs pending.  Follow up in 1 year for repeat physical.

## 2022-02-17 NOTE — Assessment & Plan Note (Signed)
Chronic.  Commended her on regular physical activity. Discussed use of Tylenol and glucosamine with chondroitin.

## 2022-02-17 NOTE — Progress Notes (Signed)
Subjective:    Patient ID: Natalie Sosa, female    DOB: 06-21-1945, 77 y.o.   MRN: [id number]  HPI  Natalie Sosa is a very pleasant 77 y.o. female who presents today for complete physical and follow up of chronic conditions.  Immunizations: -Tetanus: Completed in 2007 -Influenza: Completed this season -Shingles: Completed Shingrix series -Pneumonia: Completed Prevnar 13 in 2015 and pneumovax 23 in 2012  Diet: [place].  Exercise: No regular exercise.  Eye exam: Completes annually  Dental exam: Completes semi-annually   Mammogram: Completed in March 2023  Colonoscopy: Completed in 2023, due 2028 Dexa: Completed in March 2023  BP Readings from Last 3 Encounters:  02/17/22 (!) 142/80  09/24/21 121/68  09/01/21 120/62      Review of Systems  Constitutional:  Negative for unexpected weight change.  HENT:  Negative for rhinorrhea.   Respiratory:  Negative for cough and shortness of breath.   Cardiovascular:  Negative for chest pain.  Gastrointestinal:  Negative for constipation and diarrhea.  Genitourinary:  Negative for difficulty urinating.  Musculoskeletal:  Positive for arthralgias.  Skin:  Negative for rash.  Allergic/Immunologic: Negative for environmental allergies.  Neurological:  Negative for dizziness and headaches.  Psychiatric/Behavioral:  The patient is not nervous/anxious.          Past Medical History:  Diagnosis Date   Cancer [facility])    skin   COVID-19 12/31/2020   ENDOMETRIOSIS 10/04/2006   Qualifier: History of  By: Council Mechanic MD, Hilaria Ota     Social History   Socioeconomic History   Marital status: Married    Spouse name: Not on file   Number of children: 1   Years of education: Not on file   Highest education level: Not on file  Occupational History   Not on file  Tobacco Use   Smoking status: Never   Smokeless tobacco: Never  Vaping Use   Vaping Use: Never used  Substance and Sexual Activity   Alcohol use: Yes    Comment:  glass of wine occ   Drug use: Never   Sexual activity: Yes  Other Topics Concern   Not on file  Social History Narrative   Husband Gershon Mussel, aware of her end of life wishes.  She would desire CPR, no feedings tubes.   Social Determinants of Health   Financial Resource Strain: Low Risk  (02/08/2021)   Overall Financial Resource Strain (CARDIA)    Difficulty of Paying Living Expenses: Not hard at all  Food Insecurity: No Food Insecurity (02/08/2021)   Hunger Vital Sign    Worried About Running Out of Food in the Last Year: Never true    Ran Out of Food in the Last Year: Never true  Transportation Needs: No Transportation Needs (02/08/2021)   PRAPARE - Hydrologist (Medical): No    Lack of Transportation (Non-Medical): No  Physical Activity: Sufficiently Active (02/08/2021)   Exercise Vital Sign    Days of Exercise per Week: 3 days    Minutes of Exercise per Session: 120 min  Stress: No Stress Concern Present (02/08/2021)   [place]    Feeling of Stress : Not at all  Social Connections: Unknown (02/02/2022)   Social Connection and Isolation Panel [NHANES]    Frequency of Communication with Friends and Family: Not on file    Frequency of Social Gatherings with Friends and Family: Not on file    Attends  Religious Services: Never    Active Member of Clubs or Organizations: Not on file    Attends Club or Organization Meetings: Not on file    Marital Status: Not on file  Intimate Partner Violence: Not At Risk (02/09/2022)   Humiliation, Afraid, Rape, and Kick questionnaire    Fear of Current or Ex-Partner: No    Emotionally Abused: No    Physically Abused: No    Sexually Abused: No    Past Surgical History:  Procedure Laterality Date   ACHILLES TENDON REPAIR  06/14/2004   CESAREAN SECTION     placenta previa   CHOLECYSTECTOMY  06/13/2001   COLONOSCOPY WITH PROPOFOL N/A 09/24/2021   Procedure:  COLONOSCOPY WITH PROPOFOL;  Surgeon: Lin Landsman, MD;  Location: [facility] ENDOSCOPY;  Service: Gastroenterology;  Laterality: N/A;    Family History  Problem Relation Age of Onset   Heart disease Mother    Dementia Mother    Cancer Father        Pancreatic   Stroke Father    Breast cancer Paternal Grandmother 52   Breast cancer Paternal Aunt     Allergies  Allergen Reactions   Ezetimibe-Simvastatin     REACTION: Achey   Hydrocodone     REACTION: Itchy, rash   Prednisone     REACTION: anxiety, hyper    Current Outpatient Medications on File Prior to Visit  Medication Sig Dispense Refill   CALCIUM-VITAMIN D PO Take 2 tablets by mouth daily.     ibuprofen (ADVIL,MOTRIN) 200 MG tablet Use as directed     Multiple Vitamin (MULTIVITAMIN) tablet Take 1 tablet by mouth daily.     simvastatin (ZOCOR) 20 MG tablet TAKE 1 TABLET(20 MG) BY MOUTH AT BEDTIME FOR CHOLESTEROL 90 tablet 0   No current facility-administered medications on file prior to visit.    BP (!) 142/80   Pulse 75   Temp (!) 97.2 F (36.2 C) (Temporal)   Ht '5\' 6"'$  (3.220 m)   Wt 163 lb (73.9 kg)   SpO2 98%   BMI 26.31 kg/m  Objective:   Physical Exam HENT:     Right Ear: Tympanic membrane and ear canal normal.     Left Ear: Tympanic membrane and ear canal normal.     Nose: Nose normal.  Eyes:     Conjunctiva/sclera: Conjunctivae normal.     Pupils: Pupils are equal, round, and reactive to light.  Neck:     Thyroid: No thyromegaly.  Cardiovascular:     Rate and Rhythm: Normal rate and regular rhythm.     Heart sounds: No murmur heard. Pulmonary:     Effort: Pulmonary effort is normal.     Breath sounds: Normal breath sounds. No rales.  Abdominal:     General: Bowel sounds are normal.     Palpations: Abdomen is soft.     Tenderness: There is no abdominal tenderness.  Musculoskeletal:        General: Normal range of motion.     Cervical back: Neck supple.  Lymphadenopathy:     Cervical: No  cervical adenopathy.  Skin:    General: Skin is warm and dry.     Findings: No rash.  Neurological:     Mental Status: She is alert and oriented to person, place, and time.     Cranial Nerves: No cranial nerve deficit.     Deep Tendon Reflexes: Reflexes are normal and symmetric.  Psychiatric:        Mood and  Affect: Mood normal.           Assessment & Plan:  Preventative health care Assessment & Plan: Immunizations UTD. Mammogram due in March, orders placed. Bone density scan UTD. Colonoscopy UTD, due 2028  Discussed the importance of a healthy diet and regular exercise in order for weight loss, and to reduce the risk of further co-morbidity.  Exam stable. Labs pending.  Follow up in 1 year for repeat physical.    Insomnia, unspecified type Assessment & Plan: Chronic and continued. No concerns today.  Continue to monitor.   HYPERCHOLESTEROLEMIA Assessment & Plan: Repeat lipid panel pending. Continue simvastatin 20 mg daily.   Orders: -     Lipid panel -     Comprehensive metabolic panel  Primary osteoarthritis involving multiple joints Assessment & Plan: Chronic.  Commended her on regular physical activity. Discussed use of Tylenol and glucosamine with chondroitin.     Screening mammogram for breast cancer -     3D Screening Mammogram, Left and Right; Future        Pleas Koch, NP

## 2022-03-22 DIAGNOSIS — H40033 Anatomical narrow angle, bilateral: Secondary | ICD-10-CM | POA: Diagnosis not present

## 2022-03-22 DIAGNOSIS — H40003 Preglaucoma, unspecified, bilateral: Secondary | ICD-10-CM | POA: Diagnosis not present

## 2022-03-22 DIAGNOSIS — H2513 Age-related nuclear cataract, bilateral: Secondary | ICD-10-CM | POA: Diagnosis not present

## 2022-03-26 ENCOUNTER — Other Ambulatory Visit: Payer: Self-pay | Admitting: Primary Care

## 2022-03-26 DIAGNOSIS — E7800 Pure hypercholesterolemia, unspecified: Secondary | ICD-10-CM

## 2022-04-04 ENCOUNTER — Ambulatory Visit
Admission: RE | Admit: 2022-04-04 | Discharge: 2022-04-04 | Disposition: A | Discharge status: Home / Self Care | Payer: Medicare Other | Source: Ambulatory Visit | Attending: Primary Care | Admitting: Primary Care

## 2022-04-04 DIAGNOSIS — Z1231 Encounter for screening mammogram for malignant neoplasm of breast: Secondary | ICD-10-CM | POA: Insufficient documentation

## 2022-04-13 ENCOUNTER — Ambulatory Visit: Payer: Medicare Other | Admitting: Orthopaedic Surgery

## 2022-04-13 ENCOUNTER — Encounter: Payer: Self-pay | Admitting: Orthopaedic Surgery

## 2022-04-13 DIAGNOSIS — M7662 Achilles tendinitis, left leg: Secondary | ICD-10-CM | POA: Diagnosis not present

## 2022-04-13 DIAGNOSIS — M1711 Unilateral primary osteoarthritis, right knee: Secondary | ICD-10-CM | POA: Diagnosis not present

## 2022-04-13 DIAGNOSIS — G8929 Other chronic pain: Secondary | ICD-10-CM

## 2022-04-13 DIAGNOSIS — M25561 Pain in right knee: Secondary | ICD-10-CM

## 2022-04-13 MED ORDER — LIDOCAINE HCL 1 % IJ SOLN
3.0000 mL | INTRAMUSCULAR | Status: AC | PRN
  Administered 2022-04-13: 3 mL

## 2022-04-13 MED ORDER — METHYLPREDNISOLONE ACETATE 40 MG/ML IJ SUSP
40.0000 mg | INTRAMUSCULAR | Status: AC | PRN
  Administered 2022-04-13: 40 mg via INTRA_ARTICULAR

## 2022-04-13 NOTE — Progress Notes (Signed)
The patient is a 77 year old female that I am seeing for the first time but she has been a long-term patient of Dr. Durward Fortes.  She is an avid Firefighter.  She is very active and comes in from time to time for steroid injection in her right knee to treat the pain from osteoarthritis.  She last had a steroid injection over 3 months ago and that is helped.  She does wear knee brace when she plays tennis.  She has a history of right Achilles tendon surgery by my partner Dr. Marlou Sa.  Her left Achilles has been bothering her recently.  On examination of her right knee there is slight valgus malalignment of the knee.  She has good range of motion overall and some lateral joint line tenderness.  There is no effusion.  The knee feels stable ligamentously.  Examination of her left ankle shows some pain over the Achilles near its insertion and just lateral.  Her [place] Silos test is negative and the Achilles is intact.  I talked her about trying a Thera-Band for her Achilles tendon and Voltaren gel.  She may end up needing physical therapy if it bothers her enough and she will let us know.  She did request a steroid injection in her right knee today and I agree with this as well given her level of comfort and playing.  We did place a steroid injection in her right knee without difficulty.  All questions and concerns were answered and addressed.  Follow-up is as needed.  If things worsen she knows to let us know.      Procedure Note  Patient: KYNIAH LUSH             Date of Birth: [birth year]           MRN: [id number]             Visit Date: 04/13/2022  Procedures: Visit Diagnoses:  1. Unilateral primary osteoarthritis, right knee   2. Chronic pain of right knee   3. Tendonitis, Achilles, left     Large Joint Inj: R knee on 04/13/2022 8:34 AM Indications: diagnostic evaluation and pain Details: 22 G 1.5 in needle, superolateral approach  Arthrogram: No  Medications: 3 mL lidocaine 1 %; 40 mg  methylPREDNISolone acetate 40 MG/ML Outcome: tolerated well, no immediate complications Procedure, treatment alternatives, risks and benefits explained, specific risks discussed. Consent was given by the patient. Immediately prior to procedure a time out was called to verify the correct patient, procedure, equipment, support staff and site/side marked as required. Patient was prepped and draped in the usual sterile fashion.

## 2022-06-27 DIAGNOSIS — M955 Acquired deformity of pelvis: Secondary | ICD-10-CM | POA: Diagnosis not present

## 2022-06-27 DIAGNOSIS — M5136 Other intervertebral disc degeneration, lumbar region: Secondary | ICD-10-CM | POA: Diagnosis not present

## 2022-06-27 DIAGNOSIS — M9903 Segmental and somatic dysfunction of lumbar region: Secondary | ICD-10-CM | POA: Diagnosis not present

## 2022-06-27 DIAGNOSIS — M9905 Segmental and somatic dysfunction of pelvic region: Secondary | ICD-10-CM | POA: Diagnosis not present

## 2022-07-18 DIAGNOSIS — M955 Acquired deformity of pelvis: Secondary | ICD-10-CM | POA: Diagnosis not present

## 2022-07-18 DIAGNOSIS — M9903 Segmental and somatic dysfunction of lumbar region: Secondary | ICD-10-CM | POA: Diagnosis not present

## 2022-07-18 DIAGNOSIS — M9905 Segmental and somatic dysfunction of pelvic region: Secondary | ICD-10-CM | POA: Diagnosis not present

## 2022-07-18 DIAGNOSIS — M5136 Other intervertebral disc degeneration, lumbar region: Secondary | ICD-10-CM | POA: Diagnosis not present

## 2022-08-02 DIAGNOSIS — K080 Exfoliation of teeth due to systemic causes: Secondary | ICD-10-CM | POA: Diagnosis not present

## 2022-08-08 DIAGNOSIS — M955 Acquired deformity of pelvis: Secondary | ICD-10-CM | POA: Diagnosis not present

## 2022-08-08 DIAGNOSIS — M9905 Segmental and somatic dysfunction of pelvic region: Secondary | ICD-10-CM | POA: Diagnosis not present

## 2022-08-08 DIAGNOSIS — M5136 Other intervertebral disc degeneration, lumbar region: Secondary | ICD-10-CM | POA: Diagnosis not present

## 2022-08-08 DIAGNOSIS — M9903 Segmental and somatic dysfunction of lumbar region: Secondary | ICD-10-CM | POA: Diagnosis not present

## 2022-08-18 ENCOUNTER — Ambulatory Visit: Payer: Medicare Other | Admitting: Orthopaedic Surgery

## 2022-08-18 DIAGNOSIS — G8929 Other chronic pain: Secondary | ICD-10-CM | POA: Diagnosis not present

## 2022-08-18 DIAGNOSIS — M25561 Pain in right knee: Secondary | ICD-10-CM | POA: Diagnosis not present

## 2022-08-18 MED ORDER — LIDOCAINE HCL 1 % IJ SOLN
3.0000 mL | INTRAMUSCULAR | Status: AC | PRN
  Administered 2022-08-18: 3 mL

## 2022-08-18 MED ORDER — METHYLPREDNISOLONE ACETATE 40 MG/ML IJ SUSP
40.0000 mg | INTRAMUSCULAR | Status: AC | PRN
  Administered 2022-08-18: 40 mg via INTRA_ARTICULAR

## 2022-08-18 NOTE — Progress Notes (Signed)
The patient is well-known to me.  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  She comes in today requesting a steroid injection of her right knee.  It has been 4 months since we injected her knee before she has known tricompartment arthritis of that knee.  She says she is nowhere near needing a knee replacement yet.  She did have me look at her left shoulder.  It has been hurting her on just a little recently.  Her range of motion is full and her rotator cuff is strong with that shoulder.  Her right knee shows medial joint line tenderness but no effusion today with good range of motion.  Of note her left Achilles that have seen her before is pain-free except for when she plays tennis.  I shown her some exercises to try for her Achilles and her left shoulder.  I did place a steroid injection in her right knee per her request.  All questions and concerns were answered and addressed.  Follow-up is as needed.  She knows to wait at least 3 to 4 months between steroid injections.    Procedure Note  Patient: Natalie Sosa             Date of Birth: September 24, 1945           MRN: [id number]             Visit Date: 08/18/2022  Procedures: Visit Diagnoses:  1. Chronic pain of right knee     Large Joint Inj: R knee on 08/18/2022 3:02 PM Indications: diagnostic evaluation and pain Details: 22 G 1.5 in needle, superolateral approach  Arthrogram: No  Medications: 3 mL lidocaine 1 %; 40 mg methylPREDNISolone acetate 40 MG/ML Outcome: tolerated well, no immediate complications Procedure, treatment alternatives, risks and benefits explained, specific risks discussed. Consent was given by the patient. Immediately prior to procedure a time out was called to verify the correct patient, procedure, equipment, support staff and site/side marked as required. Patient was prepped and draped in the usual sterile fashion.

## 2022-09-05 DIAGNOSIS — M9903 Segmental and somatic dysfunction of lumbar region: Secondary | ICD-10-CM | POA: Diagnosis not present

## 2022-09-05 DIAGNOSIS — M955 Acquired deformity of pelvis: Secondary | ICD-10-CM | POA: Diagnosis not present

## 2022-09-05 DIAGNOSIS — M5136 Other intervertebral disc degeneration, lumbar region: Secondary | ICD-10-CM | POA: Diagnosis not present

## 2022-09-05 DIAGNOSIS — M9905 Segmental and somatic dysfunction of pelvic region: Secondary | ICD-10-CM | POA: Diagnosis not present

## 2022-09-20 DIAGNOSIS — H40003 Preglaucoma, unspecified, bilateral: Secondary | ICD-10-CM | POA: Diagnosis not present

## 2022-09-29 DIAGNOSIS — D2261 Melanocytic nevi of right upper limb, including shoulder: Secondary | ICD-10-CM | POA: Diagnosis not present

## 2022-09-29 DIAGNOSIS — Z85828 Personal history of other malignant neoplasm of skin: Secondary | ICD-10-CM | POA: Diagnosis not present

## 2022-09-29 DIAGNOSIS — L853 Xerosis cutis: Secondary | ICD-10-CM | POA: Diagnosis not present

## 2022-09-29 DIAGNOSIS — D225 Melanocytic nevi of trunk: Secondary | ICD-10-CM | POA: Diagnosis not present

## 2022-09-30 DIAGNOSIS — H40003 Preglaucoma, unspecified, bilateral: Secondary | ICD-10-CM | POA: Diagnosis not present

## 2022-09-30 DIAGNOSIS — H40033 Anatomical narrow angle, bilateral: Secondary | ICD-10-CM | POA: Diagnosis not present

## 2022-09-30 DIAGNOSIS — H2513 Age-related nuclear cataract, bilateral: Secondary | ICD-10-CM | POA: Diagnosis not present

## 2022-09-30 DIAGNOSIS — Z0100 Encounter for examination of eyes and vision without abnormal findings: Secondary | ICD-10-CM | POA: Diagnosis not present

## 2022-10-12 DIAGNOSIS — H52223 Regular astigmatism, bilateral: Secondary | ICD-10-CM | POA: Diagnosis not present

## 2022-10-12 DIAGNOSIS — H2512 Age-related nuclear cataract, left eye: Secondary | ICD-10-CM | POA: Diagnosis not present

## 2022-10-17 DIAGNOSIS — H18452 Nodular corneal degeneration, left eye: Secondary | ICD-10-CM | POA: Diagnosis not present

## 2022-10-17 DIAGNOSIS — H2512 Age-related nuclear cataract, left eye: Secondary | ICD-10-CM | POA: Diagnosis not present

## 2022-10-25 DIAGNOSIS — H18452 Nodular corneal degeneration, left eye: Secondary | ICD-10-CM | POA: Diagnosis not present

## 2022-10-26 ENCOUNTER — Ambulatory Visit: Admit: 2022-10-26 | Payer: Medicare Other | Admitting: Ophthalmology

## 2022-10-26 SURGERY — PHACOEMULSIFICATION, CATARACT, WITH IOL INSERTION
Anesthesia: Topical | Laterality: Left

## 2022-10-28 DIAGNOSIS — H18452 Nodular corneal degeneration, left eye: Secondary | ICD-10-CM | POA: Diagnosis not present

## 2022-10-28 DIAGNOSIS — H2511 Age-related nuclear cataract, right eye: Secondary | ICD-10-CM | POA: Diagnosis not present

## 2022-10-31 ENCOUNTER — Encounter: Payer: Self-pay | Admitting: Ophthalmology

## 2022-11-02 NOTE — Anesthesia Preprocedure Evaluation (Addendum)
Anesthesia Evaluation  Patient identified by MRN, date of birth, ID band Patient awake    Reviewed: Allergy & Precautions, H&P , NPO status , Patient's Chart, lab work & pertinent test results  Airway Mallampati: III  TM Distance: <3 FB Neck ROM: Full    Dental no notable dental hx. (+) Caps Cap or crown right upper lateral incisor:   Pulmonary neg pulmonary ROS   Pulmonary exam normal breath sounds clear to auscultation       Cardiovascular negative cardio ROS Normal cardiovascular exam Rhythm:Regular Rate:Normal     Neuro/Psych negative neurological ROS  negative psych ROS   GI/Hepatic negative GI ROS, Neg liver ROS,,,  Endo/Other  negative endocrine ROS    Renal/GU negative Renal ROS  negative genitourinary   Musculoskeletal  (+) Arthritis ,    Abdominal   Peds negative pediatric ROS (+)  Hematology negative hematology ROS (+)   Anesthesia Other Findings Arthritis Hx covid Hx colonoscopy Sept 2023  Reproductive/Obstetrics negative OB ROS                              Anesthesia Physical Anesthesia Plan  ASA: 3  Anesthesia Plan: MAC   Post-op Pain Management:    Induction: Intravenous  PONV Risk Score and Plan:   Airway Management Planned: Natural Airway and Nasal Cannula  Additional Equipment:   Intra-op Plan:   Post-operative Plan:   Informed Consent: I have reviewed the patients History and Physical, chart, labs and discussed the procedure including the risks, benefits and alternatives for the proposed anesthesia with the patient or authorized representative who has indicated his/her understanding and acceptance.     Dental Advisory Given  Plan Discussed with: Anesthesiologist, CRNA and Surgeon  Anesthesia Plan Comments: (Patient consented for risks of anesthesia including but not limited to:  - adverse reactions to medications - damage to eyes, teeth, lips or  other oral mucosa - nerve damage due to positioning  - sore throat or hoarseness - Damage to heart, brain, nerves, lungs, other parts of body or loss of life  Patient voiced understanding and assent.)         Anesthesia Quick Evaluation

## 2022-11-08 NOTE — Discharge Instructions (Signed)

## 2022-11-09 ENCOUNTER — Encounter
Admission: RE | Disposition: A | Discharge status: Home / Self Care | Payer: Self-pay | Source: Home / Self Care | Attending: Ophthalmology

## 2022-11-09 ENCOUNTER — Other Ambulatory Visit: Payer: Self-pay

## 2022-11-09 ENCOUNTER — Encounter: Payer: Self-pay | Admitting: Ophthalmology

## 2022-11-09 ENCOUNTER — Ambulatory Visit: Payer: Medicare Other | Admitting: Anesthesiology

## 2022-11-09 ENCOUNTER — Ambulatory Visit
Admission: RE | Admit: 2022-11-09 | Discharge: 2022-11-09 | Disposition: A | Discharge status: Home / Self Care | Payer: Medicare Other | Attending: Ophthalmology | Admitting: Ophthalmology

## 2022-11-09 DIAGNOSIS — H2511 Age-related nuclear cataract, right eye: Secondary | ICD-10-CM | POA: Insufficient documentation

## 2022-11-09 DIAGNOSIS — E7800 Pure hypercholesterolemia, unspecified: Secondary | ICD-10-CM | POA: Diagnosis not present

## 2022-11-09 HISTORY — PX: CATARACT EXTRACTION W/PHACO: SHX586

## 2022-11-09 HISTORY — DX: Unspecified osteoarthritis, unspecified site: M19.90

## 2022-11-09 SURGERY — PHACOEMULSIFICATION, CATARACT, WITH IOL INSERTION
Anesthesia: Monitor Anesthesia Care | Laterality: Right

## 2022-11-09 MED ORDER — SIGHTPATH DOSE#1 NA HYALUR & NA CHOND-NA HYALUR IO KIT
PACK | INTRAOCULAR | Status: DC | PRN
  Administered 2022-11-09: 1 via OPHTHALMIC

## 2022-11-09 MED ORDER — ARMC OPHTHALMIC DILATING DROPS
1.0000 | OPHTHALMIC | Status: DC | PRN
  Administered 2022-11-09 (×3): 1 via OPHTHALMIC

## 2022-11-09 MED ORDER — LACTATED RINGERS IV SOLN: INTRAVENOUS | Status: DC

## 2022-11-09 MED ORDER — SIGHTPATH DOSE#1 BSS IO SOLN
INTRAOCULAR | Status: DC | PRN
  Administered 2022-11-09: 2 mL

## 2022-11-09 MED ORDER — TETRACAINE HCL 0.5 % OP SOLN
1.0000 [drp] | OPHTHALMIC | Status: DC | PRN
  Administered 2022-11-09 (×3): 1 [drp] via OPHTHALMIC

## 2022-11-09 MED ORDER — MIDAZOLAM HCL 2 MG/2ML IJ SOLN
INTRAMUSCULAR | Status: AC
  Filled 2022-11-09: qty 2

## 2022-11-09 MED ORDER — BRIMONIDINE TARTRATE-TIMOLOL 0.2-0.5 % OP SOLN
OPHTHALMIC | Status: DC | PRN
  Administered 2022-11-09: 1 [drp] via OPHTHALMIC

## 2022-11-09 MED ORDER — SIGHTPATH DOSE#1 BSS IO SOLN
INTRAOCULAR | Status: DC | PRN
  Administered 2022-11-09: 63 mL via OPHTHALMIC

## 2022-11-09 MED ORDER — CEFUROXIME OPHTHALMIC INJECTION 1 MG/0.1 ML
INJECTION | OPHTHALMIC | Status: DC | PRN
  Administered 2022-11-09: 1 mg via INTRACAMERAL

## 2022-11-09 MED ORDER — SIGHTPATH DOSE#1 BSS IO SOLN
INTRAOCULAR | Status: DC | PRN
  Administered 2022-11-09: 15 mL via INTRAOCULAR

## 2022-11-09 MED ORDER — MIDAZOLAM HCL 2 MG/2ML IJ SOLN
INTRAMUSCULAR | Status: DC | PRN
  Administered 2022-11-09: 2 mg via INTRAVENOUS

## 2022-11-09 SURGICAL SUPPLY — 11 items
CATARACT SUITE SIGHTPATH (MISCELLANEOUS) ×1
FEE CATARACT SUITE SIGHTPATH (MISCELLANEOUS) ×2 IMPLANT
GLOVE SRG 8 PF TXTR STRL LF DI (GLOVE) ×2 IMPLANT
GLOVE SURG ENC TEXT LTX SZ7.5 (GLOVE) ×2 IMPLANT
GLOVE SURG UNDER POLY LF SZ8 (GLOVE) ×1
LENS CLAREON VIVITY TORIC 20.5 ×1 IMPLANT
LENS CLRN VIVITY TORIC 6 20.5 ×1 IMPLANT
LENS IOL CLRN VT TRC 6 20.5 IMPLANT
NDL FILTER BLUNT 18X1 1/2 (NEEDLE) ×2 IMPLANT
NEEDLE FILTER BLUNT 18X1 1/2 (NEEDLE) ×1
SYR 3ML LL SCALE MARK (SYRINGE) ×2 IMPLANT

## 2022-11-09 NOTE — Op Note (Signed)
LOCATION:  [facility]   PREOPERATIVE DIAGNOSIS:  Nuclear sclerotic cataract of the right eye.  H25.11   POSTOPERATIVE DIAGNOSIS:  Nuclear sclerotic cataract of the right eye.   PROCEDURE:  Phacoemulsification with Toric posterior chamber intraocular lens placement of the right eye.  Ultrasound time: Procedure(s): CATARACT EXTRACTION PHACO AND INTRAOCULAR LENS PLACEMENT (IOC) RIGHT TORIC 7.96 00:48.4 (Right)  LENS:   Implant Name Type Inv. Item Serial No. Manufacturer Lot No. LRB No. Used Action  LENS CLAREON VIVITY TORIC 20.5 - [id number]  LENS CLAREON VIVITY TORIC 20.5 [id number] [employer]IGHT[employer]  Right 1 Implanted     [id number] Vivity Toric intraocular lens with 3.75 diopters of cylindrical power with axis orientation at 176 degrees.   SURGEON:  Deirdre Evener, MD   ANESTHESIA: Topical with tetracaine drops and 2% Xylocaine jelly, augmented with 1% preservative-free intracameral lidocaine. .   COMPLICATIONS:  None.   DESCRIPTION OF PROCEDURE:  The patient was identified in the holding room and transported to the operating suite and placed in the supine position under the operating microscope.  The right eye was identified as the operative eye, and it was prepped and draped in the usual sterile ophthalmic fashion.    A clear-corneal paracentesis incision was made at the 12:00 position.  0.5 ml of preservative-free 1% lidocaine was injected into the anterior chamber. The anterior chamber was filled with Viscoat.  A 2.4 millimeter near clear corneal incision was then made at the 9:00 position.  A cystotome and capsulorrhexis forceps were then used to make a curvilinear capsulorrhexis.  Hydrodissection and hydrodelineation were then performed using balanced salt solution.   Phacoemulsification was then used in stop and chop fashion to remove the lens, nucleus and epinucleus.  The remaining cortex was aspirated using the irrigation and aspiration handpiece.  Provisc viscoelastic  was then placed into the capsular bag to distend it for lens placement.  The Verion digital marker was used to align the implant at the intended axis.   A Toric lens was then injected into the capsular bag.  It was rotated clockwise until the axis marks on the lens were approximately 15 degrees in the counterclockwise direction to the intended alignment.  The viscoelastic was aspirated from the eye using the irrigation aspiration handpiece.  Then, a Koch spatula through the sideport incision was used to rotate the lens in a clockwise direction until the axis markings of the intraocular lens were lined up with the Verion alignment.  Balanced salt solution was then used to hydrate the wounds. Cefuroxime 0.1 ml of a 10mg /ml solution was injected into the anterior chamber for a dose of 1 mg of intracameral antibiotic at the completion of the case.    The eye was noted to have a physiologic pressure and there was no wound leak noted.   Timolol and Brimonidine drops were applied to the eye.  The patient was taken to the recovery room in stable condition having had no complications of anesthesia or surgery.  Natalie Sosa 11/09/2022, 9:33 AM

## 2022-11-09 NOTE — Anesthesia Postprocedure Evaluation (Signed)
Anesthesia Post Note  Patient: Natalie Sosa  Procedure(s) Performed: CATARACT EXTRACTION PHACO AND INTRAOCULAR LENS PLACEMENT (IOC) RIGHT TORIC 7.96 00:48.4 (Right)  Patient location during evaluation: PACU Anesthesia Type: MAC Level of consciousness: awake and alert Pain management: pain level controlled Vital Signs Assessment: post-procedure vital signs reviewed and stable Respiratory status: spontaneous breathing, nonlabored ventilation, respiratory function stable and patient connected to nasal cannula oxygen Cardiovascular status: stable and blood pressure returned to baseline Postop Assessment: no apparent nausea or vomiting Anesthetic complications: no   No notable events documented.   Last Vitals:  Vitals:   11/09/22 0934 11/09/22 0939  BP: 132/74 125/67  Pulse: 79 70  Resp: (!) 22 15  Temp: (!) 36.4 C 36.6 C  SpO2: 98% 98%    Last Pain:  Vitals:   11/09/22 0939  TempSrc:   PainSc: 0-No pain                 Marisue Humble

## 2022-11-09 NOTE — Transfer of Care (Signed)
Immediate Anesthesia Transfer of Care Note  Patient: Natalie Sosa  Procedure(s) Performed: CATARACT EXTRACTION PHACO AND INTRAOCULAR LENS PLACEMENT (IOC) RIGHT TORIC 7.96 00:48.4 (Right)  Patient Location: PACU  Anesthesia Type:MAC  Level of Consciousness: awake, alert , and oriented  Airway & Oxygen Therapy: Patient Spontanous Breathing  Post-op Assessment: Report given to RN and Post -op Vital signs reviewed and stable  Post vital signs: Reviewed and stable  Last Vitals:  Vitals Value Taken Time  BP    Temp    Pulse    Resp    SpO2      Last Pain:  Vitals:   11/09/22 0835  TempSrc: Temporal  PainSc: 0-No pain         Complications: No notable events documented.

## 2022-11-09 NOTE — H&P (Signed)
[facility]   Primary Care Physician:  Doreene Nest, NP Ophthalmologist: Dr. Lockie Mola  Pre-Procedure History & Physical: HPI:  Natalie Sosa is a 77 y.o. female here for ophthalmic surgery.   Past Medical History:  Diagnosis Date   Arthritis    right knee, right thumb   Cancer (HCC)    skin   COVID-19 12/31/2020   ENDOMETRIOSIS 10/04/2006   Qualifier: History of  By: Hetty Ely MD, Natalie Sosa     Past Surgical History:  Procedure Laterality Date   ACHILLES TENDON REPAIR  06/14/2004   CESAREAN SECTION     placenta previa   CHOLECYSTECTOMY  06/13/2001   COLONOSCOPY WITH PROPOFOL N/A 09/24/2021   Procedure: COLONOSCOPY WITH PROPOFOL;  Surgeon: Toney Reil, MD;  Location: [facility] ENDOSCOPY;  Service: Gastroenterology;  Laterality: N/A;    Prior to Admission medications   Medication Sig Start Date End Date Taking? Authorizing Provider  CALCIUM-VITAMIN D PO Take 2 tablets by mouth daily.   Yes [provider]  diphenhydrAMINE (BENADRYL) 25 mg capsule Take 25 mg by mouth 2 (two) times daily as needed.   Yes [provider]  glucosamine-chondroitin 500-400 MG tablet Take 1 tablet by mouth in the morning and at bedtime. 1500-1200   Yes [provider]  ibuprofen (ADVIL,MOTRIN) 200 MG tablet Use as directed   Yes [provider]  Multiple Vitamin (MULTIVITAMIN) tablet Take 1 tablet by mouth daily.   Yes [provider]  simvastatin (ZOCOR) 20 MG tablet TAKE 1 TABLET(20 MG) BY MOUTH AT BEDTIME FOR CHOLESTEROL 03/27/22  Yes Doreene Nest, NP    Allergies as of 10/04/2022 - Review Complete 08/18/2022  Allergen Reaction Noted   Ezetimibe-simvastatin     Hydrocodone     Prednisone  10/03/2006    Family History  Problem Relation Age of Onset   Heart disease Mother    Dementia Mother    Cancer Father        Pancreatic   Stroke Father    Breast cancer Paternal Grandmother 20   Breast cancer Paternal Aunt      Social History   Socioeconomic History   Marital status: Married    Spouse name: Not on file   Number of children: 1   Years of education: Not on file   Highest education level: Not on file  Occupational History   Not on file  Tobacco Use   Smoking status: Never   Smokeless tobacco: Never  Vaping Use   Vaping status: Never Used  Substance and Sexual Activity   Alcohol use: Yes    Comment: glass of wine occ   Drug use: Never   Sexual activity: Yes  Other Topics Concern   Not on file  Social History Narrative   Husband Elijah Birk, aware of her end of life wishes.  She would desire CPR, no feedings tubes.   Social Determinants of Health   Financial Resource Strain: Low Risk  (02/08/2021)   Overall Financial Resource Strain (CARDIA)    Difficulty of Paying Living Expenses: Not hard at all  Food Insecurity: No Food Insecurity (02/08/2021)   Hunger Vital Sign    Worried About Running Out of Food in the Last Year: Never true    Ran Out of Food in the Last Year: Never true  Transportation Needs: No Transportation Needs (02/08/2021)   PRAPARE - Administrator, Civil Service (Medical): No    Lack of Transportation (Non-Medical): No  Physical  Activity: Sufficiently Active (02/08/2021)   Exercise Vital Sign    Days of Exercise per Week: 3 days    Minutes of Exercise per Session: 120 min  Stress: No Stress Concern Present (02/08/2021)   [employer] of Occupational Health - Occupational Stress Questionnaire    Feeling of Stress : Not at all  Social Connections: Unknown (02/02/2022)   Social Connection and Isolation Panel [NHANES]    Frequency of Communication with Friends and Family: Not on file    Frequency of Social Gatherings with Friends and Family: Not on file    Attends Religious Services: Never    Active Member of Clubs or Organizations: Not on file    Attends Banker Meetings: Not on file    Marital Status: Not on file  Intimate Partner Violence:  Not At Risk (02/09/2022)   Humiliation, Afraid, Rape, and Kick questionnaire    Fear of Current or Ex-Partner: No    Emotionally Abused: No    Physically Abused: No    Sexually Abused: No    Review of Systems: See HPI, otherwise negative ROS  Physical Exam: BP (!) 143/75   Pulse 69   Temp (!) 97.2 F (36.2 C) (Temporal)   Resp (!) 22   Ht 5\' 8"  (1.727 m)   Wt 75.5 kg   SpO2 98%   BMI 25.30 kg/m  General:   Alert,  pleasant and cooperative in NAD Head:  Normocephalic and atraumatic. Lungs:  Clear to auscultation.    Heart:  Regular rate and rhythm.   Impression/Plan: Natalie Sosa is here for ophthalmic surgery.  Risks, benefits, limitations, and alternatives regarding ophthalmic surgery have been reviewed with the patient.  Questions have been answered.  All parties agreeable.   Lockie Mola, MD  11/09/2022, 8:53 AM

## 2022-11-10 ENCOUNTER — Encounter: Payer: Self-pay | Admitting: Ophthalmology

## 2022-11-10 DIAGNOSIS — H2511 Age-related nuclear cataract, right eye: Secondary | ICD-10-CM | POA: Diagnosis not present

## 2022-11-16 NOTE — Plan of Care (Signed)
[facility] Tonsillectomy/Adenoidectomy, Postoperative PEDS care plan entered in error.

## 2022-11-28 ENCOUNTER — Encounter: Payer: Self-pay | Admitting: Ophthalmology

## 2022-11-30 DIAGNOSIS — H2512 Age-related nuclear cataract, left eye: Secondary | ICD-10-CM | POA: Diagnosis not present

## 2022-11-30 NOTE — Anesthesia Preprocedure Evaluation (Addendum)
Anesthesia Evaluation  Patient identified by MRN, date of birth, ID band Patient awake    Reviewed: Allergy & Precautions, H&P , NPO status , Patient's Chart, lab work & pertinent test results  Airway Mallampati: III  TM Distance: <3 FB    Comment: Short TMD Dental  (+) Caps Cap or crown right upper lateral incisor:   Pulmonary neg pulmonary ROS          Cardiovascular negative cardio ROS      Neuro/Psych negative neurological ROS  negative psych ROS   GI/Hepatic negative GI ROS, Neg liver ROS,,,  Endo/Other  negative endocrine ROS    Renal/GU negative Renal ROS  negative genitourinary   Musculoskeletal  (+) Arthritis ,    Abdominal   Peds negative pediatric ROS (+)  Hematology negative hematology ROS (+)   Anesthesia Other Findings Previous cataract surgery 11-09-22 with Dr. Juel Burrow anesthesiologist   Arthritis Hx covid Hx cancer  Reproductive/Obstetrics negative OB ROS                             Anesthesia Physical Anesthesia Plan  ASA: 3  Anesthesia Plan: MAC   Post-op Pain Management:    Induction: Intravenous  PONV Risk Score and Plan:   Airway Management Planned: Natural Airway and Nasal Cannula  Additional Equipment:   Intra-op Plan:   Post-operative Plan:   Informed Consent: I have reviewed the patients History and Physical, chart, labs and discussed the procedure including the risks, benefits and alternatives for the proposed anesthesia with the patient or authorized representative who has indicated his/her understanding and acceptance.     Dental Advisory Given  Plan Discussed with: Anesthesiologist, CRNA and Surgeon  Anesthesia Plan Comments: (Patient consented for risks of anesthesia including but not limited to:  - adverse reactions to medications - damage to eyes, teeth, lips or other oral mucosa - nerve damage due to positioning  - sore throat or  hoarseness - Damage to heart, brain, nerves, lungs, other parts of body or loss of life  Patient voiced understanding and assent.)        Anesthesia Quick Evaluation

## 2022-12-05 NOTE — Discharge Instructions (Signed)

## 2022-12-07 ENCOUNTER — Ambulatory Visit: Payer: Medicare Other | Admitting: Anesthesiology

## 2022-12-07 ENCOUNTER — Encounter
Admission: RE | Disposition: A | Discharge status: Home / Self Care | Payer: Self-pay | Source: Home / Self Care | Attending: Ophthalmology

## 2022-12-07 ENCOUNTER — Other Ambulatory Visit: Payer: Self-pay

## 2022-12-07 ENCOUNTER — Ambulatory Visit
Admission: RE | Admit: 2022-12-07 | Discharge: 2022-12-07 | Disposition: A | Discharge status: Home / Self Care | Payer: Medicare Other | Attending: Ophthalmology | Admitting: Ophthalmology

## 2022-12-07 ENCOUNTER — Encounter: Payer: Self-pay | Admitting: Ophthalmology

## 2022-12-07 DIAGNOSIS — H2512 Age-related nuclear cataract, left eye: Secondary | ICD-10-CM | POA: Insufficient documentation

## 2022-12-07 HISTORY — PX: CATARACT EXTRACTION W/PHACO: SHX586

## 2022-12-07 SURGERY — PHACOEMULSIFICATION, CATARACT, WITH IOL INSERTION
Anesthesia: Monitor Anesthesia Care | Laterality: Left

## 2022-12-07 MED ORDER — SIGHTPATH DOSE#1 BSS IO SOLN
INTRAOCULAR | Status: DC | PRN
  Administered 2022-12-07: 1 mL

## 2022-12-07 MED ORDER — MIDAZOLAM HCL 2 MG/2ML IJ SOLN
INTRAMUSCULAR | Status: AC
  Filled 2022-12-07: qty 2

## 2022-12-07 MED ORDER — SIGHTPATH DOSE#1 BSS IO SOLN
INTRAOCULAR | Status: DC | PRN
  Administered 2022-12-07: 15 mL via INTRAOCULAR

## 2022-12-07 MED ORDER — FENTANYL CITRATE (PF) 100 MCG/2ML IJ SOLN
INTRAMUSCULAR | Status: AC
  Filled 2022-12-07: qty 2

## 2022-12-07 MED ORDER — SIGHTPATH DOSE#1 NA HYALUR & NA CHOND-NA HYALUR IO KIT
PACK | INTRAOCULAR | Status: DC | PRN
  Administered 2022-12-07: 1 via OPHTHALMIC

## 2022-12-07 MED ORDER — SIGHTPATH DOSE#1 BSS IO SOLN
INTRAOCULAR | Status: DC | PRN
  Administered 2022-12-07: 63 mL via OPHTHALMIC

## 2022-12-07 MED ORDER — CEFUROXIME OPHTHALMIC INJECTION 1 MG/0.1 ML
INJECTION | OPHTHALMIC | Status: DC | PRN
  Administered 2022-12-07: .1 mL via INTRACAMERAL

## 2022-12-07 MED ORDER — ARMC OPHTHALMIC DILATING DROPS
1.0000 | OPHTHALMIC | Status: DC | PRN
  Administered 2022-12-07 (×3): 1 via OPHTHALMIC

## 2022-12-07 MED ORDER — TETRACAINE HCL 0.5 % OP SOLN
1.0000 [drp] | OPHTHALMIC | Status: DC | PRN
  Administered 2022-12-07 (×3): 1 [drp] via OPHTHALMIC

## 2022-12-07 MED ORDER — FENTANYL CITRATE (PF) 100 MCG/2ML IJ SOLN
INTRAMUSCULAR | Status: DC | PRN
  Administered 2022-12-07 (×2): 50 ug via INTRAVENOUS

## 2022-12-07 MED ORDER — BRIMONIDINE TARTRATE-TIMOLOL 0.2-0.5 % OP SOLN
OPHTHALMIC | Status: DC | PRN
  Administered 2022-12-07: 1 [drp] via OPHTHALMIC

## 2022-12-07 MED ORDER — MIDAZOLAM HCL 2 MG/2ML IJ SOLN
INTRAMUSCULAR | Status: DC | PRN
  Administered 2022-12-07: 2 mg via INTRAVENOUS

## 2022-12-07 MED ORDER — TETRACAINE HCL 0.5 % OP SOLN
OPHTHALMIC | Status: AC
Start: 2022-12-07 — End: ?
  Filled 2022-12-07: qty 4

## 2022-12-07 SURGICAL SUPPLY — 11 items
CATARACT SUITE SIGHTPATH (MISCELLANEOUS) ×1 IMPLANT
FEE CATARACT SUITE SIGHTPATH (MISCELLANEOUS) ×2 IMPLANT
GLOVE SRG 8 PF TXTR STRL LF DI (GLOVE) ×2 IMPLANT
GLOVE SURG ENC TEXT LTX SZ7.5 (GLOVE) ×2 IMPLANT
GLOVE SURG UNDER POLY LF SZ8 (GLOVE) ×1
LENS CLAREON VIVITY TORIC 20.5 ×1 IMPLANT
LENS CLRN VIVITY TORIC 6 20.5 ×1 IMPLANT
LENS IOL CLRN VT TRC 6 20.5 IMPLANT
NDL FILTER BLUNT 18X1 1/2 (NEEDLE) ×2 IMPLANT
NEEDLE FILTER BLUNT 18X1 1/2 (NEEDLE) ×1 IMPLANT
SYR 3ML LL SCALE MARK (SYRINGE) ×2 IMPLANT

## 2022-12-07 NOTE — Transfer of Care (Signed)
Immediate Anesthesia Transfer of Care Note  Patient: Natalie Sosa  Procedure(s) Performed: CATARACT EXTRACTION PHACO AND INTRAOCULAR LENS PLACEMENT (IOC) LEFT  CLAREON VIVITY TORIC LENS 7.43 00:49.5 (Left)  Patient Location: PACU  Anesthesia Type: MAC  Level of Consciousness: awake, alert  and patient cooperative  Airway and Oxygen Therapy: Patient Spontanous Breathing and Patient connected to supplemental oxygen  Post-op Assessment: Post-op Vital signs reviewed, Patient's Cardiovascular Status Stable, Respiratory Function Stable, Patent Airway and No signs of Nausea or vomiting  Post-op Vital Signs: Reviewed and stable  Complications: No notable events documented.

## 2022-12-07 NOTE — H&P (Signed)
[facility]   Primary Care Physician:  Doreene Nest, NP Ophthalmologist: Dr. Lockie Mola  Pre-Procedure History & Physical: HPI:  Natalie Sosa is a 77 y.o. female here for ophthalmic surgery.   Past Medical History:  Diagnosis Date   Arthritis    right knee, right thumb   Cancer (HCC)    skin   COVID-19 12/31/2020   ENDOMETRIOSIS 10/04/2006   Qualifier: History of  By: Hetty Ely MD, Natalie Sosa     Past Surgical History:  Procedure Laterality Date   ACHILLES TENDON REPAIR  06/14/2004   CATARACT EXTRACTION W/PHACO Right 11/09/2022   Procedure: CATARACT EXTRACTION PHACO AND INTRAOCULAR LENS PLACEMENT (IOC) RIGHT TORIC 7.96 00:48.4;  Surgeon: Lockie Mola, MD;  Location: [facility];  Service: Ophthalmology;  Laterality: Right;   CESAREAN SECTION     placenta previa   CHOLECYSTECTOMY  06/13/2001   COLONOSCOPY WITH PROPOFOL N/A 09/24/2021   Procedure: COLONOSCOPY WITH PROPOFOL;  Surgeon: Toney Reil, MD;  Location: [facility];  Service: Gastroenterology;  Laterality: N/A;    Prior to Admission medications   Medication Sig Start Date End Date Taking? Authorizing Provider  CALCIUM-VITAMIN D PO Take 2 tablets by mouth daily.   Yes [provider]  diphenhydrAMINE (BENADRYL) 25 mg capsule Take 25 mg by mouth 2 (two) times daily as needed.   Yes [provider]  glucosamine-chondroitin 500-400 MG tablet Take 1 tablet by mouth in the morning and at bedtime. 1500-1200   Yes [provider]  ibuprofen (ADVIL,MOTRIN) 200 MG tablet Use as directed   Yes [provider]  Multiple Vitamin (MULTIVITAMIN) tablet Take 1 tablet by mouth daily.   Yes [provider]  simvastatin (ZOCOR) 20 MG tablet TAKE 1 TABLET(20 MG) BY MOUTH AT BEDTIME FOR CHOLESTEROL 03/27/22  Yes Doreene Nest, NP    Allergies as of 11/28/2022 - Review Complete 11/28/2022  Allergen Reaction Noted   Ezetimibe-simvastatin      Hydrocodone     Prednisone  10/03/2006    Family History  Problem Relation Age of Onset   Heart disease Mother    Dementia Mother    Cancer Father        Pancreatic   Stroke Father    Breast cancer Paternal Grandmother 50   Breast cancer Paternal Aunt     Social History   Socioeconomic History   Marital status: Married    Spouse name: Not on file   Number of children: 1   Years of education: Not on file   Highest education level: Not on file  Occupational History   Not on file  Tobacco Use   Smoking status: Never   Smokeless tobacco: Never  Vaping Use   Vaping status: Never Used  Substance and Sexual Activity   Alcohol use: Yes    Comment: glass of wine occ   Drug use: Never   Sexual activity: Yes  Other Topics Concern   Not on file  Social History Narrative   Husband Elijah Birk, aware of her end of life wishes.  She would desire CPR, no feedings tubes.   Social Determinants of Health   Financial Resource Strain: Low Risk  (02/08/2021)   Overall Financial Resource Strain (CARDIA)    Difficulty of Paying Living Expenses: Not hard at all  Food Insecurity: No Food Insecurity (02/08/2021)   Hunger Vital Sign    Worried About Running Out of Food in the Last Year: Never true    Ran Out  of Food in the Last Year: Never true  Transportation Needs: No Transportation Needs (02/08/2021)   PRAPARE - Administrator, Civil Service (Medical): No    Lack of Transportation (Non-Medical): No  Physical Activity: Sufficiently Active (02/08/2021)   Exercise Vital Sign    Days of Exercise per Week: 3 days    Minutes of Exercise per Session: 120 min  Stress: No Stress Concern Present (02/08/2021)   [employer] of Occupational Health - Occupational Stress Questionnaire    Feeling of Stress : Not at all  Social Connections: Unknown (02/02/2022)   Social Connection and Isolation Panel [NHANES]    Frequency of Communication with Friends and Family: Not on file    Frequency of  Social Gatherings with Friends and Family: Not on file    Attends Religious Services: Never    Active Member of Clubs or Organizations: Not on file    Attends Banker Meetings: Not on file    Marital Status: Not on file  Intimate Partner Violence: Not At Risk (02/09/2022)   Humiliation, Afraid, Rape, and Kick questionnaire    Fear of Current or Ex-Partner: No    Emotionally Abused: No    Physically Abused: No    Sexually Abused: No    Review of Systems: See HPI, otherwise negative ROS  Physical Exam: BP 133/82   Temp (!) 97.5 F (36.4 C) (Temporal)   Resp 18   Ht 5' 7.99" (1.727 m)   Wt 75.8 kg   SpO2 96%   BMI 25.43 kg/m  General:   Alert,  pleasant and cooperative in NAD Head:  Normocephalic and atraumatic. Lungs:  Clear to auscultation.    Heart:  Regular rate and rhythm.   Impression/Plan: Natalie Sosa is here for ophthalmic surgery.  Risks, benefits, limitations, and alternatives regarding ophthalmic surgery have been reviewed with the patient.  Questions have been answered.  All parties agreeable.   Lockie Mola, MD  12/07/2022, 11:32 AM

## 2022-12-07 NOTE — Op Note (Signed)
LOCATION:  [facility]   PREOPERATIVE DIAGNOSIS:  Nuclear sclerotic cataract of the left eye.  H25.12  POSTOPERATIVE DIAGNOSIS:  Nuclear sclerotic cataract of the left eye.   PROCEDURE:  Phacoemulsification with Toric posterior chamber intraocular lens placement of the left eye.  Ultrasound time: Procedure(s): CATARACT EXTRACTION PHACO AND INTRAOCULAR LENS PLACEMENT (IOC) LEFT  CLAREON VIVITY TORIC LENS (Left)  LENS:   Implant Name Type Inv. Item Serial No. Manufacturer Lot No. LRB No. Used Action  LENS CLAREON VIVITY TORIC 20.5 - [id number]  LENS CLAREON VIVITY TORIC 20.5 [id number] [employer]  Left 1 Implanted     [id number] Toric intraocular lens with 3.75 diopters of cylindrical power with axis orientation at 180 degrees.     SURGEON:  Deirdre Evener, MD   ANESTHESIA:  Topical with tetracaine drops and 2% Xylocaine jelly, augmented with 1% preservative-free intracameral lidocaine.  COMPLICATIONS:  None.   DESCRIPTION OF PROCEDURE:  The patient was identified in the holding room and transported to the operating suite and placed in the supine position under the operating microscope.  The left eye was identified as the operative eye, and it was prepped and draped in the usual sterile ophthalmic fashion.    A clear-corneal paracentesis incision was made at the 1:30 position.  0.5 ml of preservative-free 1% lidocaine was injected into the anterior chamber. The anterior chamber was filled with Viscoat.  A 2.4 millimeter near clear corneal incision was then made at the 10:30 position.  A cystotome and capsulorrhexis forceps were then used to make a curvilinear capsulorrhexis.  Hydrodissection and hydrodelineation were then performed using balanced salt solution.   Phacoemulsification was then used in stop and chop fashion to remove the lens, nucleus and epinucleus.  The remaining cortex was aspirated using the irrigation and aspiration handpiece.  Provisc viscoelastic was then  placed into the capsular bag to distend it for lens placement.  The Verion digital marker was used to align the implant at the intended axis.   A Toric lens was then injected into the capsular bag.  It was rotated clockwise until the axis marks on the lens were approximately 15 degrees in the counterclockwise direction to the intended alignment.  The viscoelastic was aspirated from the eye using the irrigation aspiration handpiece.  Then, a Koch spatula through the sideport incision was used to rotate the lens in a clockwise direction until the axis markings of the intraocular lens were lined up with the Verion alignment.  Balanced salt solution was then used to hydrate the wounds. Cefuroxime 0.1 ml of a 10mg /ml solution was injected into the anterior chamber for a dose of 1 mg of intracameral antibiotic at the completion of the case.    The eye was noted to have a physiologic pressure and there was no wound leak noted.   Timolol and Brimonidine drops were applied to the eye.  The patient was taken to the recovery room in stable condition having had no complications of anesthesia or surgery.  Natalie Sosa 12/07/2022, 1:32 PM

## 2022-12-08 ENCOUNTER — Encounter: Payer: Self-pay | Admitting: Ophthalmology

## 2022-12-13 NOTE — Anesthesia Postprocedure Evaluation (Signed)
Anesthesia Post Note  Patient: Natalie Sosa  Procedure(s) Performed: CATARACT EXTRACTION PHACO AND INTRAOCULAR LENS PLACEMENT (IOC) LEFT  CLAREON VIVITY TORIC LENS 7.43 00:49.5 (Left)  Anesthesia Type: MAC Anesthetic complications: no   No notable events documented.   Last Vitals:  Vitals:   12/07/22 1335 12/07/22 1340  BP: 129/82 123/75  Pulse: 67 66  Resp: 18 14  Temp: (!) 36.1 C (!) 36.1 C  SpO2: 98% 97%    Last Pain:  Vitals:   12/08/22 1118  TempSrc:   PainSc: 0-No pain                 Jordie Schreur C Taylorann Tkach

## 2022-12-21 ENCOUNTER — Other Ambulatory Visit: Payer: Self-pay

## 2022-12-21 DIAGNOSIS — E7800 Pure hypercholesterolemia, unspecified: Secondary | ICD-10-CM

## 2022-12-21 MED ORDER — SIMVASTATIN 20 MG PO TABS: ORAL_TABLET | ORAL | 0 refills | Status: DC

## 2022-12-27 DIAGNOSIS — Z0100 Encounter for examination of eyes and vision without abnormal findings: Secondary | ICD-10-CM | POA: Diagnosis not present

## 2022-12-28 DIAGNOSIS — H52222 Regular astigmatism, left eye: Secondary | ICD-10-CM | POA: Diagnosis not present

## 2022-12-28 DIAGNOSIS — H5213 Myopia, bilateral: Secondary | ICD-10-CM | POA: Diagnosis not present

## 2023-01-27 ENCOUNTER — Ambulatory Visit (INDEPENDENT_AMBULATORY_CARE_PROVIDER_SITE_OTHER): Payer: Medicare Other

## 2023-01-27 VITALS — Ht 66.0 in | Wt 167.0 lb

## 2023-01-27 DIAGNOSIS — Z0000 Encounter for general adult medical examination without abnormal findings: Secondary | ICD-10-CM | POA: Diagnosis not present

## 2023-01-27 NOTE — Progress Notes (Signed)
 Subjective:   Natalie Sosa is a 78 y.o. female who presents for Medicare Annual (Subsequent) preventive examination.  Visit Complete: Virtual I connected with  Natalie Sosa on 01/27/23 by a audio enabled telemedicine application and verified that I am speaking with the correct person using two identifiers.  Patient Location: Home  Provider Location: Home Office  I discussed the limitations of evaluation and management by telemedicine. The patient expressed understanding and agreed to proceed.  Vital Signs: Because this visit was a virtual/telehealth visit, some criteria may be missing or patient reported. Any vitals not documented were not able to be obtained and vitals that have been documented are patient reported.  Patient Medicare AWV questionnaire was completed by the patient on 01/23/23; I have confirmed that all information answered by patient is correct and no changes since this date.  Cardiac Risk Factors include: advanced age (>33men, >18 women);dyslipidemia     Objective:    Today's Vitals   01/23/23 1453 01/27/23 0815  Weight:  167 lb (75.8 kg)  Height:  5' 6 (1.676 m)  PainSc: 2  2    Body mass index is 26.95 kg/m.     01/27/2023    8:27 AM 12/07/2022   11:17 AM 11/09/2022    8:30 AM 02/09/2022    2:51 PM 09/24/2021    7:05 AM 02/08/2021    2:46 PM 12/16/2020    4:04 PM  Advanced Directives  Does Patient Have a Medical Advance Directive? Yes Yes Yes Yes Yes Yes Yes  Type of Estate Agent of [place];Living will Living will;Healthcare Power of [employer] Power of [place];Living will Healthcare Power of [place];Living will Healthcare Power of [place];Living will Healthcare Power of [place];Living will   Does patient want to make changes to medical advance directive?   No - Patient declined No - Patient declined  Yes (MAU/Ambulatory/Procedural Areas - Information given) No - Patient declined  Copy of Healthcare Power of Attorney in  Chart? Yes - validated most recent copy scanned in chart (See row information) No - copy requested Yes - validated most recent copy scanned in chart (See row information) Yes - validated most recent copy scanned in chart (See row information)  Yes - validated most recent copy scanned in chart (See row information)     Current Medications (verified) Outpatient Encounter Medications as of 01/27/2023  Medication Sig   CALCIUM-VITAMIN D PO Take 2 tablets by mouth daily.   diphenhydrAMINE (BENADRYL) 25 mg capsule Take 25 mg by mouth 2 (two) times daily as needed.   glucosamine-chondroitin 500-400 MG tablet Take 1 tablet by mouth in the morning and at bedtime. 1500-1200   ibuprofen (ADVIL,MOTRIN) 200 MG tablet Use as directed   Multiple Vitamin (MULTIVITAMIN) tablet Take 1 tablet by mouth daily.   simvastatin  (ZOCOR ) 20 MG tablet TAKE 1 TABLET(20 MG) BY MOUTH AT BEDTIME for cholesterol.   No facility-administered encounter medications on file as of 01/27/2023.    Allergies (verified) Ezetimibe-simvastatin , Hydrocodone, and Prednisone   History: Past Medical History:  Diagnosis Date   Arthritis    right knee, right thumb   Cancer (HCC)    skin   COVID-19 12/31/2020   ENDOMETRIOSIS 10/04/2006   Qualifier: History of  By: Bartley MD, Lamar Mulch    Past Surgical History:  Procedure Laterality Date   ACHILLES TENDON REPAIR  06/14/2004   CATARACT EXTRACTION W/PHACO Right 11/09/2022   Procedure: CATARACT EXTRACTION PHACO AND INTRAOCULAR LENS PLACEMENT (IOC) RIGHT TORIC 7.96  00:48.4;  Surgeon: Mittie Gaskin, MD;  Location: [facility];  Service: Ophthalmology;  Laterality: Right;   CATARACT EXTRACTION W/PHACO Left 12/07/2022   Procedure: CATARACT EXTRACTION PHACO AND INTRAOCULAR LENS PLACEMENT (IOC) LEFT  CLAREON VIVITY TORIC LENS 7.43 00:49.5;  Surgeon: Mittie Gaskin, MD;  Location: [facility];  Service: Ophthalmology;  Laterality: Left;   CESAREAN SECTION      placenta previa   CHOLECYSTECTOMY  06/13/2001   COLONOSCOPY WITH PROPOFOL  N/A 09/24/2021   Procedure: COLONOSCOPY WITH PROPOFOL ;  Surgeon: Unk Corinn Skiff, MD;  Location: [facility];  Service: Gastroenterology;  Laterality: N/A;   Family History  Problem Relation Age of Onset   Heart disease Mother    Dementia Mother    Cancer Father        Pancreatic   Stroke Father    Breast cancer Paternal Grandmother 61   Breast cancer Paternal Aunt    Social History   Socioeconomic History   Marital status: Married    Spouse name: Not on file   Number of children: 1   Years of education: Not on file   Highest education level: Not on file  Occupational History   Not on file  Tobacco Use   Smoking status: Never   Smokeless tobacco: Never  Vaping Use   Vaping status: Never Used  Substance and Sexual Activity   Alcohol use: Yes    Comment: glass of wine occ   Drug use: Never   Sexual activity: Yes  Other Topics Concern   Not on file  Social History Narrative   Husband Charlena, aware of her end of life wishes.  She would desire CPR, no feedings tubes.   Social Drivers of Corporate Investment Banker Strain: Low Risk  (01/27/2023)   Overall Financial Resource Strain (CARDIA)    Difficulty of Paying Living Expenses: Not hard at all  Food Insecurity: No Food Insecurity (01/27/2023)   Hunger Vital Sign    Worried About Running Out of Food in the Last Year: Never true    Ran Out of Food in the Last Year: Never true  Transportation Needs: No Transportation Needs (01/27/2023)   PRAPARE - Administrator, Civil Service (Medical): No    Lack of Transportation (Non-Medical): No  Physical Activity: Sufficiently Active (01/27/2023)   Exercise Vital Sign    Days of Exercise per Week: 3 days    Minutes of Exercise per Session: 120 min  Stress: No Stress Concern Present (01/27/2023)   [employer] of Occupational Health - Occupational Stress Questionnaire    Feeling of Stress : Not  at all  Social Connections: Moderately Integrated (01/27/2023)   Social Connection and Isolation Panel [NHANES]    Frequency of Communication with Friends and Family: Three times a week    Frequency of Social Gatherings with Friends and Family: More than three times a week    Attends Religious Services: Never    Database Administrator or Organizations: Yes    Attends Engineer, Structural: More than 4 times per year    Marital Status: Married    Tobacco Counseling Counseling given: Not Answered  Clinical Intake:  Pre-visit preparation completed: Yes  Pain : 0-10 Pain Score: 2  Pain Type: Chronic pain Pain Location: Knee (both;rt thumb) Pain Orientation: Right Pain Descriptors / Indicators: Aching Pain Onset: More than a month ago Pain Frequency: Intermittent Pain Relieving Factors: IBU, rest, knee brace,ice Effect of Pain on Daily Activities: none:still active  Pain Relieving Factors: IBU, rest, knee brace,ice  BMI - recorded: 26.95 Nutritional Status: BMI 25 -29 Overweight Nutritional Risks: None Diabetes: No  How often do you need to have someone help you when you read instructions, pamphlets, or other written materials from your doctor or pharmacy?: 1 - Never  Interpreter Needed?: No  Comments: lives with husband Information entered by :: B.Lexa Coronado,LPN   Activities of Daily Living    01/23/2023    2:53 PM 12/07/2022   11:16 AM  In your present state of health, do you have any difficulty performing the following activities:  Hearing? 0 0  Vision? 0 0  Difficulty concentrating or making decisions? 0 0  Walking or climbing stairs? 0   Dressing or bathing? 0   Doing errands, shopping? 0   Preparing Food and eating ? N   Using the Toilet? N   In the past six months, have you accidently leaked urine? N   Do you have problems with loss of bowel control? N   Managing your Medications? N   Managing your Finances? N   Housekeeping or managing your  Housekeeping? N     Patient Care Team: Gretta Comer POUR, NP as PCP - General (Internal Medicine) Regal, [place] RAMAN, DPM as Consulting Physician (Podiatry) Mittie Gaskin, MD as Referring Physician (Ophthalmology) Isenstein, Arin L, MD as Consulting Physician (Dermatology) Gracia Elsie HERO., MD as Consulting Physician (Dentistry)  Indicate any recent Medical Services you may have received from other than [facility] providers in the past year (date may be approximate).     Assessment:   This is a routine wellness examination for [place].  Hearing/Vision screen Hearing Screening - Comments:: Pt says her hearing is good Vision Screening - Comments:: Cataract surgery in Nov/Dec on both eye-Dr Brasington Vision is well   Goals Addressed             This Visit's Progress    Increase physical activity   On track    Starting 01/19/2018, I will continue to play tennis for at least 90 minutes 3-4 days per week.      Patient Stated   On track    01/28/2019, I will maintain and continue medications as prescribed.      Patient Stated   On track    Would like to maintain current routine     Patient Stated   On track    01/27/23 continue this goal:Lose weight        Depression Screen    01/27/2023    8:22 AM 02/17/2022    7:23 AM 02/09/2022    2:50 PM 02/08/2021    2:48 PM 02/05/2021   11:38 AM 02/07/2020    2:48 PM 02/03/2020    7:40 AM  PHQ 2/9 Scores  PHQ - 2 Score 0 0 0 0 0 0 2  PHQ- 9 Score     0 0 3    Fall Risk    01/23/2023    2:53 PM 02/17/2022    7:23 AM 02/02/2022   11:54 AM 02/08/2021    2:47 PM 02/05/2021   11:38 AM  Fall Risk   Falls in the past year? 0 0 0 0 0  Number falls in past yr:  0 0 0 0  Injury with Fall?  0 0 0 0  Risk for fall due to : No Fall Risks No Fall Risks Impaired vision No Fall Risks   Follow up Education provided;Falls prevention discussed Falls evaluation completed Falls  prevention discussed Falls prevention discussed     MEDICARE RISK AT  HOME: Medicare Risk at Home Any stairs in or around the home?: (Patient-Rptd) No Home free of loose throw rugs in walkways, pet beds, electrical cords, etc?: (Patient-Rptd) Yes Adequate lighting in your home to reduce risk of falls?: (Patient-Rptd) Yes Life alert?: (Patient-Rptd) No Use of a cane, walker or w/c?: (Patient-Rptd) No Grab bars in the bathroom?: (Patient-Rptd) No Shower chair or bench in shower?: (Patient-Rptd) No Elevated toilet seat or a handicapped toilet?: (Patient-Rptd) No  TIMED UP AND GO:  Was the test performed?  No    Cognitive Function:    02/07/2020    2:50 PM 01/28/2019    2:50 PM 01/19/2018   11:37 AM 01/13/2017    8:14 AM 01/07/2016    8:40 AM  MMSE - Mini Mental State Exam  Orientation to time 5 5 5 5 5   Orientation to Place 5 5 5 5 5   Registration 3 3 3 3 3   Attention/ Calculation 5 5 0 0 0  Recall 3 3 3 3 3   Language- name 2 objects   0 0 0  Language- repeat 1 1 1 1 1   Language- follow 3 step command   3 3 3   Language- read & follow direction   0 0 0  Write a sentence   0 0 0  Copy design   0 0 0  Total score   20 20 20         01/27/2023    8:28 AM 02/09/2022    2:53 PM  6CIT Screen  What Year? 0 points 0 points  What month? 0 points 0 points  What time? 0 points 0 points  Count back from 20 0 points 0 points  Months in reverse 0 points 0 points  Repeat phrase 0 points 0 points  Total Score 0 points 0 points    Immunizations Immunization History  Administered Date(s) Administered   Fluad Quad(high Dose 65+) 10/25/2018, 10/08/2019   Influenza Split 12/29/2010   Influenza Whole 11/05/2007, 11/09/2009   Influenza, High Dose Seasonal PF 10/02/2016   Influenza,inj,Quad PF,6+ Mos 10/04/2017   Influenza-Unspecified 10/24/2012, 10/07/2013, 10/21/2015, 10/12/2020, 10/25/2021, 12/12/2022   PFIZER(Purple Top)SARS-COV-2 Vaccination 02/16/2019, 03/09/2019, 10/24/2019, 04/23/2020, 10/12/2020   Pneumococcal Conjugate-13 01/01/2014   Pneumococcal  Polysaccharide-23 11/11/2010   Rsv, Bivalent, Protein Subunit Rsvpref,pf (Abrysvo) 09/28/2021   Td 10/13/2005   Unspecified SARS-COV-2 Vaccination 10/25/2021, 12/12/2022   Zoster Recombinant(Shingrix) 11/29/2018, 02/04/2019   Zoster, Live 03/20/2007    TDAP status: Up to date  Flu Vaccine status: Up to date  Pneumococcal vaccine status: Up to date  Covid-19 vaccine status: Completed vaccines  Qualifies for Shingles Vaccine? Yes   Zostavax completed Yes   Shingrix Completed?: Yes  Screening Tests Health Maintenance  Topic Date Due   MAMMOGRAM  04/04/2023   Medicare Annual Wellness (AWV)  01/27/2024   Colonoscopy  09/25/2026   Pneumonia Vaccine 58+ Years old  Completed   INFLUENZA VACCINE  Completed   DEXA SCAN  Completed   COVID-19 Vaccine  Completed   Hepatitis C Screening  Completed   Zoster Vaccines- Shingrix  Completed   HPV VACCINES  Aged Out   DTaP/Tdap/Td  Discontinued    Health Maintenance  There are no preventive care reminders to display for this patient.  Colorectal cancer screening: No longer required.   Mammogram status: No longer required due to AGE.  Bone Density status: Completed 03/31/2021. Results reflect: Bone density results: NORMAL.  Repeat every 5 years.  Lung Cancer Screening: (Low Dose CT Chest recommended if Age 76-80 years, 20 pack-year currently smoking OR have quit w/in 15years.) does not qualify.   Lung Cancer Screening Referral: no  Additional Screening:  Hepatitis C Screening: does not qualify; Completed 01/07/2016  Vision Screening: Recommended annual ophthalmology exams for early detection of glaucoma and other disorders of the eye. Is the patient up to date with their annual eye exam?  Yes  Who is the provider or what is the name of the office in which the patient attends annual eye exams? Dr Mittie If pt is not established with a provider, would they like to be referred to a provider to establish care? No .   Dental  Screening: Recommended annual dental exams for proper oral hygiene  Diabetic Foot Exam: n/a  Community Resource Referral / Chronic Care Management: CRR required this visit?  No   CCM required this visit?  No    Plan:     I have personally reviewed and noted the following in the patient's chart:   Medical and social history Use of alcohol, tobacco or illicit drugs  Current medications and supplements including opioid prescriptions. Patient is not currently taking opioid prescriptions. Functional ability and status Nutritional status Physical activity Advanced directives List of other physicians Hospitalizations, surgeries, and ER visits in previous 12 months Vitals Screenings to include cognitive, depression, and falls Referrals and appointments  In addition, I have reviewed and discussed with patient certain preventive protocols, quality metrics, and best practice recommendations. A written personalized care plan for preventive services as well as general preventive health recommendations were provided to patient.    Erminio LITTIE Saris, LPN   08/29/7972   After Visit Summary: (MyChart) Due to this being a telephonic visit, the after visit summary with patients personalized plan was offered to patient via MyChart   Nurse Notes:The patient states she is doing alright but is experiencing some numbness in top of her rt foot for a little over a month. She denies any pain there enad is ambulating without difficulty. She has appt next month and states she will inquire with PCP then. She has no concerns or questions at this time.

## 2023-01-27 NOTE — Patient Instructions (Signed)
 [name] , Thank you for taking time to come for your Medicare Wellness Visit. I appreciate your ongoing commitment to your health goals. Please review the following plan we discussed and let me know if I can assist you in the future.   Referrals/Orders/Follow-Ups/Clinician Recommendations: none  This is a list of the screening recommended for you and due dates:  Health Maintenance  Topic Date Due   Mammogram  04/04/2023   Medicare Annual Wellness Visit  01/27/2024   Colon Cancer Screening  09/25/2026   Pneumonia Vaccine  Completed   Flu Shot  Completed   DEXA scan (bone density measurement)  Completed   COVID-19 Vaccine  Completed   Hepatitis C Screening  Completed   Zoster (Shingles) Vaccine  Completed   HPV Vaccine  Aged Out   DTaP/Tdap/Td vaccine  Discontinued    Advanced directives: (In Chart) A copy of your advanced directives are scanned into your chart should your provider ever need it.  Next Medicare Annual Wellness Visit scheduled for next year: Yes 01/30/2024 @ 8:10am

## 2023-03-01 ENCOUNTER — Ambulatory Visit: Payer: Medicare Other | Admitting: Primary Care

## 2023-03-01 ENCOUNTER — Encounter: Payer: Self-pay | Admitting: Primary Care

## 2023-03-01 VITALS — BP 124/62 | HR 97 | Temp 98.1°F | Ht 66.0 in | Wt 166.0 lb

## 2023-03-01 DIAGNOSIS — Z1231 Encounter for screening mammogram for malignant neoplasm of breast: Secondary | ICD-10-CM

## 2023-03-01 DIAGNOSIS — E7800 Pure hypercholesterolemia, unspecified: Secondary | ICD-10-CM

## 2023-03-01 DIAGNOSIS — E2839 Other primary ovarian failure: Secondary | ICD-10-CM

## 2023-03-01 DIAGNOSIS — F4323 Adjustment disorder with mixed anxiety and depressed mood: Secondary | ICD-10-CM

## 2023-03-01 DIAGNOSIS — Z0001 Encounter for general adult medical examination with abnormal findings: Secondary | ICD-10-CM | POA: Diagnosis not present

## 2023-03-01 DIAGNOSIS — M150 Primary generalized (osteo)arthritis: Secondary | ICD-10-CM | POA: Diagnosis not present

## 2023-03-01 LAB — COMPREHENSIVE METABOLIC PANEL
ALT: 14 U/L (ref 0–35)
AST: 19 U/L (ref 0–37)
Albumin: 4.5 g/dL (ref 3.5–5.2)
Alkaline Phosphatase: 60 U/L (ref 39–117)
BUN: 24 mg/dL — ABNORMAL HIGH (ref 6–23)
CO2: 29 meq/L (ref 19–32)
Calcium: 9.8 mg/dL (ref 8.4–10.5)
Chloride: 103 meq/L (ref 96–112)
Creatinine, Ser: 0.82 mg/dL (ref 0.40–1.20)
GFR: 68.98 mL/min (ref >60.00)
Glucose, Bld: 81 mg/dL (ref 70–99)
Potassium: 4 meq/L (ref 3.5–5.1)
Sodium: 141 meq/L (ref 135–145)
Total Bilirubin: 0.6 mg/dL (ref 0.2–1.2)
Total Protein: 6.5 g/dL (ref 6.0–8.3)

## 2023-03-01 LAB — HEMOGLOBIN A1C: Hgb A1c MFr Bld: 5.5 % (ref 4.6–6.5)

## 2023-03-01 LAB — CBC
HCT: 41.8 % (ref 36.0–46.0)
Hemoglobin: 13.8 g/dL (ref 12.0–15.0)
MCHC: 33 g/dL (ref 30.0–36.0)
MCV: 92.7 fL (ref 78.0–100.0)
Platelets: 251 10*3/uL (ref 150.0–400.0)
RBC: 4.5 Mil/uL (ref 3.87–5.11)
RDW: 12.2 % (ref 11.5–15.5)
WBC: 5.8 10*3/uL (ref 4.0–10.5)

## 2023-03-01 LAB — LIPID PANEL
Cholesterol: 169 mg/dL (ref 0–200)
HDL: 65.3 mg/dL (ref >39.00)
LDL Cholesterol: 79 mg/dL (ref 0–99)
NonHDL: 104.1
Total CHOL/HDL Ratio: 3
Triglycerides: 127 mg/dL (ref 0.0–149.0)
VLDL: 25.4 mg/dL (ref 0.0–40.0)

## 2023-03-01 MED ORDER — SERTRALINE HCL 25 MG PO TABS: 25.0000 mg | ORAL_TABLET | Freq: Every day | ORAL | 0 refills | Status: DC

## 2023-03-01 NOTE — Patient Instructions (Signed)
 Stop by the lab prior to leaving today. I will notify you of your results once received.   [facility] and bone density scan.  Start sertraline  (Zoloft ) 25 mg for anxiety and depression.  Take 1 tablet by mouth once daily.  Please schedule a follow up visit for 6 weeks for follow up of anxiety/depression.  It was a pleasure to see you today!

## 2023-03-01 NOTE — Progress Notes (Signed)
 Subjective:    Patient ID: Natalie Sosa, female    DOB: Mar 25, 1945, 78 y.o.   MRN: [id number]  HPI  Natalie Sosa is a very pleasant 78 y.o. female who presents today for complete physical and follow up of chronic conditions.  She would also like to discuss symptoms of anxiety and depression. Chronic since early 2020 with the Covid-19 pandemic. Over the last 6 months she's noticed increased symptoms due to her health, her husbands health, and the current state of politics. Symptoms include worrying, irritability, feeling down, frustrated with her lack of physical ability to exercise. She is interested in medication treatment.      03/01/2023    9:26 AM  GAD 7 : Generalized Anxiety Score  Nervous, Anxious, on Edge 0  Control/stop worrying 1  Worry too much - different things 1  Trouble relaxing 0  Restless 0  Easily annoyed or irritable 2  Afraid - awful might happen 1  Total GAD 7 Score 5  Anxiety Difficulty Somewhat difficult       03/01/2023    9:26 AM 01/27/2023    8:22 AM 02/17/2022    7:23 AM  PHQ9 SCORE ONLY  PHQ-9 Total Score 3 0 0     Immunizations: -Tetanus: Completed in 2007 -Influenza: Completed this season  -Shingles: Completed Shingrix series -Pneumonia: Completed Prevar 13 in 2015, pneumovax in 2012  Diet: Fair diet.  Exercise: Plays Tennis twice weekly   Eye exam: Completes annually  Dental exam: Completes semi-annually    Mammogram: March 2024 Bone Density Scan: March 2023  Colonoscopy: Completed in 2023, due 2028  BP Readings from Last 3 Encounters:  03/01/23 124/62  12/07/22 123/75  11/09/22 125/67       Review of Systems  Constitutional:  Negative for unexpected weight change.  HENT:  Negative for rhinorrhea.   Respiratory:  Negative for cough and shortness of breath.   Cardiovascular:  Negative for chest pain.  Gastrointestinal:  Negative for constipation and diarrhea.  Genitourinary:  Negative for difficulty urinating.   Musculoskeletal:  Positive for arthralgias and back pain.  Skin:  Negative for rash.  Allergic/Immunologic: Negative for environmental allergies.  Neurological:  Positive for numbness. Negative for dizziness and headaches.  Psychiatric/Behavioral:  The patient is nervous/anxious.          Past Medical History:  Diagnosis Date   Arthritis    right knee, right thumb   Bright red rectal bleeding 09/01/2021   Cancer (HCC)    skin   COVID-19 12/31/2020   ENDOMETRIOSIS 10/04/2006   Qualifier: History of  By: Bartley MD, Lamar Mulch    Suprapubic pressure 09/01/2021    Social History   Socioeconomic History   Marital status: Married    Spouse name: Not on file   Number of children: 1   Years of education: Not on file   Highest education level: Bachelor's degree (e.g., BA, AB, BS)  Occupational History   Not on file  Tobacco Use   Smoking status: Never   Smokeless tobacco: Never  Vaping Use   Vaping status: Never Used  Substance and Sexual Activity   Alcohol use: Yes    Comment: glass of wine occ   Drug use: Never   Sexual activity: Yes  Other Topics Concern   Not on file  Social History Narrative   Husband Natalie Sosa, aware of her end of life wishes.  She would desire CPR, no feedings tubes.   Social Drivers of Health  Financial Resource Strain: Low Risk  (02/24/2023)   Overall Financial Resource Strain (CARDIA)    Difficulty of Paying Living Expenses: Not hard at all  Food Insecurity: No Food Insecurity (02/24/2023)   Hunger Vital Sign    Worried About Running Out of Food in the Last Year: Never true    Ran Out of Food in the Last Year: Never true  Transportation Needs: No Transportation Needs (02/24/2023)   PRAPARE - Administrator, Civil Service (Medical): No    Lack of Transportation (Non-Medical): No  Physical Activity: Sufficiently Active (02/24/2023)   Exercise Vital Sign    Days of Exercise per Week: 2 days    Minutes of Exercise per Session: 90  min  Stress: Stress Concern Present (02/24/2023)   [employer] of Occupational Health - Occupational Stress Questionnaire    Feeling of Stress : To some extent  Social Connections: Unknown (02/24/2023)   Social Connection and Isolation Panel [NHANES]    Frequency of Communication with Friends and Family: More than three times a week    Frequency of Social Gatherings with Friends and Family: Three times a week    Attends Religious Services: Patient declined    Active Member of Clubs or Organizations: Yes    Attends Banker Meetings: More than 4 times per year    Marital Status: Married  Catering Manager Violence: Not At Risk (01/27/2023)   Humiliation, Afraid, Rape, and Kick questionnaire    Fear of Current or Ex-Partner: No    Emotionally Abused: No    Physically Abused: No    Sexually Abused: No    Past Surgical History:  Procedure Laterality Date   ACHILLES TENDON REPAIR  06/14/2004   CATARACT EXTRACTION W/PHACO Right 11/09/2022   Procedure: CATARACT EXTRACTION PHACO AND INTRAOCULAR LENS PLACEMENT (IOC) RIGHT TORIC 7.96 00:48.4;  Surgeon: Mittie Gaskin, MD;  Location: [facility];  Service: Ophthalmology;  Laterality: Right;   CATARACT EXTRACTION W/PHACO Left 12/07/2022   Procedure: CATARACT EXTRACTION PHACO AND INTRAOCULAR LENS PLACEMENT (IOC) LEFT  CLAREON VIVITY TORIC LENS 7.43 00:49.5;  Surgeon: Mittie Gaskin, MD;  Location: [facility];  Service: Ophthalmology;  Laterality: Left;   CESAREAN SECTION     placenta previa   CHOLECYSTECTOMY  06/13/2001   COLONOSCOPY WITH PROPOFOL  N/A 09/24/2021   Procedure: COLONOSCOPY WITH PROPOFOL ;  Surgeon: Unk Corinn Skiff, MD;  Location: [facility] END[facility];  Service: Gastroenterology;  Laterality: N/A;    Family History  Problem Relation Age of Onset   Heart disease Mother    Dementia Mother    Cancer Father        Pancreatic   Stroke Father    Breast cancer Paternal Grandmother 27   Breast  cancer Paternal Aunt     Allergies  Allergen Reactions   Ezetimibe-Simvastatin      REACTION: Achey   Hydrocodone     REACTION: Itchy, rash   Prednisone     REACTION: anxiety, hyper    Current Outpatient Medications on File Prior to Visit  Medication Sig Dispense Refill   CALCIUM-VITAMIN D PO Take 2 tablets by mouth daily.     diphenhydrAMINE (BENADRYL) 25 mg capsule Take 25 mg by mouth 2 (two) times daily as needed.     glucosamine-chondroitin 500-400 MG tablet Take 1 tablet by mouth in the morning and at bedtime. 1500-1200     ibuprofen (ADVIL,MOTRIN) 200 MG tablet Use as directed     Multiple Vitamin (MULTIVITAMIN) tablet Take 1 tablet by  mouth daily.     simvastatin  (ZOCOR ) 20 MG tablet TAKE 1 TABLET(20 MG) BY MOUTH AT BEDTIME for cholesterol. 90 tablet 0   No current facility-administered medications on file prior to visit.    BP 124/62   Pulse 97   Temp 98.1 F (36.7 C) (Temporal)   Ht 5' 6 (1.676 m)   Wt 166 lb (75.3 kg)   SpO2 98%   BMI 26.79 kg/m  Objective:   Physical Exam HENT:     Right Ear: Tympanic membrane and ear canal normal.     Left Ear: Tympanic membrane and ear canal normal.  Eyes:     Pupils: Pupils are equal, round, and reactive to light.  Cardiovascular:     Rate and Rhythm: Normal rate and regular rhythm.  Pulmonary:     Effort: Pulmonary effort is normal.     Breath sounds: Normal breath sounds.  Abdominal:     General: Bowel sounds are normal.     Palpations: Abdomen is soft.     Tenderness: There is no abdominal tenderness.  Musculoskeletal:        General: Normal range of motion.     Cervical back: Neck supple.  Skin:    General: Skin is warm and dry.  Neurological:     Mental Status: She is alert and oriented to person, place, and time.     Cranial Nerves: No cranial nerve deficit.     Deep Tendon Reflexes:     Reflex Scores:      Patellar reflexes are 2+ on the right side and 2+ on the left side. Psychiatric:        Mood and  Affect: Mood normal.           Assessment & Plan:  Encounter for annual general medical examination with abnormal findings in adult Assessment & Plan: Immunizations UTD. Mammogram and bone density scan due in March, orders placed. Colonoscopy UTD, due 2028  Discussed the importance of a healthy diet and regular exercise in order for weight loss, and to reduce the risk of further co-morbidity.  Exam stable. Labs pending.  Follow up in 1 year for repeat physical.    Screening mammogram for breast cancer -     3D Screening Mammogram, Left and Right; Future  Estrogen deficiency -     DG Bone Density; Future  HYPERCHOLESTEROLEMIA Assessment & Plan: Repeat lipid panel pending.  Continue simvastatin  20 mg daily.  Orders: -     Lipid panel -     Comprehensive metabolic panel -     CBC -     Hemoglobin A1c  Adjustment reaction with anxiety and depression Assessment & Plan: Discussed options for treatment. She elects for medication treatment.  Start Zoloft  25 mg daily. We discussed possible side effects of headache, GI upset, drowsiness, and SI/HI.  Patient verbalized understanding.   Follow up in 6 weeks for re-evaluation.      Orders: -     Sertraline  HCl; Take 1 tablet (25 mg total) by mouth daily. for anxiety and depression.  Dispense: 90 tablet; Refill: 0  Primary osteoarthritis involving multiple joints Assessment & Plan: Chronic and continued.  Following with orthopedics and chiropractor.         Leverett Camplin K Rashia Mckesson, NP

## 2023-03-01 NOTE — Assessment & Plan Note (Signed)
Repeat lipid panel pending. Continue simvastatin 20 mg daily.  

## 2023-03-01 NOTE — Assessment & Plan Note (Signed)
 Immunizations UTD. Mammogram and bone density scan due in March, orders placed. Colonoscopy UTD, due 2028  Discussed the importance of a healthy diet and regular exercise in order for weight loss, and to reduce the risk of further co-morbidity.  Exam stable. Labs pending.  Follow up in 1 year for repeat physical.

## 2023-03-01 NOTE — Assessment & Plan Note (Signed)
 Discussed options for treatment. She elects for medication treatment.  Start Zoloft  25 mg daily. We discussed possible side effects of headache, GI upset, drowsiness, and SI/HI.  Patient verbalized understanding.   Follow up in 6 weeks for re-evaluation.

## 2023-03-01 NOTE — Assessment & Plan Note (Signed)
 Chronic and continued.  Following with orthopedics and chiropractor.

## 2023-03-07 ENCOUNTER — Ambulatory Visit (INDEPENDENT_AMBULATORY_CARE_PROVIDER_SITE_OTHER): Payer: Medicare Other

## 2023-03-07 DIAGNOSIS — K080 Exfoliation of teeth due to systemic causes: Secondary | ICD-10-CM | POA: Diagnosis not present

## 2023-03-07 DIAGNOSIS — Z23 Encounter for immunization: Secondary | ICD-10-CM

## 2023-03-07 NOTE — Progress Notes (Signed)
Per orders of Mayra Reel, DPN AGNP-C, injection of prevnar 20 given by Lewanda Rife in right deltoid. Patient tolerated injection well. Pt waited 10 -15 mins without any complaint or problem

## 2023-03-20 ENCOUNTER — Other Ambulatory Visit (INDEPENDENT_AMBULATORY_CARE_PROVIDER_SITE_OTHER): Payer: Self-pay

## 2023-03-20 ENCOUNTER — Ambulatory Visit: Payer: Medicare Other | Admitting: Orthopaedic Surgery

## 2023-03-20 DIAGNOSIS — M7542 Impingement syndrome of left shoulder: Secondary | ICD-10-CM | POA: Diagnosis not present

## 2023-03-20 DIAGNOSIS — G8929 Other chronic pain: Secondary | ICD-10-CM

## 2023-03-20 DIAGNOSIS — M25512 Pain in left shoulder: Secondary | ICD-10-CM

## 2023-03-20 DIAGNOSIS — R200 Anesthesia of skin: Secondary | ICD-10-CM | POA: Diagnosis not present

## 2023-03-20 MED ORDER — LIDOCAINE HCL 1 % IJ SOLN
3.0000 mL | INTRAMUSCULAR | Status: AC | PRN
Start: 2023-03-20 — End: 2023-03-20
  Administered 2023-03-20: 3 mL

## 2023-03-20 MED ORDER — METHYLPREDNISOLONE ACETATE 40 MG/ML IJ SUSP
40.0000 mg | INTRAMUSCULAR | Status: AC | PRN
Start: 2023-03-20 — End: 2023-03-20
  Administered 2023-03-20: 40 mg via INTRA_ARTICULAR

## 2023-03-20 NOTE — Progress Notes (Signed)
 The patient is a 78 year old active female that we have seen before.  She comes in today for evaluation treatment of continued left shoulder pain with decreased motion and pain reaching behind her and over activities.  For 4 months now though she is also had right foot numbness with no known injury.  She is not a diabetic.  She denies any sciatic nerve or back pain.  This has been persistently and is become bothersome for her.  There is been no pain.  She again denies any weakness in her lower extremities.  She denies any change in bowel and bladder function.  On examination of her left shoulder she does show signs of impingement.  Her internal rotation with adduction reaching behind her is only to the lower lumbar spine.  She can reach higher with the right shoulder.  She has positive Neer and Hawkins signs as well.  3 views of the left shoulder x-ray well shows normal-appearing left shoulder.  Examination of her bilateral extremity shows numbness and tingling in her right foot dorsally.  There is no weakness with dorsiflexion or plantarflexion.  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  She Has Negative Straight Leg Raise As Well.  I Would like to try a steroid injection for the left shoulder today and send her to physical therapy for her left shoulder.  We also need to obtain an MRI of her lumbar spine to rule out nerve compression as a relates to persistent numbness and tingling in her right foot.  She has already been seen also by her primary care physician to make sure this was not a metabolic issue.  We will see her back in about 3 weeks to hopefully see how she has done with therapy and the steroid injection of her left shoulder combined with hopefully going over a MRI of her lumbar spine.  She agrees with this treatment plan.    Procedure Note  Patient: Natalie Sosa             Date of Birth: 05/01/1945           MRN: [id number]             Visit Date:  03/20/2023  Procedures: Visit Diagnoses:  1. Chronic left shoulder pain   2. Impingement syndrome of left shoulder   3. Numbness of right foot     Large Joint Inj: L subacromial bursa on 03/20/2023 8:51 AM Indications: pain and diagnostic evaluation Details: 22 G 1.5 in needle  Arthrogram: No  Medications: 3 mL lidocaine 1 %; 40 mg methylPREDNISolone acetate 40 MG/ML Outcome: tolerated well, no immediate complications Procedure, treatment alternatives, risks and benefits explained, specific risks discussed. Consent was given by the patient. Immediately prior to procedure a time out was called to verify the correct patient, procedure, equipment, support staff and site/side marked as required. Patient was prepped and draped in the usual sterile fashion.

## 2023-03-21 ENCOUNTER — Telehealth: Payer: Self-pay | Admitting: Orthopaedic Surgery

## 2023-03-21 ENCOUNTER — Other Ambulatory Visit: Payer: Self-pay | Admitting: Orthopaedic Surgery

## 2023-03-21 ENCOUNTER — Other Ambulatory Visit: Payer: Self-pay

## 2023-03-21 DIAGNOSIS — R200 Anesthesia of skin: Secondary | ICD-10-CM

## 2023-03-21 DIAGNOSIS — G8929 Other chronic pain: Secondary | ICD-10-CM

## 2023-03-21 MED ORDER — DIAZEPAM 5 MG PO TABS: 5.0000 mg | ORAL_TABLET | Freq: Once | ORAL | 0 refills | Status: AC

## 2023-03-21 NOTE — Telephone Encounter (Signed)
 Patient request Rx for something due to begin claustrophobic. She has a Mri on 3/725. Pt wants to know also if she should take her Rx for Sertraline the day of the mri or not?  [facility] on [address] in [place]

## 2023-03-22 NOTE — Telephone Encounter (Signed)
 Patient aware of Blackmans message

## 2023-03-24 ENCOUNTER — Encounter: Payer: Self-pay | Admitting: Orthopaedic Surgery

## 2023-03-27 ENCOUNTER — Other Ambulatory Visit: Payer: Self-pay

## 2023-03-27 ENCOUNTER — Ambulatory Visit: Attending: Orthopaedic Surgery

## 2023-03-27 DIAGNOSIS — R293 Abnormal posture: Secondary | ICD-10-CM | POA: Diagnosis not present

## 2023-03-27 DIAGNOSIS — M25612 Stiffness of left shoulder, not elsewhere classified: Secondary | ICD-10-CM | POA: Insufficient documentation

## 2023-03-27 DIAGNOSIS — R928 Other abnormal and inconclusive findings on diagnostic imaging of breast: Secondary | ICD-10-CM | POA: Insufficient documentation

## 2023-03-27 DIAGNOSIS — M25512 Pain in left shoulder: Secondary | ICD-10-CM | POA: Insufficient documentation

## 2023-03-27 DIAGNOSIS — E7800 Pure hypercholesterolemia, unspecified: Secondary | ICD-10-CM

## 2023-03-27 MED ORDER — SIMVASTATIN 20 MG PO TABS: ORAL_TABLET | ORAL | 2 refills | Status: DC

## 2023-03-27 NOTE — Therapy (Signed)
 OUTPATIENT PHYSICAL THERAP EVALUATION   Patient Name: NATTALY YEBRA MRN: [id number] DOB:11-29-1945, 78 y.o., female Today's Date: 03/27/2023  END OF SESSION:  PT End of Session - 03/27/23 1302     Visit Number 1    Number of Visits 17    Date for PT Re-Evaluation 05/26/23    PT Start Time 1302    PT Stop Time 1347    PT Time Calculation (min) 45 min    Activity Tolerance Patient tolerated treatment well    Behavior During Therapy [facility] for tasks assessed/performed             Past Medical History:  Diagnosis Date   Arthritis    right knee, right thumb   Bright red rectal bleeding 09/01/2021   Cancer (HCC)    skin   COVID-19 12/31/2020   ENDOMETRIOSIS 10/04/2006   Qualifier: History of  By: Hetty Ely MD, [name]    Suprapubic pressure 09/01/2021   Past Surgical History:  Procedure Laterality Date   ACHILLES TENDON REPAIR  06/14/2004   CATARACT EXTRACTION W/PHACO Right 11/09/2022   Procedure: CATARACT EXTRACTION PHACO AND INTRAOCULAR LENS PLACEMENT (IOC) RIGHT TORIC 7.96 00:48.4;  Surgeon: Lockie Mola, MD;  Location: [facility];  Service: Ophthalmology;  Laterality: Right;   CATARACT EXTRACTION W/PHACO Left 12/07/2022   Procedure: CATARACT EXTRACTION PHACO AND INTRAOCULAR LENS PLACEMENT (IOC) LEFT  CLAREON VIVITY TORIC LENS 7.43 00:49.5;  Surgeon: Lockie Mola, MD;  Location: [facility];  Service: Ophthalmology;  Laterality: Left;   CESAREAN SECTION     placenta previa   CHOLECYSTECTOMY  06/13/2001   COLONOSCOPY WITH PROPOFOL N/A 09/24/2021   Procedure: COLONOSCOPY WITH PROPOFOL;  Surgeon: Toney Reil, MD;  Location: [facility] ENDOSCOPY;  Service: Gastroenterology;  Laterality: N/A;   Patient Active Problem List   Diagnosis Date Noted   Adjustment reaction with anxiety and depression 03/01/2023   Osteoarthritis 02/17/2022   Encounter for annual general medical examination with abnormal findings in adult 02/17/2022   Arthritis of  carpometacarpal [facility]) joint of right thumb 12/28/2021   Polyp of cecum    Unilateral primary osteoarthritis, right knee 09/22/2021   Screen for colon cancer 01/11/2016   Insomnia 01/01/2014   HYPERCHOLESTEROLEMIA 10/04/2006    PCP: Doreene Nest, NP   REFERRING PROVIDER: Kathryne Hitch, MD  REFERRING DIAG: [phone] (ICD-10-CM) - Coracoid impingement of left shoulder    THERAPY DIAG:  Left shoulder pain, unspecified chronicity - Plan: PT plan of care cert/re-cert  Abnormal posture - Plan: PT plan of care cert/re-cert  Stiffness of left shoulder, not elsewhere classified - Plan: PT plan of care cert/re-cert  Rationale for Evaluation and Treatment: Rehabilitation  ONSET DATE: June or July 2024  SUBJECTIVE:  SUBJECTIVE STATEMENT: L anterior shoulder and arm 0/10 currently at rest; 7/10 at worst for the past 3 months.   Hand dominance: Right  PERTINENT HISTORY:  L shoulder pain. Pain is mainly in the front of her arm. Pain began suddenly almost a year ago back in June or July 2024. Unknown mechanism of injury. Pain has stayed about the same since onset. Pt knits a lot, which is the main thing she knows that she does with her L hand. Plays tennis but only uses her L hand to toss the ball up for a serve. Also getting an MRI for her low back this Friday for another issue (R dorsal foot numbness).    No latex allergies. No blood pressure problems per pt.   PAIN:  Are you having pain? Yes: NPRS scale: 0/10 Pain location: L anterior shoulder and arm Pain description: sharp ache.  Aggravating factors: reaching back, putting her hand into a coat sleeve, fixing her hair, unable to don a bra secondary to L shoulder and anterior arm pain Relieving factors: resting position,  rubbing that area  PRECAUTIONS: Osteopenia/Osteoporosis  RED FLAGS: Bowel or bladder incontinence: No and Cauda equina syndrome: No     WEIGHT BEARING RESTRICTIONS: No  FALLS:  Has patient fallen in last 6 months? No  LIVING ENVIRONMENT: Lives with: lives with their spouse Lives in: House/apartment Stairs: No Has following equipment at home: None  OCCUPATION: retired  PLOF: Independent  PATIENT GOALS: Be able to don and doff clothes such as a bra and to be able to sleep on her L side/shoulder.   NEXT MD VISIT: 04/17/2023  OBJECTIVE:  Note: Objective measures were completed at Evaluation unless otherwise noted.  DIAGNOSTIC FINDINGS:  XR Shoulder Left  03/20/2023 Result Narrative  3 views of the left shoulder show no acute findings.  The shoulder is well located.  There is no significant glenohumeral arthritis.  The humeral head is not high riding in the subacromial outlet is well-maintained.      PATIENT SURVEYS:  UEFI score 63/80 (03/27/2023)  COGNITION: Overall cognitive status: Within functional limits for tasks assessed  SENSATION: WFL  POSTURE: forward neck, movement preference around C5/C6 area, and C3/C4 area, B protracted shoulders, R shoulder higher, thoracic kyphosis, R lateral shift   PALPATION: No TTP L shoulder, B upper trap and cervical paraspinal muscle tension.    CERVICAL ROM:   Active ROM A/PROM (deg) eval  Flexion full  Extension WFL  Right lateral flexion WFL  Left lateral flexion WFL  Right rotation WFL  Left rotation WFL   (Blank rows = not tested)  UPPER EXTREMITY ROM:  Active ROM Right eval Left eval  Shoulder flexion 136 118 (120 AAROM, stiff end range), with reproduction of symptoms.  Shoulder extension    Shoulder abduction 155 120 (132 AAROM, stiff end feel) with reproduction of symptoms.   Shoulder adduction    Shoulder extension    Shoulder internal rotation (functional) full L thumb to L3 spinous process. With  pain  Shoulder external rotation (functional) R 3rd digit to T1 transverse process L 3rd digit to T1 transverse process  Elbow flexion    Elbow extension    Wrist flexion    Wrist extension    Wrist ulnar deviation    Wrist radial deviation    Wrist pronation    Wrist supination     (Blank rows = not tested)  UPPER EXTREMITY MMT:  MMT Right eval Left eval  Shoulder flexion 4+ 4+  Shoulder extension    Shoulder abduction 4+ 4          Shoulder internal rotation 4+ 4  Shoulder external rotation 4 4  Middle trapezius  3  Lower trapezius   4-  Elbow flexion  4  Elbow extension  4  Wrist flexion    Wrist extension    Wrist ulnar deviation    Wrist radial deviation    Wrist pronation    Wrist supination    Grip strength     (Blank rows = not tested)  SPECIAL TESTS:  (+) Hawkins-Kennedy test (-) empty can test  (+) Neer's impingement.   FUNCTIONAL TESTS:    TREATMENT DATE: 03/27/2023                                                                                                                               Manual therapy   Supine STM L teres major muscle to decrease tension   123 degrees L shoulder flexion AROM afterwards, felt looser per pt.     PATIENT EDUCATION:  Education details: POC Person educated: Patient Education method: Explanation Education comprehension: verbalized understanding  HOME EXERCISE PROGRAM:   ASSESSMENT:  CLINICAL IMPRESSION: Patient is a 78 y.o. female who was seen today for physical therapy evaluation and treatment for L shoulder pain. She also presents with altered posture, limited L shoulder flexion, abduction, ER and IR AROM with reproduction of pain, scapular weakness, decreased L shoulder ER and IR strength, positive special tests suggesting shoulder impingement, and difficulty performing tasks which involve reaching up, behind, as well as donning and doffing shirts and coats secondary to pain. Pt will benefit from skilled  physical therapy services to address the aforementioned deficits.      OBJECTIVE IMPAIRMENTS: decreased ROM, decreased strength, improper body mechanics, postural dysfunction, and pain.   ACTIVITY LIMITATIONS: lifting, sleeping, bathing, dressing, reach over head, and hygiene/grooming  PARTICIPATION LIMITATIONS:   PERSONAL FACTORS: Age, Time since onset of injury/illness/exacerbation, and 1 comorbidity: arthritis  are also affecting patient's functional outcome.   REHAB POTENTIAL: Fair    CLINICAL DECISION MAKING: Stable/uncomplicated  EVALUATION COMPLEXITY: Low   GOALS: Goals reviewed with patient? Yes  SHORT TERM GOALS: Target date: 04/07/2023  Pt will be independent with her initial HEP to improve L shoulder AROM, strength, function, and ability to reach more comfortably.  Baseline: Pt has not yet started her initial HEP (03/27/2023) Goal status: INITIAL    LONG TERM GOALS: Target date: 05/26/2023  Pt will have a decrease in L shoulder pain to 3/10 or less at worst to promote ability to reach, further and more comfortably.  Baseline: 7/10 L anterior shoulder and arm pain at worst for the past 3 months (03/27/2023) Goal status: INITIAL  2.  Pt will improve her L shoulder flexion, and abduction AROM to at least 135 degrees and functional shoulder IR to L thumb to T7 spinous process to promote ability to don  and doff clothing as well as promote ability to reach more comfortably.  Baseline:  Active ROM Right eval Left eval  Shoulder flexion 136 118 (120 AAROM, stiff end range), with reproduction of symptoms.  Shoulder extension    Shoulder abduction 155 120 (132 AAROM, stiff end feel) with reproduction of symptoms.           Shoulder internal rotation (functional) full L thumb to L3 spinous process. With pain  Shoulder external rotation (functional) R 3rd digit to T1 transverse process L 3rd digit to T1 transverse process  (03/27/2023)  Goal status: INITIAL  3.  Pt will  improve her Upper Extremity Functional Index (UEFI) score by at least 8 points as a demonstration of improved function.  Baseline: UEFI score 63/80 (03/27/2023) Goal status: INITIAL  4.  Pt will improve her L shoulder ER, IR, middle and lower trap strength by at least 1/2 MMT to promote ability to raise her arm and reach more comfortably.  Baseline:  MMT Right eval Left eval  Shoulder internal rotation 4+ 4  Shoulder external rotation 4 4  Middle trapezius  3  Lower trapezius   4-  (03/27/2023)  Goal status: INITIAL     PLAN:  PT FREQUENCY: 2x/week  PT DURATION: 8 weeks  PLANNED INTERVENTIONS: 97110-Therapeutic exercises, 97530- Therapeutic activity, 97112- Neuromuscular re-education, 97535- Self Care, 16109- Manual therapy, [id number]- Electrical stimulation (unattended), [phone]- Ionotophoresis 4mg /ml Dexamethasone, Patient/Family education, Dry Needling, and Joint mobilization  PLAN FOR NEXT SESSION: posture, thoracic extension, scapular, ER, IR strengthening, manual techniques, modalities PRN   Jguadalupe Opiela, PT, DPT 03/27/2023, 5:10 PM

## 2023-03-31 ENCOUNTER — Ambulatory Visit
Admission: RE | Admit: 2023-03-31 | Discharge: 2023-03-31 | Disposition: A | Discharge status: Home / Self Care | Payer: Medicare Other | Source: Ambulatory Visit | Attending: Orthopaedic Surgery | Admitting: Orthopaedic Surgery

## 2023-03-31 DIAGNOSIS — R200 Anesthesia of skin: Secondary | ICD-10-CM

## 2023-03-31 DIAGNOSIS — M545 Low back pain, unspecified: Secondary | ICD-10-CM | POA: Diagnosis not present

## 2023-04-05 ENCOUNTER — Ambulatory Visit

## 2023-04-06 NOTE — Therapy (Signed)
 OUTPATIENT PHYSICAL THERAPY TREATMENT   Patient Name: Natalie Sosa MRN: [id number] DOB:11/22/1945, 78 y.o., female Today's Date: 04/07/2023  END OF SESSION:  PT End of Session - 04/07/23 0803     Visit Number 2    Number of Visits 17    Date for PT Re-Evaluation 05/26/23    PT Start Time 0805    PT Stop Time 0847    PT Time Calculation (min) 42 min    Activity Tolerance Patient tolerated treatment well    Behavior During Therapy [facility] for tasks assessed/performed              Past Medical History:  Diagnosis Date   Arthritis    right knee, right thumb   Bright red rectal bleeding 09/01/2021   Cancer (HCC)    skin   COVID-19 12/31/2020   ENDOMETRIOSIS 10/04/2006   Qualifier: History of  By: Hetty Ely MD, [name]    Suprapubic pressure 09/01/2021   Past Surgical History:  Procedure Laterality Date   ACHILLES TENDON REPAIR  06/14/2004   CATARACT EXTRACTION W/PHACO Right 11/09/2022   Procedure: CATARACT EXTRACTION PHACO AND INTRAOCULAR LENS PLACEMENT (IOC) RIGHT TORIC 7.96 00:48.4;  Surgeon: Lockie Mola, MD;  Location: [facility];  Service: Ophthalmology;  Laterality: Right;   CATARACT EXTRACTION W/PHACO Left 12/07/2022   Procedure: CATARACT EXTRACTION PHACO AND INTRAOCULAR LENS PLACEMENT (IOC) LEFT  CLAREON VIVITY TORIC LENS 7.43 00:49.5;  Surgeon: Lockie Mola, MD;  Location: [facility];  Service: Ophthalmology;  Laterality: Left;   CESAREAN SECTION     placenta previa   CHOLECYSTECTOMY  06/13/2001   COLONOSCOPY WITH PROPOFOL N/A 09/24/2021   Procedure: COLONOSCOPY WITH PROPOFOL;  Surgeon: Toney Reil, MD;  Location: [facility] END[facility];  Service: Gastroenterology;  Laterality: N/A;   Patient Active Problem List   Diagnosis Date Noted   Adjustment reaction with anxiety and depression 03/01/2023   Osteoarthritis 02/17/2022   Encounter for annual general medical examination with abnormal findings in adult 02/17/2022   Arthritis  of carpometacarpal [facility]) joint of right thumb 12/28/2021   Polyp of cecum    Unilateral primary osteoarthritis, right knee 09/22/2021   Screen for colon cancer 01/11/2016   Insomnia 01/01/2014   HYPERCHOLESTEROLEMIA 10/04/2006    PCP: Doreene Nest, NP   REFERRING PROVIDER: Kathryne Hitch, MD  REFERRING DIAG: [phone] (ICD-10-CM) - Coracoid impingement of left shoulder    THERAPY DIAG:  Left shoulder pain, unspecified chronicity  Abnormal posture  Stiffness of left shoulder, not elsewhere classified  Rationale for Evaluation and Treatment: Rehabilitation  ONSET DATE: June or July 2024  SUBJECTIVE:  SUBJECTIVE STATEMENT:   Patient reports she received a cortisone injection 2 weeks ago and seems to help.   Hand dominance: Right  PERTINENT HISTORY:  L shoulder pain. Pain is mainly in the front of her arm. Pain began suddenly almost a year ago back in June or July 2024. Unknown mechanism of injury. Pain has stayed about the same since onset. Pt knits a lot, which is the main thing she knows that she does with her L hand. Plays tennis but only uses her L hand to toss the ball up for a serve. Also getting an MRI for her low back this Friday for another issue (R dorsal foot numbness).    No latex allergies. No blood pressure problems per pt.   PAIN:  Are you having pain? Yes: NPRS scale: 0/10 Pain location: L anterior shoulder and arm Pain description: sharp ache.  Aggravating factors: reaching back, putting her hand into a coat sleeve, fixing her hair, unable to don a bra secondary to L shoulder and anterior arm pain Relieving factors: resting position, rubbing that area  PRECAUTIONS: Osteopenia/Osteoporosis  RED FLAGS: Bowel or bladder incontinence: No and Cauda  equina syndrome: No     WEIGHT BEARING RESTRICTIONS: No  FALLS:  Has patient fallen in last 6 months? No  LIVING ENVIRONMENT: Lives with: lives with their spouse Lives in: House/apartment Stairs: No Has following equipment at home: None  OCCUPATION: retired  PLOF: Independent  PATIENT GOALS: Be able to don and doff clothes such as a bra and to be able to sleep on her L side/shoulder.   NEXT MD VISIT: 04/17/2023  OBJECTIVE:  Note: Objective measures were completed at Evaluation unless otherwise noted.  DIAGNOSTIC FINDINGS:  XR Shoulder Left  03/20/2023 Result Narrative  3 views of the left shoulder show no acute findings.  The shoulder is well located.  There is no significant glenohumeral arthritis.  The humeral head is not high riding in the subacromial outlet is well-maintained.      PATIENT SURVEYS:  UEFI score 63/80 (03/27/2023)  COGNITION: Overall cognitive status: Within functional limits for tasks assessed  SENSATION: WFL  POSTURE: forward neck, movement preference around C5/C6 area, and C3/C4 area, B protracted shoulders, R shoulder higher, thoracic kyphosis, R lateral shift   PALPATION: No TTP L shoulder, B upper trap and cervical paraspinal muscle tension.    CERVICAL ROM:   Active ROM A/PROM (deg) eval  Flexion full  Extension WFL  Right lateral flexion WFL  Left lateral flexion WFL  Right rotation WFL  Left rotation WFL   (Blank rows = not tested)  UPPER EXTREMITY ROM:  Active ROM Right eval Left eval  Shoulder flexion 136 118 (120 AAROM, stiff end range), with reproduction of symptoms.  Shoulder extension    Shoulder abduction 155 120 (132 AAROM, stiff end feel) with reproduction of symptoms.   Shoulder adduction    Shoulder extension    Shoulder internal rotation (functional) full L thumb to L3 spinous process. With pain  Shoulder external rotation (functional) R 3rd digit to T1 transverse process L 3rd digit to T1 transverse process   Elbow flexion    Elbow extension    Wrist flexion    Wrist extension    Wrist ulnar deviation    Wrist radial deviation    Wrist pronation    Wrist supination     (Blank rows = not tested)  UPPER EXTREMITY MMT:  MMT Right eval Left eval  Shoulder flexion 4+ 4+  Shoulder extension    Shoulder abduction 4+ 4          Shoulder internal rotation 4+ 4  Shoulder external rotation 4 4  Middle trapezius  3  Lower trapezius   4-  Elbow flexion  4  Elbow extension  4  Wrist flexion    Wrist extension    Wrist ulnar deviation    Wrist radial deviation    Wrist pronation    Wrist supination    Grip strength     (Blank rows = not tested)  SPECIAL TESTS:  (+) Hawkins-Kennedy test (-) empty can test  (+) Neer's impingement.   FUNCTIONAL TESTS:    TREATMENT DATE:  04/07/23:   Supine PROM L shoulder flexion, abduction, ER, and IR x 10 minutes Standing L shoulder isometrics (flexion, abduction, ER, IR) x 10 with 3-5 second hold each motion Standing doorway pec stretch with double arm and single arm 3 x 30 seconds each position set up Supine AAROM L shoulder flexion and abduction with dowel x 10 each direction   HEP handout provided with the above exercises  Supine AROM L shoulder at end of session:  Flexion: 137 deg  Abduction:125 deg  ER: 70 deg    03/27/2023                                                                                                                               Manual therapy   Supine STM L teres major muscle to decrease tension   123 degrees L shoulder flexion AROM afterwards, felt looser per pt.     PATIENT EDUCATION:  Education details: POC Person educated: Patient Education method: Explanation Education comprehension: verbalized understanding  HOME EXERCISE PROGRAM: Access Code: [email or web address] URL: [email or web address] Date: 04/07/2023 Prepared by: Maylon Peppers  Exercises - Standing Isometric Shoulder Flexion with  Doorway - Arm Bent  - 2-3 x daily - 5-7 x weekly - 3 sets - 10 reps - 3-5 sec hold - Standing Isometric Shoulder Abduction with Doorway - Arm Bent  - 2-3 x daily - 5-7 x weekly - 3 sets - 10 reps - 3-5 sec  hold - Standing Isometric Shoulder Internal Rotation at Doorway  - 2-3 x daily - 5-7 x weekly - 3 sets - 10 reps - 3-5 sec hold - Standing Isometric Shoulder External Rotation with Doorway  - 2-3 x daily - 5-7 x weekly - 3 sets - 10 reps - 3-5 sec hold - Supine Shoulder Flexion Extension AAROM with Dowel  - 2-3 x daily - 5-7 x weekly - 3 sets - 10 reps - Supine Shoulder Abduction AAROM with Dowel  - 2-3 x daily - 5-7 x weekly - 3 sets - 10 reps - Single Arm Doorway Pec Stretch at 90 Degrees Abduction  - 2-3 x daily - 5-7 x weekly - 3-5 reps - 30 seconds hold  ASSESSMENT:  CLINICAL IMPRESSION: Patient arrives to treatment session motivated to  participate with no pain reported. Session focused on improving L shoulder ROM with PROM and AAROM as well as initiating HEP with isometrics and doorway pec stretch. Patient demonstrating improved ROM since eval in all directions, see above. HEP handout provided with patient demonstrating good understanding. Patient will continue to benefit from skilled therapy to address remaining deficits in order to improve quality of life and return to PLOF.   OBJECTIVE IMPAIRMENTS: decreased ROM, decreased strength, improper body mechanics, postural dysfunction, and pain.   ACTIVITY LIMITATIONS: lifting, sleeping, bathing, dressing, reach over head, and hygiene/grooming  PARTICIPATION LIMITATIONS:   PERSONAL FACTORS: Age, Time since onset of injury/illness/exacerbation, and 1 comorbidity: arthritis  are also affecting patient's functional outcome.   REHAB POTENTIAL: Fair    CLINICAL DECISION MAKING: Stable/uncomplicated  EVALUATION COMPLEXITY: Low   GOALS: Goals reviewed with patient? Yes  SHORT TERM GOALS: Target date: 04/07/2023  Pt will be independent  with her initial HEP to improve L shoulder AROM, strength, function, and ability to reach more comfortably.  Baseline: Pt has not yet started her initial HEP (03/27/2023) Goal status: INITIAL    LONG TERM GOALS: Target date: 05/26/2023  Pt will have a decrease in L shoulder pain to 3/10 or less at worst to promote ability to reach, further and more comfortably.  Baseline: 7/10 L anterior shoulder and arm pain at worst for the past 3 months (03/27/2023) Goal status: INITIAL  2.  Pt will improve her L shoulder flexion, and abduction AROM to at least 135 degrees and functional shoulder IR to L thumb to T7 spinous process to promote ability to don and doff clothing as well as promote ability to reach more comfortably.  Baseline:  Active ROM Right eval Left eval  Shoulder flexion 136 118 (120 AAROM, stiff end range), with reproduction of symptoms.  Shoulder extension    Shoulder abduction 155 120 (132 AAROM, stiff end feel) with reproduction of symptoms.           Shoulder internal rotation (functional) full L thumb to L3 spinous process. With pain  Shoulder external rotation (functional) R 3rd digit to T1 transverse process L 3rd digit to T1 transverse process  (03/27/2023)  Goal status: INITIAL  3.  Pt will improve her Upper Extremity Functional Index (UEFI) score by at least 8 points as a demonstration of improved function.  Baseline: UEFI score 63/80 (03/27/2023) Goal status: INITIAL  4.  Pt will improve her L shoulder ER, IR, middle and lower trap strength by at least 1/2 MMT to promote ability to raise her arm and reach more comfortably.  Baseline:  MMT Right eval Left eval  Shoulder internal rotation 4+ 4  Shoulder external rotation 4 4  Middle trapezius  3  Lower trapezius   4-  (03/27/2023)  Goal status: INITIAL     PLAN:  PT FREQUENCY: 2x/week  PT DURATION: 8 weeks  PLANNED INTERVENTIONS: 97110-Therapeutic exercises, 97530- Therapeutic activity, 97112- Neuromuscular  re-education, 97535- Self Care, 16109- Manual therapy, G0283- Electrical stimulation (unattended), [phone]- Ionotophoresis 4mg /ml Dexamethasone, Patient/Family education, Dry Needling, and Joint mobilization  PLAN FOR NEXT SESSION: posture, thoracic extension, scapular, ER, IR strengthening, manual techniques, modalities PRN   Viviann Spare, PT, DPT 04/07/2023, 8:03 AM

## 2023-04-07 ENCOUNTER — Ambulatory Visit

## 2023-04-07 DIAGNOSIS — M25612 Stiffness of left shoulder, not elsewhere classified: Secondary | ICD-10-CM

## 2023-04-07 DIAGNOSIS — R293 Abnormal posture: Secondary | ICD-10-CM

## 2023-04-07 DIAGNOSIS — M25512 Pain in left shoulder: Secondary | ICD-10-CM

## 2023-04-07 DIAGNOSIS — R928 Other abnormal and inconclusive findings on diagnostic imaging of breast: Secondary | ICD-10-CM | POA: Diagnosis not present

## 2023-04-10 ENCOUNTER — Ambulatory Visit

## 2023-04-10 DIAGNOSIS — M25512 Pain in left shoulder: Secondary | ICD-10-CM | POA: Diagnosis not present

## 2023-04-10 DIAGNOSIS — M25612 Stiffness of left shoulder, not elsewhere classified: Secondary | ICD-10-CM

## 2023-04-10 DIAGNOSIS — R293 Abnormal posture: Secondary | ICD-10-CM | POA: Diagnosis not present

## 2023-04-10 DIAGNOSIS — R928 Other abnormal and inconclusive findings on diagnostic imaging of breast: Secondary | ICD-10-CM | POA: Diagnosis not present

## 2023-04-10 NOTE — Therapy (Signed)
 OUTPATIENT PHYSICAL THERAPY TREATMENT   Patient Name: Natalie Sosa MRN: [id number] DOB:12-12-1945, 78 y.o., female Today's Date: 04/10/2023  END OF SESSION:  PT End of Session - 04/10/23 1512     Visit Number 3    Number of Visits 17    Date for PT Re-Evaluation 05/26/23    PT Start Time 1512    PT Stop Time 1556    PT Time Calculation (min) 44 min    Activity Tolerance Patient tolerated treatment well    Behavior During Therapy [facility] for tasks assessed/performed               Past Medical History:  Diagnosis Date   Arthritis    right knee, right thumb   Bright red rectal bleeding 09/01/2021   Cancer (HCC)    skin   COVID-19 12/31/2020   ENDOMETRIOSIS 10/04/2006   Qualifier: History of  By: Hetty Ely MD, [name]    Suprapubic pressure 09/01/2021   Past Surgical History:  Procedure Laterality Date   ACHILLES TENDON REPAIR  06/14/2004   CATARACT EXTRACTION W/PHACO Right 11/09/2022   Procedure: CATARACT EXTRACTION PHACO AND INTRAOCULAR LENS PLACEMENT (IOC) RIGHT TORIC 7.96 00:48.4;  Surgeon: Lockie Mola, MD;  Location: [facility];  Service: Ophthalmology;  Laterality: Right;   CATARACT EXTRACTION W/PHACO Left 12/07/2022   Procedure: CATARACT EXTRACTION PHACO AND INTRAOCULAR LENS PLACEMENT (IOC) LEFT  CLAREON VIVITY TORIC LENS 7.43 00:49.5;  Surgeon: Lockie Mola, MD;  Location: [facility];  Service: Ophthalmology;  Laterality: Left;   CESAREAN SECTION     placenta previa   CHOLECYSTECTOMY  06/13/2001   COLONOSCOPY WITH PROPOFOL N/A 09/24/2021   Procedure: COLONOSCOPY WITH PROPOFOL;  Surgeon: Toney Reil, MD;  Location: [facility] ENDOSCOPY;  Service: Gastroenterology;  Laterality: N/A;   Patient Active Problem List   Diagnosis Date Noted   Adjustment reaction with anxiety and depression 03/01/2023   Osteoarthritis 02/17/2022   Encounter for annual general medical examination with abnormal findings in adult 02/17/2022    Arthritis of carpometacarpal [facility]) joint of right thumb 12/28/2021   Polyp of cecum    Unilateral primary osteoarthritis, right knee 09/22/2021   Screen for colon cancer 01/11/2016   Insomnia 01/01/2014   HYPERCHOLESTEROLEMIA 10/04/2006    PCP: Doreene Nest, NP   REFERRING PROVIDER: Kathryne Hitch, MD  REFERRING DIAG: [phone] (ICD-10-CM) - Coracoid impingement of left shoulder    THERAPY DIAG:  Left shoulder pain, unspecified chronicity  Abnormal posture  Stiffness of left shoulder, not elsewhere classified  Rationale for Evaluation and Treatment: Rehabilitation  ONSET DATE: June or July 2024  SUBJECTIVE:  SUBJECTIVE STATEMENT:   L shoulder is a little better, the exercises might be loosening things up. No pain currently.      Hand dominance: Right  PERTINENT HISTORY:  L shoulder pain. Pain is mainly in the front of her arm. Pain began suddenly almost a year ago back in June or July 2024. Unknown mechanism of injury. Pain has stayed about the same since onset. Pt knits a lot, which is the main thing she knows that she does with her L hand. Plays tennis but only uses her L hand to toss the ball up for a serve. Also getting an MRI for her low back this Friday for another issue (R dorsal foot numbness).    No latex allergies. No blood pressure problems per pt.   PAIN:  Are you having pain? Yes: NPRS scale: 0/10 Pain location: L anterior shoulder and arm Pain description: sharp ache.  Aggravating factors: reaching back, putting her hand into a coat sleeve, fixing her hair, unable to don a bra secondary to L shoulder and anterior arm pain Relieving factors: resting position, rubbing that area  PRECAUTIONS: Osteopenia/Osteoporosis  RED FLAGS: Bowel or bladder  incontinence: No and Cauda equina syndrome: No     WEIGHT BEARING RESTRICTIONS: No  FALLS:  Has patient fallen in last 6 months? No  LIVING ENVIRONMENT: Lives with: lives with their spouse Lives in: House/apartment Stairs: No Has following equipment at home: None  OCCUPATION: retired  PLOF: Independent  PATIENT GOALS: Be able to don and doff clothes such as a bra and to be able to sleep on her L side/shoulder.   NEXT MD VISIT: 04/17/2023  OBJECTIVE:  Note: Objective measures were completed at Evaluation unless otherwise noted.  DIAGNOSTIC FINDINGS:  XR Shoulder Left  03/20/2023 Result Narrative  3 views of the left shoulder show no acute findings.  The shoulder is well located.  There is no significant glenohumeral arthritis.  The humeral head is not high riding in the subacromial outlet is well-maintained.      PATIENT SURVEYS:  UEFI score 63/80 (03/27/2023)  COGNITION: Overall cognitive status: Within functional limits for tasks assessed  SENSATION: WFL  POSTURE: forward neck, movement preference around C5/C6 area, and C3/C4 area, B protracted shoulders, R shoulder higher, thoracic kyphosis, R lateral shift   PALPATION: No TTP L shoulder, B upper trap and cervical paraspinal muscle tension.    CERVICAL ROM:   Active ROM A/PROM (deg) eval  Flexion full  Extension WFL  Right lateral flexion WFL  Left lateral flexion WFL  Right rotation WFL  Left rotation WFL   (Blank rows = not tested)  UPPER EXTREMITY ROM:  Active ROM Right eval Left eval  Shoulder flexion 136 118 (120 AAROM, stiff end range), with reproduction of symptoms.  Shoulder extension    Shoulder abduction 155 120 (132 AAROM, stiff end feel) with reproduction of symptoms.   Shoulder adduction    Shoulder extension    Shoulder internal rotation (functional) full L thumb to L3 spinous process. With pain  Shoulder external rotation (functional) R 3rd digit to T1 transverse process L 3rd  digit to T1 transverse process  Elbow flexion    Elbow extension    Wrist flexion    Wrist extension    Wrist ulnar deviation    Wrist radial deviation    Wrist pronation    Wrist supination     (Blank rows = not tested)  UPPER EXTREMITY MMT:  MMT Right eval Left eval  Shoulder flexion 4+ 4+  Shoulder extension    Shoulder abduction 4+ 4          Shoulder internal rotation 4+ 4  Shoulder external rotation 4 4  Middle trapezius  3  Lower trapezius   4-  Elbow flexion  4  Elbow extension  4  Wrist flexion    Wrist extension    Wrist ulnar deviation    Wrist radial deviation    Wrist pronation    Wrist supination    Grip strength     (Blank rows = not tested)  SPECIAL TESTS:  (+) Hawkins-Kennedy test (-) empty can test  (+) Neer's impingement.   FUNCTIONAL TESTS:    TREATMENT DATE:  04/10/23  Manual therapy    Supine STM L teres major muscle to decrease tension             Therapeutic exercise  Supine PROM L shoulder flexion, scaption and IR (in scapular plane) 10x5 seconds each way  Seated L triceps extension isometrics 10x3 with 5 second holds  Standing B shoulder ER yellow band 10x5 seconds for 2 sets  Standing L shoulder extension yellow band with scapular retraction 10x5 seconds   Towel slides shoulder AAROM   Scaption 10x5 seconds for 2 sets  Standing L shoulder IR isometrics, hand on abdomen pressing on folded towel 10x5 seconds for 3 sets to promote posterior glide of humeral head   Supine AROM L shoulder at end of session:  Flexion: 128 deg  Abduction:134 deg  L shoulder functional IR after session: L thumb to L1 spinous process   Improved exercise technique, movement at target joints, use of target muscles after mod verbal, visual, tactile cues.    PATIENT EDUCATION:  Education details: POC Person educated: Patient Education method: Explanation Education comprehension: verbalized understanding  HOME EXERCISE PROGRAM: Access  Code: [email or web address] URL: [email or web address] Date: 04/07/2023 Prepared by: Maylon Peppers  Exercises - Standing Isometric Shoulder Flexion with Doorway - Arm Bent  - 2-3 x daily - 5-7 x weekly - 3 sets - 10 reps - 3-5 sec hold - Standing Isometric Shoulder Abduction with Doorway - Arm Bent  - 2-3 x daily - 5-7 x weekly - 3 sets - 10 reps - 3-5 sec  hold - Standing Isometric Shoulder Internal Rotation at Doorway  - 2-3 x daily - 5-7 x weekly - 3 sets - 10 reps - 3-5 sec hold - Standing Isometric Shoulder External Rotation with Doorway  - 2-3 x daily - 5-7 x weekly - 3 sets - 10 reps - 3-5 sec hold - Supine Shoulder Flexion Extension AAROM with Dowel  - 2-3 x daily - 5-7 x weekly - 3 sets - 10 reps - Supine Shoulder Abduction AAROM with Dowel  - 2-3 x daily - 5-7 x weekly - 3 sets - 10 reps - Single Arm Doorway Pec Stretch at 90 Degrees Abduction  - 2-3 x daily - 5-7 x weekly - 3-5 reps - 30 seconds hold   - Isometric Tricep Extension   - 1 x daily - 7 x weekly - 3 sets - 10 reps - 5 seconds hold     ASSESSMENT:  CLINICAL IMPRESSION:  Worked on decreasing muscle tension to L shoulder as well as promoting posterior and inferior glide of her humeral head to decrease anterior shoulder impingement when reaching. Improved L shoulder functional IR and abduction AROM after treatment. Pt tolerated session well without aggravation of symptoms. Pt will benefit from continued skilled  physical therapy services to decrease pain, stiffness, improve ROM, strength, and function.     OBJECTIVE IMPAIRMENTS: decreased ROM, decreased strength, improper body mechanics, postural dysfunction, and pain.   ACTIVITY LIMITATIONS: lifting, sleeping, bathing, dressing, reach over head, and hygiene/grooming  PARTICIPATION LIMITATIONS:   PERSONAL FACTORS: Age, Time since onset of injury/illness/exacerbation, and 1 comorbidity: arthritis  are also affecting patient's functional outcome.   REHAB  POTENTIAL: Fair    CLINICAL DECISION MAKING: Stable/uncomplicated  EVALUATION COMPLEXITY: Low   GOALS: Goals reviewed with patient? Yes  SHORT TERM GOALS: Target date: 04/07/2023  Pt will be independent with her initial HEP to improve L shoulder AROM, strength, function, and ability to reach more comfortably.  Baseline: Pt has not yet started her initial HEP (03/27/2023) Goal status: INITIAL    LONG TERM GOALS: Target date: 05/26/2023  Pt will have a decrease in L shoulder pain to 3/10 or less at worst to promote ability to reach, further and more comfortably.  Baseline: 7/10 L anterior shoulder and arm pain at worst for the past 3 months (03/27/2023) Goal status: INITIAL  2.  Pt will improve her L shoulder flexion, and abduction AROM to at least 135 degrees and functional shoulder IR to L thumb to T7 spinous process to promote ability to don and doff clothing as well as promote ability to reach more comfortably.  Baseline:  Active ROM Right eval Left eval  Shoulder flexion 136 118 (120 AAROM, stiff end range), with reproduction of symptoms.  Shoulder extension    Shoulder abduction 155 120 (132 AAROM, stiff end feel) with reproduction of symptoms.           Shoulder internal rotation (functional) full L thumb to L3 spinous process. With pain  Shoulder external rotation (functional) R 3rd digit to T1 transverse process L 3rd digit to T1 transverse process  (03/27/2023)  Goal status: INITIAL  3.  Pt will improve her Upper Extremity Functional Index (UEFI) score by at least 8 points as a demonstration of improved function.  Baseline: UEFI score 63/80 (03/27/2023) Goal status: INITIAL  4.  Pt will improve her L shoulder ER, IR, middle and lower trap strength by at least 1/2 MMT to promote ability to raise her arm and reach more comfortably.  Baseline:  MMT Right eval Left eval  Shoulder internal rotation 4+ 4  Shoulder external rotation 4 4  Middle trapezius  3  Lower trapezius    4-  (03/27/2023)  Goal status: INITIAL     PLAN:  PT FREQUENCY: 2x/week  PT DURATION: 8 weeks  PLANNED INTERVENTIONS: 97110-Therapeutic exercises, 97530- Therapeutic activity, 97112- Neuromuscular re-education, 97535- Self Care, 40981- Manual therapy, G0283- Electrical stimulation (unattended), [phone]- Ionotophoresis 4mg /ml Dexamethasone, Patient/Family education, Dry Needling, and Joint mobilization  PLAN FOR NEXT SESSION: posture, thoracic extension, scapular, ER, IR strengthening, manual techniques, modalities PRN   Florence Antonelli, PT, DPT 04/10/2023, 4:04 PM

## 2023-04-12 ENCOUNTER — Ambulatory Visit (INDEPENDENT_AMBULATORY_CARE_PROVIDER_SITE_OTHER): Payer: Medicare Other | Admitting: Primary Care

## 2023-04-12 ENCOUNTER — Encounter: Payer: Self-pay | Admitting: Primary Care

## 2023-04-12 ENCOUNTER — Ambulatory Visit

## 2023-04-12 VITALS — BP 102/56 | HR 70 | Temp 97.3°F | Ht 66.0 in | Wt 168.0 lb

## 2023-04-12 DIAGNOSIS — F4323 Adjustment disorder with mixed anxiety and depressed mood: Secondary | ICD-10-CM

## 2023-04-12 DIAGNOSIS — R293 Abnormal posture: Secondary | ICD-10-CM

## 2023-04-12 DIAGNOSIS — M25612 Stiffness of left shoulder, not elsewhere classified: Secondary | ICD-10-CM | POA: Diagnosis not present

## 2023-04-12 DIAGNOSIS — R928 Other abnormal and inconclusive findings on diagnostic imaging of breast: Secondary | ICD-10-CM | POA: Diagnosis not present

## 2023-04-12 DIAGNOSIS — M25512 Pain in left shoulder: Secondary | ICD-10-CM | POA: Diagnosis not present

## 2023-04-12 MED ORDER — SERTRALINE HCL 50 MG PO TABS: 50.0000 mg | ORAL_TABLET | Freq: Every day | ORAL | 0 refills | Status: DC

## 2023-04-12 NOTE — Assessment & Plan Note (Addendum)
 Slightly improved, not at goal.  Increase Zoloft to 50 mg and move to HS. She agrees. She will update via MyChart in 3-4 weeks.   Consider switching to Lexapro if no improvement.

## 2023-04-12 NOTE — Patient Instructions (Signed)
 We increased your Zoloft to 50 mg for anxiety/depression. Start taking this at bedtime.   Please update me in 3-4 weeks.  It was a pleasure to see you today!

## 2023-04-12 NOTE — Progress Notes (Signed)
 Subjective:    Patient ID: Natalie Sosa, female    DOB: [birth year], 78 y.o.   MRN: [id number]  HPI  Natalie Sosa is a very pleasant 78 y.o. female with a history of hyperlipidemia, anxiety depression, osteoarthritis who presents today for follow-up of anxiety and depression.   She was last evaluated on 03/01/2023 for her annual physical when she discussed ongoing symptoms of anxiety and depression that have become worse over the last 6 months.  Symptoms included worrying, feeling irritable, feeling down, frustration due to lack of physical ability to exercise.  We discussed options for treatment and she opted for medication.  She was initiated on Zoloft 25 mg daily.  She is here for follow-up today.  Since her last visit she's feeling slightly improved. She's not as tearful as she was and is feeling less down. She has noticed ongoing headaches which are chronic, mostly originate to the base of the neck and is in physical therapy.   She continues to feel irritable and worry. She denies SI/HI. She has found that she's more drowsy since starting Zoloft, has been taking them in the morning.   Review of Systems  Respiratory:  Negative for shortness of breath.   Cardiovascular:  Negative for chest pain.  Gastrointestinal:  Negative for abdominal pain, constipation and diarrhea.  Psychiatric/Behavioral:  Negative for suicidal ideas. The patient is nervous/anxious.          Past Medical History:  Diagnosis Date   Arthritis    right knee, right thumb   Bright red rectal bleeding 09/01/2021   Cancer (HCC)    skin   COVID-19 12/31/2020   ENDOMETRIOSIS 10/04/2006   Qualifier: History of  By: Hetty Ely MD, Natalie Sosa    Suprapubic pressure 09/01/2021    Social History   Socioeconomic History   Marital status: Married    Spouse name: Not on file   Number of children: 1   Years of education: Not on file   Highest education level: Bachelor's degree (e.g., BA, AB, BS)  Occupational  History   Not on file  Tobacco Use   Smoking status: Never   Smokeless tobacco: Never  Vaping Use   Vaping status: Never Used  Substance and Sexual Activity   Alcohol use: Yes    Comment: glass of wine occ   Drug use: Never   Sexual activity: Yes  Other Topics Concern   Not on file  Social History Narrative   Husband Elijah Birk, aware of her end of life wishes.  She would desire CPR, no feedings tubes.   Social Drivers of Corporate investment banker Strain: Low Risk  (02/24/2023)   Overall Financial Resource Strain (CARDIA)    Difficulty of Paying Living Expenses: Not hard at all  Food Insecurity: No Food Insecurity (02/24/2023)   Hunger Vital Sign    Worried About Running Out of Food in the Last Year: Never true    Ran Out of Food in the Last Year: Never true  Transportation Needs: No Transportation Needs (02/24/2023)   PRAPARE - Administrator, Civil Service (Medical): No    Lack of Transportation (Non-Medical): No  Physical Activity: Sufficiently Active (02/24/2023)   Exercise Vital Sign    Days of Exercise per Week: 2 days    Minutes of Exercise per Session: 90 min  Stress: Stress Concern Present (02/24/2023)   [employer] of Occupational Health - Occupational Stress Questionnaire    Feeling of Stress : To  some extent  Social Connections: Unknown (02/24/2023)   Social Connection and Isolation Panel [NHANES]    Frequency of Communication with Friends and Family: More than three times a week    Frequency of Social Gatherings with Friends and Family: Three times a week    Attends Religious Services: Patient declined    Active Member of Clubs or Organizations: Yes    Attends Banker Meetings: More than 4 times per year    Marital Status: Married  Catering manager Violence: Not At Risk (01/27/2023)   Humiliation, Afraid, Rape, and Kick questionnaire    Fear of Current or Ex-Partner: No    Emotionally Abused: No    Physically Abused: No    Sexually  Abused: No    Past Surgical History:  Procedure Laterality Date   ACHILLES TENDON REPAIR  06/14/2004   CATARACT EXTRACTION W/PHACO Right 11/09/2022   Procedure: CATARACT EXTRACTION PHACO AND INTRAOCULAR LENS PLACEMENT (IOC) RIGHT TORIC 7.96 00:48.4;  Surgeon: Lockie Mola, MD;  Location: [facility];  Service: Ophthalmology;  Laterality: Right;   CATARACT EXTRACTION W/PHACO Left 12/07/2022   Procedure: CATARACT EXTRACTION PHACO AND INTRAOCULAR LENS PLACEMENT (IOC) LEFT  CLAREON VIVITY TORIC LENS 7.43 00:49.5;  Surgeon: Lockie Mola, MD;  Location: [facility];  Service: Ophthalmology;  Laterality: Left;   CESAREAN SECTION     placenta previa   CHOLECYSTECTOMY  06/13/2001   COLONOSCOPY WITH PROPOFOL N/A 09/24/2021   Procedure: COLONOSCOPY WITH PROPOFOL;  Surgeon: Toney Reil, MD;  Location: [facility];  Service: Gastroenterology;  Laterality: N/A;    Family History  Problem Relation Age of Onset   Heart disease Mother    Dementia Mother    Cancer Father        Pancreatic   Stroke Father    Breast cancer Paternal Grandmother 62   Breast cancer Paternal Aunt     Allergies  Allergen Reactions   Ezetimibe-Simvastatin     REACTION: Achey   Hydrocodone     REACTION: Itchy, rash   Prednisone     REACTION: anxiety, hyper    Current Outpatient Medications on File Prior to Visit  Medication Sig Dispense Refill   CALCIUM-VITAMIN D PO Take 2 tablets by mouth daily.     diphenhydrAMINE (BENADRYL) 25 mg capsule Take 25 mg by mouth 2 (two) times daily as needed.     glucosamine-chondroitin 500-400 MG tablet Take 1 tablet by mouth in the morning and at bedtime. 1500-1200     Multiple Vitamin (MULTIVITAMIN) tablet Take 1 tablet by mouth daily.     simvastatin (ZOCOR) 20 MG tablet TAKE 1 TABLET(20 MG) BY MOUTH AT BEDTIME for cholesterol. 90 tablet 2   ibuprofen (ADVIL,MOTRIN) 200 MG tablet Use as directed (Patient not taking: Reported on 04/12/2023)      No current facility-administered medications on file prior to visit.    BP (!) 102/56   Pulse 70   Temp (!) 97.3 F (36.3 C) (Temporal)   Ht 5\' 6"  (1.676 m)   Wt 168 lb (76.2 kg)   SpO2 97%   BMI 27.12 kg/m  Objective:   Physical Exam Cardiovascular:     Rate and Rhythm: Normal rate and regular rhythm.  Pulmonary:     Effort: Pulmonary effort is normal.     Breath sounds: Normal breath sounds.  Musculoskeletal:     Cervical back: Neck supple.  Skin:    General: Skin is warm and dry.  Neurological:     Mental  Status: She is alert and oriented to person, place, and time.  Psychiatric:        Mood and Affect: Mood normal.           Assessment & Plan:  Adjustment reaction with anxiety and depression Assessment & Plan: Slightly improved, not at goal.  Increase Zoloft to 50 mg and move to HS. She agrees. She will update via MyChart in 3-4 weeks.   Consider switching to Lexapro if no improvement.    Orders: -     Sertraline HCl; Take 1 tablet (50 mg total) by mouth daily. for anxiety and depression.  Dispense: 90 tablet; Refill: 0        Doreene Nest, NP

## 2023-04-12 NOTE — Therapy (Signed)
 OUTPATIENT PHYSICAL THERAPY TREATMENT   Patient Name: Natalie Sosa MRN: [id number] DOB:08/09/1945, 78 y.o., female Today's Date: 04/12/2023  END OF SESSION:  PT End of Session - 04/12/23 0938     Visit Number 4    Number of Visits 17    Date for PT Re-Evaluation 05/26/23    PT Start Time 0938    PT Stop Time 1018    PT Time Calculation (min) 40 min    Activity Tolerance Patient tolerated treatment well    Behavior During Therapy [facility] for tasks assessed/performed                Past Medical History:  Diagnosis Date   Arthritis    right knee, right thumb   Bright red rectal bleeding 09/01/2021   Cancer (HCC)    skin   COVID-19 12/31/2020   ENDOMETRIOSIS 10/04/2006   Qualifier: History of  By: Hetty Ely MD, [name]    Suprapubic pressure 09/01/2021   Past Surgical History:  Procedure Laterality Date   ACHILLES TENDON REPAIR  06/14/2004   CATARACT EXTRACTION W/PHACO Right 11/09/2022   Procedure: CATARACT EXTRACTION PHACO AND INTRAOCULAR LENS PLACEMENT (IOC) RIGHT TORIC 7.96 00:48.4;  Surgeon: Lockie Mola, MD;  Location: [facility];  Service: Ophthalmology;  Laterality: Right;   CATARACT EXTRACTION W/PHACO Left 12/07/2022   Procedure: CATARACT EXTRACTION PHACO AND INTRAOCULAR LENS PLACEMENT (IOC) LEFT  CLAREON VIVITY TORIC LENS 7.43 00:49.5;  Surgeon: Lockie Mola, MD;  Location: [facility];  Service: Ophthalmology;  Laterality: Left;   CESAREAN SECTION     placenta previa   CHOLECYSTECTOMY  06/13/2001   COLONOSCOPY WITH PROPOFOL N/A 09/24/2021   Procedure: COLONOSCOPY WITH PROPOFOL;  Surgeon: Toney Reil, MD;  Location: [facility] END[facility]COPY;  Service: Gastroenterology;  Laterality: N/A;   Patient Active Problem List   Diagnosis Date Noted   Adjustment reaction with anxiety and depression 03/01/2023   Osteoarthritis 02/17/2022   Encounter for annual general medical examination with abnormal findings in adult 02/17/2022    Arthritis of carpometacarpal [facility]) joint of right thumb 12/28/2021   Polyp of cecum    Unilateral primary osteoarthritis, right knee 09/22/2021   Screen for colon cancer 01/11/2016   Insomnia 01/01/2014   HYPERCHOLESTEROLEMIA 10/04/2006    PCP: Doreene Nest, NP   REFERRING PROVIDER: Kathryne Hitch, MD  REFERRING DIAG: [phone] (ICD-10-CM) - Coracoid impingement of left shoulder    THERAPY DIAG:  Left shoulder pain, unspecified chronicity  Abnormal posture  Stiffness of left shoulder, not elsewhere classified  Rationale for Evaluation and Treatment: Rehabilitation  ONSET DATE: June or July 2024  SUBJECTIVE:  SUBJECTIVE STATEMENT:   L shoulder is not too bad. No pain currently      Hand dominance: Right  PERTINENT HISTORY:  L shoulder pain. Pain is mainly in the front of her arm. Pain began suddenly almost a year ago back in June or July 2024. Unknown mechanism of injury. Pain has stayed about the same since onset. Pt knits a lot, which is the main thing she knows that she does with her L hand. Plays tennis but only uses her L hand to toss the ball up for a serve. Also getting an MRI for her low back this Friday for another issue (R dorsal foot numbness).    No latex allergies. No blood pressure problems per pt.  Has osteopenia/osteoporosis   PAIN:  Are you having pain? Yes: NPRS scale: 0/10 Pain location: L anterior shoulder and arm Pain description: sharp ache.  Aggravating factors: reaching back, putting her hand into a coat sleeve, fixing her hair, unable to don a bra secondary to L shoulder and anterior arm pain Relieving factors: resting position, rubbing that area  PRECAUTIONS: Osteopenia/Osteoporosis  RED FLAGS: Bowel or bladder incontinence: No  and Cauda equina syndrome: No     WEIGHT BEARING RESTRICTIONS: No  FALLS:  Has patient fallen in last 6 months? No  LIVING ENVIRONMENT: Lives with: lives with their spouse Lives in: House/apartment Stairs: No Has following equipment at home: None  OCCUPATION: retired  PLOF: Independent  PATIENT GOALS: Be able to don and doff clothes such as a bra and to be able to sleep on her L side/shoulder.   NEXT MD VISIT: 04/17/2023  OBJECTIVE:  Note: Objective measures were completed at Evaluation unless otherwise noted.  DIAGNOSTIC FINDINGS:  XR Shoulder Left  03/20/2023 Result Narrative  3 views of the left shoulder show no acute findings.  The shoulder is well located.  There is no significant glenohumeral arthritis.  The humeral head is not high riding in the subacromial outlet is well-maintained.      PATIENT SURVEYS:  UEFI score 63/80 (03/27/2023)  COGNITION: Overall cognitive status: Within functional limits for tasks assessed  SENSATION: WFL  POSTURE: forward neck, movement preference around C5/C6 area, and C3/C4 area, B protracted shoulders, R shoulder higher, thoracic kyphosis, R lateral shift   PALPATION: No TTP L shoulder, B upper trap and cervical paraspinal muscle tension.    CERVICAL ROM:   Active ROM A/PROM (deg) eval  Flexion full  Extension WFL  Right lateral flexion WFL  Left lateral flexion WFL  Right rotation WFL  Left rotation WFL   (Blank rows = not tested)  UPPER EXTREMITY ROM:  Active ROM Right eval Left eval  Shoulder flexion 136 118 (120 AAROM, stiff end range), with reproduction of symptoms.  Shoulder extension    Shoulder abduction 155 120 (132 AAROM, stiff end feel) with reproduction of symptoms.   Shoulder adduction    Shoulder extension    Shoulder internal rotation (functional) full L thumb to L3 spinous process. With pain  Shoulder external rotation (functional) R 3rd digit to T1 transverse process L 3rd digit to T1  transverse process  Elbow flexion    Elbow extension    Wrist flexion    Wrist extension    Wrist ulnar deviation    Wrist radial deviation    Wrist pronation    Wrist supination     (Blank rows = not tested)  UPPER EXTREMITY MMT:  MMT Right eval Left eval  Shoulder flexion 4+ 4+  Shoulder extension    Shoulder abduction 4+ 4          Shoulder internal rotation 4+ 4  Shoulder external rotation 4 4  Middle trapezius  3  Lower trapezius   4-  Elbow flexion  4  Elbow extension  4  Wrist flexion    Wrist extension    Wrist ulnar deviation    Wrist radial deviation    Wrist pronation    Wrist supination    Grip strength     (Blank rows = not tested)  SPECIAL TESTS:  (+) Hawkins-Kennedy test (-) empty can test  (+) Neer's impingement.   FUNCTIONAL TESTS:    TREATMENT DATE:  04/12/23  Therapeutic exercise  Seated L shoulder inferior glide self mob 10x5 seconds for 3 sets  Seated L triceps extension isometrics facing the table 10x5 seconds   Standing B shoulder ER yellow band 10x5 seconds for 3 sets  Pulley L UE AAROM   Flexion 10x5 seconds for 2 sets   Scaption 10x5 seconds for 2 sets  Seated with L arm propped into flexion  L triceps extension green band 10x5 seconds for 3 sets  Towel slides shoulder AAROM at wall  Scaption 10x5 seconds       Improved exercise technique, movement at target joints, use of target muscles after mod verbal, visual, tactile cues.    PATIENT EDUCATION:  Education details: POC Person educated: Patient Education method: Explanation Education comprehension: verbalized understanding  HOME EXERCISE PROGRAM: Access Code: [email or web address] URL: [email or web address] Date: 04/07/2023 Prepared by: Maylon Peppers  Exercises - Standing Isometric Shoulder Flexion with Doorway - Arm Bent  - 2-3 x daily - 5-7 x weekly - 3 sets - 10 reps - 3-5 sec hold - Standing Isometric Shoulder Abduction with Doorway - Arm Bent  -  2-3 x daily - 5-7 x weekly - 3 sets - 10 reps - 3-5 sec  hold - Standing Isometric Shoulder Internal Rotation at Doorway  - 2-3 x daily - 5-7 x weekly - 3 sets - 10 reps - 3-5 sec hold - Standing Isometric Shoulder External Rotation with Doorway  - 2-3 x daily - 5-7 x weekly - 3 sets - 10 reps - 3-5 sec hold - Supine Shoulder Flexion Extension AAROM with Dowel  - 2-3 x daily - 5-7 x weekly - 3 sets - 10 reps - Supine Shoulder Abduction AAROM with Dowel  - 2-3 x daily - 5-7 x weekly - 3 sets - 10 reps - Single Arm Doorway Pec Stretch at 90 Degrees Abduction  - 2-3 x daily - 5-7 x weekly - 3-5 reps - 30 seconds hold   - Isometric Tricep Extension   - 1 x daily - 7 x weekly - 3 sets - 10 reps - 5 seconds hold     ASSESSMENT:  CLINICAL IMPRESSION:  Continued working on improving inferior and posterior L humeral head movement to decrease stress to anterior shoulder. Good posterior and inferior glide of humeral head palpated during triceps exercises.  Pt tolerated session well without aggravation of symptoms. Pt will benefit from continued skilled physical therapy services to decrease pain, stiffness, improve ROM, strength, and function.     OBJECTIVE IMPAIRMENTS: decreased ROM, decreased strength, improper body mechanics, postural dysfunction, and pain.   ACTIVITY LIMITATIONS: lifting, sleeping, bathing, dressing, reach over head, and hygiene/grooming  PARTICIPATION LIMITATIONS:   PERSONAL FACTORS: Age, Time since onset of injury/illness/exacerbation, and 1 comorbidity: arthritis  are also affecting patient's functional outcome.  REHAB POTENTIAL: Fair    CLINICAL DECISION MAKING: Stable/uncomplicated  EVALUATION COMPLEXITY: Low   GOALS: Goals reviewed with patient? Yes  SHORT TERM GOALS: Target date: 04/07/2023  Pt will be independent with her initial HEP to improve L shoulder AROM, strength, function, and ability to reach more comfortably.  Baseline: Pt has not yet started her  initial HEP (03/27/2023) Goal status: INITIAL    LONG TERM GOALS: Target date: 05/26/2023  Pt will have a decrease in L shoulder pain to 3/10 or less at worst to promote ability to reach, further and more comfortably.  Baseline: 7/10 L anterior shoulder and arm pain at worst for the past 3 months (03/27/2023) Goal status: INITIAL  2.  Pt will improve her L shoulder flexion, and abduction AROM to at least 135 degrees and functional shoulder IR to L thumb to T7 spinous process to promote ability to don and doff clothing as well as promote ability to reach more comfortably.  Baseline:  Active ROM Right eval Left eval  Shoulder flexion 136 118 (120 AAROM, stiff end range), with reproduction of symptoms.  Shoulder extension    Shoulder abduction 155 120 (132 AAROM, stiff end feel) with reproduction of symptoms.           Shoulder internal rotation (functional) full L thumb to L3 spinous process. With pain  Shoulder external rotation (functional) R 3rd digit to T1 transverse process L 3rd digit to T1 transverse process  (03/27/2023)  Goal status: INITIAL  3.  Pt will improve her Upper Extremity Functional Index (UEFI) score by at least 8 points as a demonstration of improved function.  Baseline: UEFI score 63/80 (03/27/2023) Goal status: INITIAL  4.  Pt will improve her L shoulder ER, IR, middle and lower trap strength by at least 1/2 MMT to promote ability to raise her arm and reach more comfortably.  Baseline:  MMT Right eval Left eval  Shoulder internal rotation 4+ 4  Shoulder external rotation 4 4  Middle trapezius  3  Lower trapezius   4-  (03/27/2023)  Goal status: INITIAL     PLAN:  PT FREQUENCY: 2x/week  PT DURATION: 8 weeks  PLANNED INTERVENTIONS: 97110-Therapeutic exercises, 97530- Therapeutic activity, 97112- Neuromuscular re-education, 97535- Self Care, 65784- Manual therapy, G0283- Electrical stimulation (unattended), [phone]- Ionotophoresis 4mg /ml Dexamethasone,  Patient/Family education, Dry Needling, and Joint mobilization  PLAN FOR NEXT SESSION: posture, thoracic extension, scapular, ER, IR strengthening, manual techniques, modalities PRN   Miroslav Gin, PT, DPT 04/12/2023, 10:27 AM

## 2023-04-13 ENCOUNTER — Ambulatory Visit
Admission: RE | Admit: 2023-04-13 | Discharge: 2023-04-13 | Disposition: A | Discharge status: Home / Self Care | Payer: Medicare Other | Source: Ambulatory Visit | Attending: Primary Care

## 2023-04-13 ENCOUNTER — Ambulatory Visit
Admission: RE | Admit: 2023-04-13 | Discharge: 2023-04-13 | Disposition: A | Discharge status: Home / Self Care | Payer: Medicare Other | Source: Ambulatory Visit | Attending: Primary Care | Admitting: Primary Care

## 2023-04-13 DIAGNOSIS — Z780 Asymptomatic menopausal state: Secondary | ICD-10-CM | POA: Diagnosis not present

## 2023-04-13 DIAGNOSIS — Z1231 Encounter for screening mammogram for malignant neoplasm of breast: Secondary | ICD-10-CM

## 2023-04-13 DIAGNOSIS — M8589 Other specified disorders of bone density and structure, multiple sites: Secondary | ICD-10-CM | POA: Diagnosis not present

## 2023-04-13 DIAGNOSIS — E2839 Other primary ovarian failure: Secondary | ICD-10-CM | POA: Insufficient documentation

## 2023-04-17 ENCOUNTER — Encounter

## 2023-04-17 ENCOUNTER — Encounter: Payer: Self-pay | Admitting: Orthopaedic Surgery

## 2023-04-17 ENCOUNTER — Other Ambulatory Visit: Payer: Self-pay | Admitting: Primary Care

## 2023-04-17 ENCOUNTER — Ambulatory Visit: Payer: Medicare Other | Admitting: Orthopaedic Surgery

## 2023-04-17 DIAGNOSIS — R928 Other abnormal and inconclusive findings on diagnostic imaging of breast: Secondary | ICD-10-CM

## 2023-04-17 DIAGNOSIS — R200 Anesthesia of skin: Secondary | ICD-10-CM

## 2023-04-17 DIAGNOSIS — G8929 Other chronic pain: Secondary | ICD-10-CM | POA: Diagnosis not present

## 2023-04-17 DIAGNOSIS — M25512 Pain in left shoulder: Secondary | ICD-10-CM | POA: Diagnosis not present

## 2023-04-17 NOTE — Progress Notes (Signed)
 The patient is a 78 year old female well-known to [place].  She is in physical therapy currently due to left shoulder impingement.  She says therapy has been helpful and we have provided an injection in her left shoulder subacromial outlet in the past.  We were also seeing her in follow-up after having a MRI of her lumbar spine.  She has persistent numbness in the dorsum of her right foot with no weakness.  There is really no pain either.  She had a MRI back on March 7 but has not been read by radiology yet.  I did go over the MRI with her and I do see potential for nerve compression of the lower lumbar spine to the right sided L4-L5 into the left-sided L5-S1.  Both of these can be causing her symptoms but right now she said this is something that she can deal with.  My exam she has 5 out of 5 strength of the left lower extremity and right lower extremity.  There is no weakness with dorsiflexion or plantarflexion of her right foot and no weakness in flexion extension of her right side lower extremity.  Her shoulder is moving better overall.  She still having a little bit of problems with reaching behind her.  She does have a bone density exam this been done recently showing osteopenia and her nurse practitioner is working on calcium and vitamin D management.  Right now I would not recommend any type of intervention for the numbness and tingling in her right foot since she is otherwise asymptomatic and she said she would rather not have anything done right now because she can live with this for now.  If things worsen though she will let us know.  If I see anything worrisome on the MRI report once it is read we will call and let her know.

## 2023-04-18 ENCOUNTER — Ambulatory Visit

## 2023-04-18 ENCOUNTER — Telehealth: Payer: Self-pay | Admitting: Orthopaedic Surgery

## 2023-04-18 ENCOUNTER — Other Ambulatory Visit: Payer: Self-pay

## 2023-04-18 DIAGNOSIS — G8929 Other chronic pain: Secondary | ICD-10-CM

## 2023-04-18 DIAGNOSIS — R293 Abnormal posture: Secondary | ICD-10-CM

## 2023-04-18 DIAGNOSIS — M25512 Pain in left shoulder: Secondary | ICD-10-CM | POA: Diagnosis not present

## 2023-04-18 DIAGNOSIS — R928 Other abnormal and inconclusive findings on diagnostic imaging of breast: Secondary | ICD-10-CM | POA: Diagnosis not present

## 2023-04-18 DIAGNOSIS — M25612 Stiffness of left shoulder, not elsewhere classified: Secondary | ICD-10-CM | POA: Diagnosis not present

## 2023-04-18 NOTE — Telephone Encounter (Signed)
 Patient called and ask if he can write a prescription for PT in [facility]?CB#[phone]

## 2023-04-18 NOTE — Telephone Encounter (Signed)
 Referral placed.

## 2023-04-18 NOTE — Therapy (Signed)
 OUTPATIENT PHYSICAL THERAPY TREATMENT   Patient Name: Natalie Sosa MRN: [id number] DOB:07/14/1945, 78 y.o., female Today's Date: 04/18/2023  END OF SESSION:  PT End of Session - 04/18/23 0809     Visit Number 5    Number of Visits 17    Date for PT Re-Evaluation 05/26/23    PT Start Time 0809    PT Stop Time 0853    PT Time Calculation (min) 44 min    Activity Tolerance Patient tolerated treatment well    Behavior During Therapy [facility] for tasks assessed/performed                 Past Medical History:  Diagnosis Date   Arthritis    right knee, right thumb   Bright red rectal bleeding 09/01/2021   Cancer (HCC)    skin   COVID-19 12/31/2020   ENDOMETRIOSIS 10/04/2006   Qualifier: History of  By: Hetty Ely MD, [name]    Suprapubic pressure 09/01/2021   Past Surgical History:  Procedure Laterality Date   ACHILLES TENDON REPAIR  06/14/2004   CATARACT EXTRACTION W/PHACO Right 11/09/2022   Procedure: CATARACT EXTRACTION PHACO AND INTRAOCULAR LENS PLACEMENT (IOC) RIGHT TORIC 7.96 00:48.4;  Surgeon: Lockie Mola, MD;  Location: [facility];  Service: Ophthalmology;  Laterality: Right;   CATARACT EXTRACTION W/PHACO Left 12/07/2022   Procedure: CATARACT EXTRACTION PHACO AND INTRAOCULAR LENS PLACEMENT (IOC) LEFT  CLAREON VIVITY TORIC LENS 7.43 00:49.5;  Surgeon: Lockie Mola, MD;  Location: [facility];  Service: Ophthalmology;  Laterality: Left;   CESAREAN SECTION     placenta previa   CHOLECYSTECTOMY  06/13/2001   COLONOSCOPY WITH PROPOFOL N/A 09/24/2021   Procedure: COLONOSCOPY WITH PROPOFOL;  Surgeon: Toney Reil, MD;  Location: [facility];  Service: Gastroenterology;  Laterality: N/A;   Patient Active Problem List   Diagnosis Date Noted   Adjustment reaction with anxiety and depression 03/01/2023   Osteoarthritis 02/17/2022   Encounter for annual general medical examination with abnormal findings in adult 02/17/2022    Arthritis of carpometacarpal [facility]) joint of right thumb 12/28/2021   Polyp of cecum    Unilateral primary osteoarthritis, right knee 09/22/2021   Screen for colon cancer 01/11/2016   Insomnia 01/01/2014   HYPERCHOLESTEROLEMIA 10/04/2006    PCP: Doreene Nest, NP   REFERRING PROVIDER: Kathryne Hitch, MD  REFERRING DIAG: [phone] (ICD-10-CM) - Coracoid impingement of left shoulder    THERAPY DIAG:  Left shoulder pain, unspecified chronicity  Abnormal posture  Stiffness of left shoulder, not elsewhere classified  Rationale for Evaluation and Treatment: Rehabilitation  ONSET DATE: June or July 2024  SUBJECTIVE:  SUBJECTIVE STATEMENT:   L shoulder feels better. No pain currently. Has low back pain. Asked her doctor for a referral for PT for her back pain. Also has neck pain       Hand dominance: Right  PERTINENT HISTORY:  L shoulder pain. Pain is mainly in the front of her arm. Pain began suddenly almost a year ago back in June or July 2024. Unknown mechanism of injury. Pain has stayed about the same since onset. Pt knits a lot, which is the main thing she knows that she does with her L hand. Plays tennis but only uses her L hand to toss the ball up for a serve. Also getting an MRI for her low back this Friday for another issue (R dorsal foot numbness).    No latex allergies. No blood pressure problems per pt.  Has osteopenia/osteoporosis   PAIN:  Are you having pain? Yes: NPRS scale: 0/10 Pain location: L anterior shoulder and arm Pain description: sharp ache.  Aggravating factors: reaching back, putting her hand into a coat sleeve, fixing her hair, unable to don a bra secondary to L shoulder and anterior arm pain Relieving factors: resting position, rubbing  that area  PRECAUTIONS: Osteopenia/Osteoporosis  RED FLAGS: Bowel or bladder incontinence: No and Cauda equina syndrome: No     WEIGHT BEARING RESTRICTIONS: No  FALLS:  Has patient fallen in last 6 months? No  LIVING ENVIRONMENT: Lives with: lives with their spouse Lives in: House/apartment Stairs: No Has following equipment at home: None  OCCUPATION: retired  PLOF: Independent  PATIENT GOALS: Be able to don and doff clothes such as a bra and to be able to sleep on her L side/shoulder.   NEXT MD VISIT: 04/17/2023  OBJECTIVE:  Note: Objective measures were completed at Evaluation unless otherwise noted.  DIAGNOSTIC FINDINGS:  XR Shoulder Left  03/20/2023 Result Narrative  3 views of the left shoulder show no acute findings.  The shoulder is well located.  There is no significant glenohumeral arthritis.  The humeral head is not high riding in the subacromial outlet is well-maintained.      PATIENT SURVEYS:  UEFI score 63/80 (03/27/2023)  COGNITION: Overall cognitive status: Within functional limits for tasks assessed  SENSATION: WFL  POSTURE: forward neck, movement preference around C5/C6 area, and C3/C4 area, B protracted shoulders, R shoulder higher, thoracic kyphosis, R lateral shift   PALPATION: No TTP L shoulder, B upper trap and cervical paraspinal muscle tension.    CERVICAL ROM:   Active ROM A/PROM (deg) eval  Flexion full  Extension WFL  Right lateral flexion WFL  Left lateral flexion WFL  Right rotation WFL  Left rotation WFL   (Blank rows = not tested)  UPPER EXTREMITY ROM:  Active ROM Right eval Left eval  Shoulder flexion 136 118 (120 AAROM, stiff end range), with reproduction of symptoms.  Shoulder extension    Shoulder abduction 155 120 (132 AAROM, stiff end feel) with reproduction of symptoms.   Shoulder adduction    Shoulder extension    Shoulder internal rotation (functional) full L thumb to L3 spinous process. With pain   Shoulder external rotation (functional) R 3rd digit to T1 transverse process L 3rd digit to T1 transverse process  Elbow flexion    Elbow extension    Wrist flexion    Wrist extension    Wrist ulnar deviation    Wrist radial deviation    Wrist pronation    Wrist supination     (Blank  rows = not tested)  UPPER EXTREMITY MMT:  MMT Right eval Left eval  Shoulder flexion 4+ 4+  Shoulder extension    Shoulder abduction 4+ 4          Shoulder internal rotation 4+ 4  Shoulder external rotation 4 4  Middle trapezius  3  Lower trapezius   4-  Elbow flexion  4  Elbow extension  4  Wrist flexion    Wrist extension    Wrist ulnar deviation    Wrist radial deviation    Wrist pronation    Wrist supination    Grip strength     (Blank rows = not tested)  SPECIAL TESTS:  (+) Hawkins-Kennedy test (-) empty can test  (+) Neer's impingement.   FUNCTIONAL TESTS:    TREATMENT DATE:  04/18/23  Therapeutic exercise  Seated L shoulder inferior glide self mob 10x5 seconds for 3 sets  Standing B shoulder ER yellow band 10x5 seconds for 3 sets  Standing B scapular retraction green band 10x5 seconds for 3 sets  Supine L triceps extension with L shoulder in about 90 degrees flexion 10x2 green band  Supine PNF D2 flexion L shoulder yellow band 10x3  Supine B shoulder horizontal abduction 10x5 seconds to promote thoracic extension for 2 sets  Wall push-ups 8x   Pulley L UE AAROM   Flexion 10x5 seconds    Scaption 10x5 seconds      Improved exercise technique, movement at target joints, use of target muscles after mod verbal, visual, tactile cues.    PATIENT EDUCATION:  Education details: POC Person educated: Patient Education method: Explanation Education comprehension: verbalized understanding  HOME EXERCISE PROGRAM: Access Code: [email or web address] URL: [email or web address] Date: 04/07/2023 Prepared by:   Exercises - Standing Isometric Shoulder Flexion  with Doorway - Arm Bent  - 2-3 x daily - 5-7 x weekly - 3 sets - 10 reps - 3-5 sec hold - Standing Isometric Shoulder Abduction with Doorway - Arm Bent  - 2-3 x daily - 5-7 x weekly - 3 sets - 10 reps - 3-5 sec  hold - Standing Isometric Shoulder Internal Rotation at Doorway  - 2-3 x daily - 5-7 x weekly - 3 sets - 10 reps - 3-5 sec hold - Standing Isometric Shoulder External Rotation with Doorway  - 2-3 x daily - 5-7 x weekly - 3 sets - 10 reps - 3-5 sec hold - Supine Shoulder Flexion Extension AAROM with Dowel  - 2-3 x daily - 5-7 x weekly - 3 sets - 10 reps - Supine Shoulder Abduction AAROM with Dowel  - 2-3 x daily - 5-7 x weekly - 3 sets - 10 reps - Single Arm Doorway Pec Stretch at 90 Degrees Abduction  - 2-3 x daily - 5-7 x weekly - 3-5 reps - 30 seconds hold   - Isometric Tricep Extension   - 1 x daily - 7 x weekly - 3 sets - 10 reps - 5 seconds hold  - Seated Shoulder Inferior Glide  - 1 x daily - 7 x weekly - 3 sets - 10 reps - 5 seconds hold - Standing Shoulder External Rotation with Resistance  - 1 x daily - 7 x weekly - 3 sets - 10 reps - 5 seconds hold  Yellow band  ASSESSMENT:  CLINICAL IMPRESSION:  Continued working on improving inferior and posterior L humeral head movement to decrease stress to anterior shoulder. Also worked on thoracic extension and scapular strengthening to promote scapular retraction and  posterior tipping to decrease anterior shoulder stress. Pt tolerated session well without aggravation of symptoms. Pt will benefit from continued skilled physical therapy services to decrease pain, stiffness, improve ROM, strength, and function.     OBJECTIVE IMPAIRMENTS: decreased ROM, decreased strength, improper body mechanics, postural dysfunction, and pain.   ACTIVITY LIMITATIONS: lifting, sleeping, bathing, dressing, reach over head, and hygiene/grooming  PARTICIPATION LIMITATIONS:   PERSONAL FACTORS: Age, Time since onset of injury/illness/exacerbation, and 1  comorbidity: arthritis  are also affecting patient's functional outcome.   REHAB POTENTIAL: Fair    CLINICAL DECISION MAKING: Stable/uncomplicated  EVALUATION COMPLEXITY: Low   GOALS: Goals reviewed with patient? Yes  SHORT TERM GOALS: Target date: 04/07/2023  Pt will be independent with her initial HEP to improve L shoulder AROM, strength, function, and ability to reach more comfortably.  Baseline: Pt has not yet started her initial HEP (03/27/2023) Goal status: INITIAL    LONG TERM GOALS: Target date: 05/26/2023  Pt will have a decrease in L shoulder pain to 3/10 or less at worst to promote ability to reach, further and more comfortably.  Baseline: 7/10 L anterior shoulder and arm pain at worst for the past 3 months (03/27/2023) Goal status: INITIAL  2.  Pt will improve her L shoulder flexion, and abduction AROM to at least 135 degrees and functional shoulder IR to L thumb to T7 spinous process to promote ability to don and doff clothing as well as promote ability to reach more comfortably.  Baseline:  Active ROM Right eval Left eval  Shoulder flexion 136 118 (120 AAROM, stiff end range), with reproduction of symptoms.  Shoulder extension    Shoulder abduction 155 120 (132 AAROM, stiff end feel) with reproduction of symptoms.           Shoulder internal rotation (functional) full L thumb to L3 spinous process. With pain  Shoulder external rotation (functional) R 3rd digit to T1 transverse process L 3rd digit to T1 transverse process  (03/27/2023)  Goal status: INITIAL  3.  Pt will improve her Upper Extremity Functional Index (UEFI) score by at least 8 points as a demonstration of improved function.  Baseline: UEFI score 63/80 (03/27/2023) Goal status: INITIAL  4.  Pt will improve her L shoulder ER, IR, middle and lower trap strength by at least 1/2 MMT to promote ability to raise her arm and reach more comfortably.  Baseline:  MMT Right eval Left eval  Shoulder internal  rotation 4+ 4  Shoulder external rotation 4 4  Middle trapezius  3  Lower trapezius   4-  (03/27/2023)  Goal status: INITIAL     PLAN:  PT FREQUENCY: 2x/week  PT DURATION: 8 weeks  PLANNED INTERVENTIONS: 97110-Therapeutic exercises, 97530- Therapeutic activity, 97112- Neuromuscular re-education, 97535- Self Care, 29562- Manual therapy, G0283- Electrical stimulation (unattended), [phone]- Ionotophoresis 4mg /ml Dexamethasone, Patient/Family education, Dry Needling, and Joint mobilization  PLAN FOR NEXT SESSION: posture, thoracic extension, scapular, ER, IR strengthening, manual techniques, modalities PRN   Buell Parcel, PT, DPT 04/18/2023, 9:01 AM

## 2023-04-20 ENCOUNTER — Ambulatory Visit
Admission: RE | Admit: 2023-04-20 | Discharge: 2023-04-20 | Disposition: A | Discharge status: Home / Self Care | Source: Ambulatory Visit | Attending: Primary Care | Admitting: Primary Care

## 2023-04-20 ENCOUNTER — Ambulatory Visit

## 2023-04-20 DIAGNOSIS — R92321 Mammographic fibroglandular density, right breast: Secondary | ICD-10-CM | POA: Diagnosis not present

## 2023-04-20 DIAGNOSIS — N6001 Solitary cyst of right breast: Secondary | ICD-10-CM | POA: Diagnosis not present

## 2023-04-20 DIAGNOSIS — R928 Other abnormal and inconclusive findings on diagnostic imaging of breast: Secondary | ICD-10-CM | POA: Insufficient documentation

## 2023-04-20 DIAGNOSIS — R293 Abnormal posture: Secondary | ICD-10-CM | POA: Diagnosis not present

## 2023-04-20 DIAGNOSIS — M25612 Stiffness of left shoulder, not elsewhere classified: Secondary | ICD-10-CM | POA: Diagnosis not present

## 2023-04-20 DIAGNOSIS — N6312 Unspecified lump in the right breast, upper inner quadrant: Secondary | ICD-10-CM | POA: Diagnosis not present

## 2023-04-20 DIAGNOSIS — M25512 Pain in left shoulder: Secondary | ICD-10-CM | POA: Diagnosis not present

## 2023-04-20 NOTE — Therapy (Signed)
 OUTPATIENT PHYSICAL THERAPY TREATMENT   Patient Name: Natalie Sosa MRN: [id number] DOB:Jun 24, 1945, 78 y.o., female Today's Date: 04/20/2023  END OF SESSION:  PT End of Session - 04/20/23 0948     Visit Number 6    Number of Visits 17    Date for PT Re-Evaluation 05/26/23    PT Start Time 0948    PT Stop Time 1029    PT Time Calculation (min) 41 min    Activity Tolerance Patient tolerated treatment well    Behavior During Therapy [facility] for tasks assessed/performed                  Past Medical History:  Diagnosis Date   Arthritis    right knee, right thumb   Bright red rectal bleeding 09/01/2021   Cancer (HCC)    skin   COVID-19 12/31/2020   ENDOMETRIOSIS 10/04/2006   Qualifier: History of  By: Hetty Ely MD, [name]    Suprapubic pressure 09/01/2021   Past Surgical History:  Procedure Laterality Date   ACHILLES TENDON REPAIR  06/14/2004   CATARACT EXTRACTION W/PHACO Right 11/09/2022   Procedure: CATARACT EXTRACTION PHACO AND INTRAOCULAR LENS PLACEMENT (IOC) RIGHT TORIC 7.96 00:48.4;  Surgeon: Lockie Mola, MD;  Location: [facility];  Service: Ophthalmology;  Laterality: Right;   CATARACT EXTRACTION W/PHACO Left 12/07/2022   Procedure: CATARACT EXTRACTION PHACO AND INTRAOCULAR LENS PLACEMENT (IOC) LEFT  CLAREON VIVITY TORIC LENS 7.43 00:49.5;  Surgeon: Lockie Mola, MD;  Location: [facility];  Service: Ophthalmology;  Laterality: Left;   CESAREAN SECTION     placenta previa   CHOLECYSTECTOMY  06/13/2001   COLONOSCOPY WITH PROPOFOL N/A 09/24/2021   Procedure: COLONOSCOPY WITH PROPOFOL;  Surgeon: Toney Reil, MD;  Location: [facility];  Service: Gastroenterology;  Laterality: N/A;   Patient Active Problem List   Diagnosis Date Noted   Adjustment reaction with anxiety and depression 03/01/2023   Osteoarthritis 02/17/2022   Encounter for annual general medical examination with abnormal findings in adult 02/17/2022    Arthritis of carpometacarpal [facility]) joint of right thumb 12/28/2021   Polyp of cecum    Unilateral primary osteoarthritis, right knee 09/22/2021   Screen for colon cancer 01/11/2016   Insomnia 01/01/2014   HYPERCHOLESTEROLEMIA 10/04/2006    PCP: Doreene Nest, NP   REFERRING PROVIDER: Kathryne Hitch, MD  REFERRING DIAG: [phone] (ICD-10-CM) - Coracoid impingement of left shoulder    THERAPY DIAG:  Left shoulder pain, unspecified chronicity  Abnormal posture  Stiffness of left shoulder, not elsewhere classified  Rationale for Evaluation and Treatment: Rehabilitation  ONSET DATE: June or July 2024  SUBJECTIVE:  SUBJECTIVE STATEMENT:   L shoulder feels pretty good. No pain currently. Tried the wall push-ups at home, can do 10x. Hard to do. 4/10 L shoulder pain at worst for the past 7 days but that is not reaching behind her.      Hand dominance: Right  PERTINENT HISTORY:  L shoulder pain. Pain is mainly in the front of her arm. Pain began suddenly almost a year ago back in June or July 2024. Unknown mechanism of injury. Pain has stayed about the same since onset. Pt knits a lot, which is the main thing she knows that she does with her L hand. Plays tennis but only uses her L hand to toss the ball up for a serve. Also getting an MRI for her low back this Friday for another issue (R dorsal foot numbness).    No latex allergies. No blood pressure problems per pt.  Has osteopenia/osteoporosis   PAIN:  Are you having pain? Yes: NPRS scale: 0/10 Pain location: L anterior shoulder and arm Pain description: sharp ache.  Aggravating factors: reaching back, putting her hand into a coat sleeve, fixing her hair, unable to don a bra secondary to L shoulder and anterior arm  pain Relieving factors: resting position, rubbing that area  PRECAUTIONS: Osteopenia/Osteoporosis  RED FLAGS: Bowel or bladder incontinence: No and Cauda equina syndrome: No     WEIGHT BEARING RESTRICTIONS: No  FALLS:  Has patient fallen in last 6 months? No  LIVING ENVIRONMENT: Lives with: lives with their spouse Lives in: House/apartment Stairs: No Has following equipment at home: None  OCCUPATION: retired  PLOF: Independent  PATIENT GOALS: Be able to don and doff clothes such as a bra and to be able to sleep on her L side/shoulder.   NEXT MD VISIT: 04/17/2023  OBJECTIVE:  Note: Objective measures were completed at Evaluation unless otherwise noted.  DIAGNOSTIC FINDINGS:  XR Shoulder Left  03/20/2023 Result Narrative  3 views of the left shoulder show no acute findings.  The shoulder is well located.  There is no significant glenohumeral arthritis.  The humeral head is not high riding in the subacromial outlet is well-maintained.      PATIENT SURVEYS:  UEFI score 63/80 (03/27/2023)  COGNITION: Overall cognitive status: Within functional limits for tasks assessed  SENSATION: WFL  POSTURE: forward neck, movement preference around C5/C6 area, and C3/C4 area, B protracted shoulders, R shoulder higher, thoracic kyphosis, R lateral shift   PALPATION: No TTP L shoulder, B upper trap and cervical paraspinal muscle tension.    CERVICAL ROM:   Active ROM A/PROM (deg) eval  Flexion full  Extension WFL  Right lateral flexion WFL  Left lateral flexion WFL  Right rotation WFL  Left rotation WFL   (Blank rows = not tested)  UPPER EXTREMITY ROM:  Active ROM Right eval Left eval  Shoulder flexion 136 118 (120 AAROM, stiff end range), with reproduction of symptoms.  Shoulder extension    Shoulder abduction 155 120 (132 AAROM, stiff end feel) with reproduction of symptoms.   Shoulder adduction    Shoulder extension    Shoulder internal rotation (functional)  full L thumb to L3 spinous process. With pain  Shoulder external rotation (functional) R 3rd digit to T1 transverse process L 3rd digit to T1 transverse process  Elbow flexion    Elbow extension    Wrist flexion    Wrist extension    Wrist ulnar deviation    Wrist radial deviation    Wrist pronation  Wrist supination     (Blank rows = not tested)  UPPER EXTREMITY MMT:  MMT Right eval Left eval  Shoulder flexion 4+ 4+  Shoulder extension    Shoulder abduction 4+ 4          Shoulder internal rotation 4+ 4  Shoulder external rotation 4 4  Middle trapezius  3  Lower trapezius   4-  Elbow flexion  4  Elbow extension  4  Wrist flexion    Wrist extension    Wrist ulnar deviation    Wrist radial deviation    Wrist pronation    Wrist supination    Grip strength     (Blank rows = not tested)  SPECIAL TESTS:  (+) Hawkins-Kennedy test (-) empty can test  (+) Neer's impingement.   FUNCTIONAL TESTS:    TREATMENT DATE:  04/20/23  Therapeutic exercise  Standing L shoulder functional IR  L thumb to bra strap  Pulley L UE AAROM   Flexion 10x5 seconds for 2 sets   Scaption 10x5 seconds for 2 sets  Standing L shoulder IR green band 10x5 seconds for 3 sets  Standing B shoulder ER red band 5x for 3 sets  challenging  Seated thoracic extension at chair 10x5 seconds for 2 sets  Standing B scapular retraction green band 10x5 seconds for 2 sets  Supine L triceps extension with L shoulder in about 90 degrees flexion 10x3 green band  Supine PNF D2 flexion L shoulder yellow band 10x3  Supine B shoulder horizontal abduction 10x5 seconds to promote thoracic extension          Improved exercise technique, movement at target joints, use of target muscles after mod verbal, visual, tactile cues.    PATIENT EDUCATION:  Education details: POC Person educated: Patient Education method: Explanation Education comprehension: verbalized understanding  HOME EXERCISE  PROGRAM: Access Code: [email or web address] URL: [email or web address] Date: 04/07/2023 Prepared by:   Exercises - Standing Isometric Shoulder Flexion with Doorway - Arm Bent  - 2-3 x daily - 5-7 x weekly - 3 sets - 10 reps - 3-5 sec hold - Standing Isometric Shoulder Abduction with Doorway - Arm Bent  - 2-3 x daily - 5-7 x weekly - 3 sets - 10 reps - 3-5 sec  hold - Standing Isometric Shoulder Internal Rotation at Doorway  - 2-3 x daily - 5-7 x weekly - 3 sets - 10 reps - 3-5 sec hold - Standing Isometric Shoulder External Rotation with Doorway  - 2-3 x daily - 5-7 x weekly - 3 sets - 10 reps - 3-5 sec hold - Supine Shoulder Flexion Extension AAROM with Dowel  - 2-3 x daily - 5-7 x weekly - 3 sets - 10 reps - Supine Shoulder Abduction AAROM with Dowel  - 2-3 x daily - 5-7 x weekly - 3 sets - 10 reps - Single Arm Doorway Pec Stretch at 90 Degrees Abduction  - 2-3 x daily - 5-7 x weekly - 3-5 reps - 30 seconds hold   - Isometric Tricep Extension   - 1 x daily - 7 x weekly - 3 sets - 10 reps - 5 seconds hold  - Seated Shoulder Inferior Glide  - 1 x daily - 7 x weekly - 3 sets - 10 reps - 5 seconds hold - Standing Shoulder External Rotation with Resistance  - 1 x daily - 7 x weekly - 3 sets - 10 reps - 5 seconds hold  Yellow band  ASSESSMENT:  CLINICAL IMPRESSION:  Improved L shoulder functional IR, with her thumb being able to reach her bra strap. Continued working on improving inferior and posterior L humeral head movement to decrease stress to anterior shoulder. Also continued working on thoracic extension and scapular strengthening to promote scapular retraction and posterior tipping to decrease anterior shoulder stress. Pt tolerated session well without aggravation of symptoms. Pt will benefit from continued skilled physical therapy services to decrease pain, stiffness, improve ROM, strength, and function.     OBJECTIVE IMPAIRMENTS: decreased ROM, decreased strength, improper body  mechanics, postural dysfunction, and pain.   ACTIVITY LIMITATIONS: lifting, sleeping, bathing, dressing, reach over head, and hygiene/grooming  PARTICIPATION LIMITATIONS:   PERSONAL FACTORS: Age, Time since onset of injury/illness/exacerbation, and 1 comorbidity: arthritis  are also affecting patient's functional outcome.   REHAB POTENTIAL: Fair    CLINICAL DECISION MAKING: Stable/uncomplicated  EVALUATION COMPLEXITY: Low   GOALS: Goals reviewed with patient? Yes  SHORT TERM GOALS: Target date: 04/07/2023  Pt will be independent with her initial HEP to improve L shoulder AROM, strength, function, and ability to reach more comfortably.  Baseline: Pt has not yet started her initial HEP (03/27/2023) Goal status: INITIAL    LONG TERM GOALS: Target date: 05/26/2023  Pt will have a decrease in L shoulder pain to 3/10 or less at worst to promote ability to reach, further and more comfortably.  Baseline: 7/10 L anterior shoulder and arm pain at worst for the past 3 months (03/27/2023) Goal status: INITIAL  2.  Pt will improve her L shoulder flexion, and abduction AROM to at least 135 degrees and functional shoulder IR to L thumb to T7 spinous process to promote ability to don and doff clothing as well as promote ability to reach more comfortably.  Baseline:  Active ROM Right eval Left eval  Shoulder flexion 136 118 (120 AAROM, stiff end range), with reproduction of symptoms.  Shoulder extension    Shoulder abduction 155 120 (132 AAROM, stiff end feel) with reproduction of symptoms.           Shoulder internal rotation (functional) full L thumb to L3 spinous process. With pain  Shoulder external rotation (functional) R 3rd digit to T1 transverse process L 3rd digit to T1 transverse process  (03/27/2023)  Goal status: INITIAL  3.  Pt will improve her Upper Extremity Functional Index (UEFI) score by at least 8 points as a demonstration of improved function.  Baseline: UEFI score 63/80  (03/27/2023) Goal status: INITIAL  4.  Pt will improve her L shoulder ER, IR, middle and lower trap strength by at least 1/2 MMT to promote ability to raise her arm and reach more comfortably.  Baseline:  MMT Right eval Left eval  Shoulder internal rotation 4+ 4  Shoulder external rotation 4 4  Middle trapezius  3  Lower trapezius   4-  (03/27/2023)  Goal status: INITIAL     PLAN:  PT FREQUENCY: 2x/week  PT DURATION: 8 weeks  PLANNED INTERVENTIONS: 97110-Therapeutic exercises, 97530- Therapeutic activity, 97112- Neuromuscular re-education, 97535- Self Care, [id number]- Manual therapy, [id number]- Electrical stimulation (unattended), [phone]- Ionotophoresis 4mg /ml Dexamethasone, Patient/Family education, Dry Needling, and Joint mobilization  PLAN FOR NEXT SESSION: posture, thoracic extension, scapular, ER, IR strengthening, manual techniques, modalities PRN   Airen Stiehl, PT, DPT 04/20/2023, 1:06 PM

## 2023-04-25 ENCOUNTER — Ambulatory Visit: Attending: Orthopaedic Surgery

## 2023-04-25 DIAGNOSIS — R293 Abnormal posture: Secondary | ICD-10-CM | POA: Insufficient documentation

## 2023-04-25 DIAGNOSIS — M25612 Stiffness of left shoulder, not elsewhere classified: Secondary | ICD-10-CM | POA: Diagnosis not present

## 2023-04-25 DIAGNOSIS — M5459 Other low back pain: Secondary | ICD-10-CM | POA: Insufficient documentation

## 2023-04-25 DIAGNOSIS — M25512 Pain in left shoulder: Secondary | ICD-10-CM | POA: Insufficient documentation

## 2023-04-25 NOTE — Therapy (Signed)
 OUTPATIENT PHYSICAL THERAPY TREATMENT   Patient Name: Natalie Sosa MRN: [id number] DOB:September 16, 1945, 78 y.o., female Today's Date: 04/25/2023  END OF SESSION:  PT End of Session - 04/25/23 1302     Visit Number 7    Number of Visits 17    Date for PT Re-Evaluation 05/26/23    PT Start Time 1302    PT Stop Time 1342    PT Time Calculation (min) 40 min    Activity Tolerance Patient tolerated treatment well    Behavior During Therapy [facility] for tasks assessed/performed                   Past Medical History:  Diagnosis Date   Arthritis    right knee, right thumb   Bright red rectal bleeding 09/01/2021   Cancer (HCC)    skin   COVID-19 12/31/2020   ENDOMETRIOSIS 10/04/2006   Qualifier: History of  By: Hetty Ely MD, [name]    Suprapubic pressure 09/01/2021   Past Surgical History:  Procedure Laterality Date   ACHILLES TENDON REPAIR  06/14/2004   CATARACT EXTRACTION W/PHACO Right 11/09/2022   Procedure: CATARACT EXTRACTION PHACO AND INTRAOCULAR LENS PLACEMENT (IOC) RIGHT TORIC 7.96 00:48.4;  Surgeon: Lockie Mola, MD;  Location: [facility];  Service: Ophthalmology;  Laterality: Right;   CATARACT EXTRACTION W/PHACO Left 12/07/2022   Procedure: CATARACT EXTRACTION PHACO AND INTRAOCULAR LENS PLACEMENT (IOC) LEFT  CLAREON VIVITY TORIC LENS 7.43 00:49.5;  Surgeon: Lockie Mola, MD;  Location: [facility];  Service: Ophthalmology;  Laterality: Left;   CESAREAN SECTION     placenta previa   CHOLECYSTECTOMY  06/13/2001   COLONOSCOPY WITH PROPOFOL N/A 09/24/2021   Procedure: COLONOSCOPY WITH PROPOFOL;  Surgeon: Toney Reil, MD;  Location: [facility];  Service: Gastroenterology;  Laterality: N/A;   Patient Active Problem List   Diagnosis Date Noted   Adjustment reaction with anxiety and depression 03/01/2023   Osteoarthritis 02/17/2022   Encounter for annual general medical examination with abnormal findings in adult 02/17/2022    Arthritis of carpometacarpal [facility]) joint of right thumb 12/28/2021   Polyp of cecum    Unilateral primary osteoarthritis, right knee 09/22/2021   Screen for colon cancer 01/11/2016   Insomnia 01/01/2014   HYPERCHOLESTEROLEMIA 10/04/2006    PCP: Doreene Nest, NP   REFERRING PROVIDER: Kathryne Hitch, MD  REFERRING DIAG: [phone] (ICD-10-CM) - Coracoid impingement of left shoulder    THERAPY DIAG:  Left shoulder pain, unspecified chronicity  Abnormal posture  Stiffness of left shoulder, not elsewhere classified  Rationale for Evaluation and Treatment: Rehabilitation  ONSET DATE: June or July 2024  SUBJECTIVE:  SUBJECTIVE STATEMENT:   Got a prescription for her back for PT.  L shoulder is ok, no pain currently. Felt like a sore muscle L lateral arm earlier. 3/10 L lateral arm when raising it up to the side.       Hand dominance: Right  PERTINENT HISTORY:  L shoulder pain. Pain is mainly in the front of her arm. Pain began suddenly almost a year ago back in June or July 2024. Unknown mechanism of injury. Pain has stayed about the same since onset. Pt knits a lot, which is the main thing she knows that she does with her L hand. Plays tennis but only uses her L hand to toss the ball up for a serve. Also getting an MRI for her low back this Friday for another issue (R dorsal foot numbness).    No latex allergies. No blood pressure problems per pt.  Has osteopenia/osteoporosis   PAIN:  Are you having pain? Yes: NPRS scale: 0/10 Pain location: L anterior shoulder and arm Pain description: sharp ache.  Aggravating factors: reaching back, putting her hand into a coat sleeve, fixing her hair, unable to don a bra secondary to L shoulder and anterior arm pain Relieving  factors: resting position, rubbing that area  PRECAUTIONS: Osteopenia/Osteoporosis  RED FLAGS: Bowel or bladder incontinence: No and Cauda equina syndrome: No     WEIGHT BEARING RESTRICTIONS: No  FALLS:  Has patient fallen in last 6 months? No  LIVING ENVIRONMENT: Lives with: lives with their spouse Lives in: House/apartment Stairs: No Has following equipment at home: None  OCCUPATION: retired  PLOF: Independent  PATIENT GOALS: Be able to don and doff clothes such as a bra and to be able to sleep on her L side/shoulder.   NEXT MD VISIT: 04/17/2023  OBJECTIVE:  Note: Objective measures were completed at Evaluation unless otherwise noted.  DIAGNOSTIC FINDINGS:  XR Shoulder Left  03/20/2023 Result Narrative  3 views of the left shoulder show no acute findings.  The shoulder is well located.  There is no significant glenohumeral arthritis.  The humeral head is not high riding in the subacromial outlet is well-maintained.      PATIENT SURVEYS:  UEFI score 63/80 (03/27/2023)  COGNITION: Overall cognitive status: Within functional limits for tasks assessed  SENSATION: WFL  POSTURE: forward neck, movement preference around C5/C6 area, and C3/C4 area, B protracted shoulders, R shoulder higher, thoracic kyphosis, R lateral shift   PALPATION: No TTP L shoulder, B upper trap and cervical paraspinal muscle tension.    CERVICAL ROM:   Active ROM A/PROM (deg) eval  Flexion full  Extension WFL  Right lateral flexion WFL  Left lateral flexion WFL  Right rotation WFL  Left rotation WFL   (Blank rows = not tested)  UPPER EXTREMITY ROM:  Active ROM Right eval Left eval  Shoulder flexion 136 118 (120 AAROM, stiff end range), with reproduction of symptoms.  Shoulder extension    Shoulder abduction 155 120 (132 AAROM, stiff end feel) with reproduction of symptoms.   Shoulder adduction    Shoulder extension    Shoulder internal rotation (functional) full L thumb to  L3 spinous process. With pain  Shoulder external rotation (functional) R 3rd digit to T1 transverse process L 3rd digit to T1 transverse process  Elbow flexion    Elbow extension    Wrist flexion    Wrist extension    Wrist ulnar deviation    Wrist radial deviation    Wrist pronation  Wrist supination     (Blank rows = not tested)  UPPER EXTREMITY MMT:  MMT Right eval Left eval  Shoulder flexion 4+ 4+  Shoulder extension    Shoulder abduction 4+ 4          Shoulder internal rotation 4+ 4  Shoulder external rotation 4 4  Middle trapezius  3  Lower trapezius   4-  Elbow flexion  4  Elbow extension  4  Wrist flexion    Wrist extension    Wrist ulnar deviation    Wrist radial deviation    Wrist pronation    Wrist supination    Grip strength     (Blank rows = not tested)  SPECIAL TESTS:  (+) Hawkins-Kennedy test (-) empty can test  (+) Neer's impingement.   FUNCTIONAL TESTS:    TREATMENT DATE:  04/25/2023  Therapeutic exercise  Seated L shoulder self inferior glide 10x10 seconds   Decreased L lateral arm symptoms with shoulder abduction  Standing face pulls (B shoulder abduction and ER) yellow band 10x3  Decreased L lateral arm symptoms with shoulder abduction   Seated thoracic extension at chair 10x5 seconds for 2 sets  Supine PNF D2 flexion L shoulder yellow band 10x3  Standing L doorway pectoralis stretch HEP review.    Supine L triceps extension with L shoulder in about 90 degrees flexion 10x3 green band  Standing L shoulder IR green band 10x5 seconds for 3 sets  Standing B shoulder ER red band 5x for 3 sets       Improved exercise technique, movement at target joints, use of target muscles after mod verbal, visual, tactile cues.    PATIENT EDUCATION:  Education details: POC Person educated: Patient Education method: Explanation Education comprehension: verbalized understanding  HOME EXERCISE PROGRAM: Access Code: [email or web address] URL:  [email or web address] Date: 04/07/2023 Prepared by:   Exercises - Standing Isometric Shoulder Flexion with Doorway - Arm Bent  - 2-3 x daily - 5-7 x weekly - 3 sets - 10 reps - 3-5 sec hold  Discontinued on 04/25/2023  - Standing Isometric Shoulder Abduction with Doorway - Arm Bent  - 2-3 x daily - 5-7 x weekly - 3 sets - 10 reps - 3-5 sec  hold  Discontinued on 04/25/2023  - Standing Isometric Shoulder Internal Rotation at Doorway  - 2-3 x daily - 5-7 x weekly - 3 sets - 10 reps - 3-5 sec hold  Discontinued on 04/25/2023  - Standing Isometric Shoulder External Rotation with Doorway  - 2-3 x daily - 5-7 x weekly - 3 sets - 10 reps - 3-5 sec hold  Discontinued on 04/25/2023   - Supine Shoulder Flexion Extension AAROM with Dowel  - 2-3 x daily - 5-7 x weekly - 3 sets - 10 reps - Supine Shoulder Abduction AAROM with Dowel  - 2-3 x daily - 5-7 x weekly - 3 sets - 10 reps - Single Arm Doorway Pec Stretch at 90 Degrees Abduction  - 2-3 x daily - 5-7 x weekly - 3-5 reps - 30 seconds hold   - Isometric Tricep Extension   - 1 x daily - 7 x weekly - 3 sets - 10 reps - 5 seconds hold  - Seated Shoulder Inferior Glide  - 1 x daily - 7 x weekly - 3 sets - 10 reps - 5 seconds hold - Standing Shoulder External Rotation with Resistance  - 1 x daily - 7 x weekly - 3 sets - 10 reps - 5  seconds hold  Yellow band  - Supine PNF D2 Flexion with Resistance  - 1 x daily - 7 x weekly - 3 sets - 10 reps  Yellow band   ASSESSMENT:  CLINICAL IMPRESSION: Continued working on improving L shoulder inferior joint mobility as well as posterior and inferior movement of humeral head through muscle activation. Improved glenohumeral joint mechanics palpated during exercises. L shoulder feels better after session reported. Pt will benefit from continued skilled physical therapy services to decrease pain, stiffness, improve ROM, strength, and function.     OBJECTIVE IMPAIRMENTS: decreased ROM, decreased  strength, improper body mechanics, postural dysfunction, and pain.   ACTIVITY LIMITATIONS: lifting, sleeping, bathing, dressing, reach over head, and hygiene/grooming  PARTICIPATION LIMITATIONS:   PERSONAL FACTORS: Age, Time since onset of injury/illness/exacerbation, and 1 comorbidity: arthritis  are also affecting patient's functional outcome.   REHAB POTENTIAL: Fair    CLINICAL DECISION MAKING: Stable/uncomplicated  EVALUATION COMPLEXITY: Low   GOALS: Goals reviewed with patient? Yes  SHORT TERM GOALS: Target date: 04/07/2023  Pt will be independent with her initial HEP to improve L shoulder AROM, strength, function, and ability to reach more comfortably.  Baseline: Pt has not yet started her initial HEP (03/27/2023) Goal status: INITIAL    LONG TERM GOALS: Target date: 05/26/2023  Pt will have a decrease in L shoulder pain to 3/10 or less at worst to promote ability to reach, further and more comfortably.  Baseline: 7/10 L anterior shoulder and arm pain at worst for the past 3 months (03/27/2023) Goal status: INITIAL  2.  Pt will improve her L shoulder flexion, and abduction AROM to at least 135 degrees and functional shoulder IR to L thumb to T7 spinous process to promote ability to don and doff clothing as well as promote ability to reach more comfortably.  Baseline:  Active ROM Right eval Left eval  Shoulder flexion 136 118 (120 AAROM, stiff end range), with reproduction of symptoms.  Shoulder extension    Shoulder abduction 155 120 (132 AAROM, stiff end feel) with reproduction of symptoms.           Shoulder internal rotation (functional) full L thumb to L3 spinous process. With pain  Shoulder external rotation (functional) R 3rd digit to T1 transverse process L 3rd digit to T1 transverse process  (03/27/2023)  Goal status: INITIAL  3.  Pt will improve her Upper Extremity Functional Index (UEFI) score by at least 8 points as a demonstration of improved function.   Baseline: UEFI score 63/80 (03/27/2023) Goal status: INITIAL  4.  Pt will improve her L shoulder ER, IR, middle and lower trap strength by at least 1/2 MMT to promote ability to raise her arm and reach more comfortably.  Baseline:  MMT Right eval Left eval  Shoulder internal rotation 4+ 4  Shoulder external rotation 4 4  Middle trapezius  3  Lower trapezius   4-  (03/27/2023)  Goal status: INITIAL     PLAN:  PT FREQUENCY: 2x/week  PT DURATION: 8 weeks  PLANNED INTERVENTIONS: 97110-Therapeutic exercises, 97530- Therapeutic activity, 97112- Neuromuscular re-education, 97535- Self Care, 16109- Manual therapy, G0283- Electrical stimulation (unattended), [id number]- Ionotophoresis 4mg /ml Dexamethasone, Patient/Family education, Dry Needling, and Joint mobilization  PLAN FOR NEXT SESSION: posture, thoracic extension, scapular, ER, IR strengthening, manual techniques, modalities PRN   Jatoya Armbrister, PT, DPT 04/25/2023, 4:31 PM

## 2023-04-28 ENCOUNTER — Ambulatory Visit

## 2023-04-28 ENCOUNTER — Telehealth: Payer: Self-pay | Admitting: Orthopaedic Surgery

## 2023-04-28 DIAGNOSIS — R293 Abnormal posture: Secondary | ICD-10-CM

## 2023-04-28 DIAGNOSIS — M25612 Stiffness of left shoulder, not elsewhere classified: Secondary | ICD-10-CM | POA: Diagnosis not present

## 2023-04-28 DIAGNOSIS — M5459 Other low back pain: Secondary | ICD-10-CM | POA: Diagnosis not present

## 2023-04-28 DIAGNOSIS — M25512 Pain in left shoulder: Secondary | ICD-10-CM

## 2023-04-28 NOTE — Telephone Encounter (Signed)
 Patient called and said that the referral that was suppose to get sent out was for [facility] and not [facility]. She needs it to go to [place]. [address] [phone]

## 2023-04-28 NOTE — Therapy (Signed)
 OUTPATIENT PHYSICAL THERAPY TREATMENT   Patient Name: FONDA ROCHON MRN: [id number] DOB:01/25/1945, 78 y.o., female Today's Date: 04/28/2023  END OF SESSION:  PT End of Session - 04/28/23 0732     Visit Number 8    Number of Visits 17    Date for PT Re-Evaluation 05/26/23    PT Start Time 0732    PT Stop Time 0816    PT Time Calculation (min) 44 min    Activity Tolerance Patient tolerated treatment well    Behavior During Therapy [facility] for tasks assessed/performed                    Past Medical History:  Diagnosis Date   Arthritis    right knee, right thumb   Bright red rectal bleeding 09/01/2021   Cancer (HCC)    skin   COVID-19 12/31/2020   ENDOMETRIOSIS 10/04/2006   Qualifier: History of  By: Hetty Ely MD, [name]    Suprapubic pressure 09/01/2021   Past Surgical History:  Procedure Laterality Date   ACHILLES TENDON REPAIR  06/14/2004   CATARACT EXTRACTION W/PHACO Right 11/09/2022   Procedure: CATARACT EXTRACTION PHACO AND INTRAOCULAR LENS PLACEMENT (IOC) RIGHT TORIC 7.96 00:48.4;  Surgeon: Lockie Mola, MD;  Location: [facility];  Service: Ophthalmology;  Laterality: Right;   CATARACT EXTRACTION W/PHACO Left 12/07/2022   Procedure: CATARACT EXTRACTION PHACO AND INTRAOCULAR LENS PLACEMENT (IOC) LEFT  CLAREON VIVITY TORIC LENS 7.43 00:49.5;  Surgeon: Lockie Mola, MD;  Location: [facility];  Service: Ophthalmology;  Laterality: Left;   CESAREAN SECTION     placenta previa   CHOLECYSTECTOMY  06/13/2001   COLONOSCOPY WITH PROPOFOL N/A 09/24/2021   Procedure: COLONOSCOPY WITH PROPOFOL;  Surgeon: Toney Reil, MD;  Location: [facility];  Service: Gastroenterology;  Laterality: N/A;   Patient Active Problem List   Diagnosis Date Noted   Adjustment reaction with anxiety and depression 03/01/2023   Osteoarthritis 02/17/2022   Encounter for annual general medical examination with abnormal findings in adult 02/17/2022    Arthritis of carpometacarpal [facility]) joint of right thumb 12/28/2021   Polyp of cecum    Unilateral primary osteoarthritis, right knee 09/22/2021   Screen for colon cancer 01/11/2016   Insomnia 01/01/2014   HYPERCHOLESTEROLEMIA 10/04/2006    PCP: Doreene Nest, NP   REFERRING PROVIDER: Kathryne Hitch, MD  REFERRING DIAG: [phone] (ICD-10-CM) - Coracoid impingement of left shoulder    THERAPY DIAG:  Left shoulder pain, unspecified chronicity  Abnormal posture  Stiffness of left shoulder, not elsewhere classified  Rationale for Evaluation and Treatment: Rehabilitation  ONSET DATE: June or July 2024  SUBJECTIVE:  SUBJECTIVE STATEMENT:   Neck bothers her when  she turns it to the R or to the L too far. Neck pain has been on and off for about a year. L shoulder pain has also been going on for a year. 2/10 L shoulder pain currently. 5/10 neck pain currently.     Hand dominance: Right  PERTINENT HISTORY:  L shoulder pain. Pain is mainly in the front of her arm. Pain began suddenly almost a year ago back in June or July 2024. Unknown mechanism of injury. Pain has stayed about the same since onset. Pt knits a lot, which is the main thing she knows that she does with her L hand. Plays tennis but only uses her L hand to toss the ball up for a serve. Also getting an MRI for her low back this Friday for another issue (R dorsal foot numbness).    No latex allergies. No blood pressure problems per pt.  Has osteopenia/osteoporosis   PAIN:  Are you having pain? Yes: NPRS scale: 0/10 Pain location: L anterior shoulder and arm Pain description: sharp ache.  Aggravating factors: reaching back, putting her hand into a coat sleeve, fixing her hair, unable to don a bra secondary to L  shoulder and anterior arm pain Relieving factors: resting position, rubbing that area  PRECAUTIONS: Osteopenia/Osteoporosis  RED FLAGS: Bowel or bladder incontinence: No and Cauda equina syndrome: No     WEIGHT BEARING RESTRICTIONS: No  FALLS:  Has patient fallen in last 6 months? No  LIVING ENVIRONMENT: Lives with: lives with their spouse Lives in: House/apartment Stairs: No Has following equipment at home: None  OCCUPATION: retired  PLOF: Independent  PATIENT GOALS: Be able to don and doff clothes such as a bra and to be able to sleep on her L side/shoulder.   NEXT MD VISIT: 04/17/2023  OBJECTIVE:  Note: Objective measures were completed at Evaluation unless otherwise noted.  DIAGNOSTIC FINDINGS:  XR Shoulder Left  03/20/2023 Result Narrative  3 views of the left shoulder show no acute findings.  The shoulder is well located.  There is no significant glenohumeral arthritis.  The humeral head is not high riding in the subacromial outlet is well-maintained.      PATIENT SURVEYS:  UEFI score 63/80 (03/27/2023)  COGNITION: Overall cognitive status: Within functional limits for tasks assessed  SENSATION: WFL  POSTURE: forward neck, movement preference around C5/C6 area, and C3/C4 area, B protracted shoulders, R shoulder higher, thoracic kyphosis, R lateral shift   PALPATION: No TTP L shoulder, B upper trap and cervical paraspinal muscle tension.    CERVICAL ROM:   Active ROM A/PROM (deg) eval  Flexion full  Extension WFL  Right lateral flexion WFL  Left lateral flexion WFL  Right rotation WFL  Left rotation WFL   (Blank rows = not tested)  UPPER EXTREMITY ROM:  Active ROM Right eval Left eval  Shoulder flexion 136 118 (120 AAROM, stiff end range), with reproduction of symptoms.  Shoulder extension    Shoulder abduction 155 120 (132 AAROM, stiff end feel) with reproduction of symptoms.   Shoulder adduction    Shoulder extension    Shoulder  internal rotation (functional) full L thumb to L3 spinous process. With pain  Shoulder external rotation (functional) R 3rd digit to T1 transverse process L 3rd digit to T1 transverse process  Elbow flexion    Elbow extension    Wrist flexion    Wrist extension    Wrist ulnar deviation  Wrist radial deviation    Wrist pronation    Wrist supination     (Blank rows = not tested)  UPPER EXTREMITY MMT:  MMT Right eval Left eval  Shoulder flexion 4+ 4+  Shoulder extension    Shoulder abduction 4+ 4          Shoulder internal rotation 4+ 4  Shoulder external rotation 4 4  Middle trapezius  3  Lower trapezius   4-  Elbow flexion  4  Elbow extension  4  Wrist flexion    Wrist extension    Wrist ulnar deviation    Wrist radial deviation    Wrist pronation    Wrist supination    Grip strength     (Blank rows = not tested)  SPECIAL TESTS:  (+) Hawkins-Kennedy test (-) empty can test  (+) Neer's impingement.   FUNCTIONAL TESTS:    TREATMENT DATE:  04/28/2023  Neuromuscular re-education  Supine B scapular retraction with cervical nod 10x5 seconds for 3 sets   with B shoulder extension green band 10x5 seconds for 2 sets to promote scapular strengthening and decrease B upper trap muscle tension.    With cervical rotation 10x3 each direction    With posterior pelvic tilt to decrease lumbar extension pressure   With B shoulder horizontal abduction 10x5 seconds To promote thoracic extension and posterior scapular tipping.    With posterior pelvic tilt   With face pulls (B shoulder abduction and ER) yellow band 10x3   L shoulder IR isometrics, hand on abdomen 10x5 seconds for 2 sets    Improved exercise technique, movement at target joints, use of target muscles after mod verbal, visual, tactile cues.    PATIENT EDUCATION:  Education details: POC Person educated: Patient Education method: Explanation Education comprehension: verbalized understanding  HOME  EXERCISE PROGRAM: Access Code: [email or web address] URL: [email or web address] Date: 04/07/2023 Prepared by:   Exercises - Standing Isometric Shoulder Flexion with Doorway - Arm Bent  - 2-3 x daily - 5-7 x weekly - 3 sets - 10 reps - 3-5 sec hold  Discontinued on 04/25/2023  - Standing Isometric Shoulder Abduction with Doorway - Arm Bent  - 2-3 x daily - 5-7 x weekly - 3 sets - 10 reps - 3-5 sec  hold  Discontinued on 04/25/2023  - Standing Isometric Shoulder Internal Rotation at Doorway  - 2-3 x daily - 5-7 x weekly - 3 sets - 10 reps - 3-5 sec hold  Discontinued on 04/25/2023  - Standing Isometric Shoulder External Rotation with Doorway  - 2-3 x daily - 5-7 x weekly - 3 sets - 10 reps - 3-5 sec hold  Discontinued on 04/25/2023   - Supine Shoulder Flexion Extension AAROM with Dowel  - 2-3 x daily - 5-7 x weekly - 3 sets - 10 reps - Supine Shoulder Abduction AAROM with Dowel  - 2-3 x daily - 5-7 x weekly - 3 sets - 10 reps - Single Arm Doorway Pec Stretch at 90 Degrees Abduction  - 2-3 x daily - 5-7 x weekly - 3-5 reps - 30 seconds hold   - Isometric Tricep Extension   - 1 x daily - 7 x weekly - 3 sets - 10 reps - 5 seconds hold  - Seated Shoulder Inferior Glide  - 1 x daily - 7 x weekly - 3 sets - 10 reps - 5 seconds hold - Standing Shoulder External Rotation with Resistance  - 1 x daily - 7 x weekly - 3  sets - 10 reps - 5 seconds hold  Yellow band  - Supine PNF D2 Flexion with Resistance  - 1 x daily - 7 x weekly - 3 sets - 10 reps  Yellow band  - Supine Scapular Retraction  - 1 x daily - 7 x weekly - 3 sets - 10 reps - 5 seconds hold - Supine Head Nod with Deep Neck Flexor Activation  - 3 x daily - 7 x weekly - 3 sets - 10 reps - 5 seconds hold - Supine Scapular Retraction  - 1 x daily - 7 x weekly - 3 sets - 10 reps - 5 seconds hold - Supine Head Nod with Deep Neck Flexor Activation  - 3 x daily - 7 x weekly - 3 sets - 10 reps - 5 seconds hold    ASSESSMENT:  CLINICAL  IMPRESSION:  Performed L shoulder rehab exercises in supine with activation of anterior cervical and trunk muscles to also help decrease extension stress to neck and low back. Continued working on promoting posterior and inferior movement of humeral head through muscle activation to decrease stress to anterior L shoulder when raising her arm up. Decreased L shoulder pain as well as neck and low back pain reported after session. Pt will benefit from continued skilled physical therapy services to decrease pain, stiffness, improve ROM, strength, and function.       OBJECTIVE IMPAIRMENTS: decreased ROM, decreased strength, improper body mechanics, postural dysfunction, and pain.   ACTIVITY LIMITATIONS: lifting, sleeping, bathing, dressing, reach over head, and hygiene/grooming  PARTICIPATION LIMITATIONS:   PERSONAL FACTORS: Age, Time since onset of injury/illness/exacerbation, and 1 comorbidity: arthritis  are also affecting patient's functional outcome.   REHAB POTENTIAL: Fair    CLINICAL DECISION MAKING: Stable/uncomplicated  EVALUATION COMPLEXITY: Low   GOALS: Goals reviewed with patient? Yes  SHORT TERM GOALS: Target date: 04/07/2023  Pt will be independent with her initial HEP to improve L shoulder AROM, strength, function, and ability to reach more comfortably.  Baseline: Pt has not yet started her initial HEP (03/27/2023) Goal status: INITIAL    LONG TERM GOALS: Target date: 05/26/2023  Pt will have a decrease in L shoulder pain to 3/10 or less at worst to promote ability to reach, further and more comfortably.  Baseline: 7/10 L anterior shoulder and arm pain at worst for the past 3 months (03/27/2023) Goal status: INITIAL  2.  Pt will improve her L shoulder flexion, and abduction AROM to at least 135 degrees and functional shoulder IR to L thumb to T7 spinous process to promote ability to don and doff clothing as well as promote ability to reach more comfortably.  Baseline:   Active ROM Right eval Left eval  Shoulder flexion 136 118 (120 AAROM, stiff end range), with reproduction of symptoms.  Shoulder extension    Shoulder abduction 155 120 (132 AAROM, stiff end feel) with reproduction of symptoms.           Shoulder internal rotation (functional) full L thumb to L3 spinous process. With pain  Shoulder external rotation (functional) R 3rd digit to T1 transverse process L 3rd digit to T1 transverse process  (03/27/2023)  Goal status: INITIAL  3.  Pt will improve her Upper Extremity Functional Index (UEFI) score by at least 8 points as a demonstration of improved function.  Baseline: UEFI score 63/80 (03/27/2023) Goal status: INITIAL  4.  Pt will improve her L shoulder ER, IR, middle and lower trap strength by at least  1/2 MMT to promote ability to raise her arm and reach more comfortably.  Baseline:  MMT Right eval Left eval  Shoulder internal rotation 4+ 4  Shoulder external rotation 4 4  Middle trapezius  3  Lower trapezius   4-  (03/27/2023)  Goal status: INITIAL     PLAN:  PT FREQUENCY: 2x/week  PT DURATION: 8 weeks  PLANNED INTERVENTIONS: 97110-Therapeutic exercises, 97530- Therapeutic activity, 97112- Neuromuscular re-education, 97535- Self Care, 29562- Manual therapy, G0283- Electrical stimulation (unattended), [phone]- Ionotophoresis 4mg /ml Dexamethasone, Patient/Family education, Dry Needling, and Joint mobilization  PLAN FOR NEXT SESSION: posture, thoracic extension, scapular, ER, IR strengthening, manual techniques, modalities PRN   Anaja Monts, PT, DPT 04/28/2023, 8:21 AM

## 2023-05-01 NOTE — Telephone Encounter (Signed)
 Referral changed to [place] location

## 2023-05-02 ENCOUNTER — Ambulatory Visit

## 2023-05-02 DIAGNOSIS — M5459 Other low back pain: Secondary | ICD-10-CM | POA: Diagnosis not present

## 2023-05-02 DIAGNOSIS — M25512 Pain in left shoulder: Secondary | ICD-10-CM | POA: Diagnosis not present

## 2023-05-02 DIAGNOSIS — M25612 Stiffness of left shoulder, not elsewhere classified: Secondary | ICD-10-CM

## 2023-05-02 DIAGNOSIS — R293 Abnormal posture: Secondary | ICD-10-CM | POA: Diagnosis not present

## 2023-05-02 NOTE — Therapy (Signed)
 OUTPATIENT PHYSICAL THERAPY TREATMENT   Patient Name: Natalie Sosa MRN: [id number] DOB:Oct 24, 1945, 78 y.o., female Today's Date: 05/02/2023  END OF SESSION:  PT End of Session - 05/02/23 0733     Visit Number 9    Number of Visits 17    Date for PT Re-Evaluation 05/26/23    PT Start Time 0733    PT Stop Time 0814    PT Time Calculation (min) 41 min    Activity Tolerance Patient tolerated treatment well    Behavior During Therapy [facility] for tasks assessed/performed                     Past Medical History:  Diagnosis Date   Arthritis    right knee, right thumb   Bright red rectal bleeding 09/01/2021   Cancer (HCC)    skin   COVID-19 12/31/2020   ENDOMETRIOSIS 10/04/2006   Qualifier: History of  By: Hetty Ely MD, [name]    Suprapubic pressure 09/01/2021   Past Surgical History:  Procedure Laterality Date   ACHILLES TENDON REPAIR  06/14/2004   CATARACT EXTRACTION W/PHACO Right 11/09/2022   Procedure: CATARACT EXTRACTION PHACO AND INTRAOCULAR LENS PLACEMENT (IOC) RIGHT TORIC 7.96 00:48.4;  Surgeon: Lockie Mola, MD;  Location: [facility];  Service: Ophthalmology;  Laterality: Right;   CATARACT EXTRACTION W/PHACO Left 12/07/2022   Procedure: CATARACT EXTRACTION PHACO AND INTRAOCULAR LENS PLACEMENT (IOC) LEFT  CLAREON VIVITY TORIC LENS 7.43 00:49.5;  Surgeon: Lockie Mola, MD;  Location: [facility];  Service: Ophthalmology;  Laterality: Left;   CESAREAN SECTION     placenta previa   CHOLECYSTECTOMY  06/13/2001   COLONOSCOPY WITH PROPOFOL N/A 09/24/2021   Procedure: COLONOSCOPY WITH PROPOFOL;  Surgeon: Toney Reil, MD;  Location[facility];  Service: Gastroenterology;  Laterality: N/A;   Patient Active Problem List   Diagnosis Date Noted   Adjustment reaction with anxiety and depression 03/01/2023   Osteoarthritis 02/17/2022   Encounter for annual general medical examination with abnormal findings in adult 02/17/2022    Arthritis of carpometacarpal [facility]) joint of right thumb 12/28/2021   Polyp of cecum    Unilateral primary osteoarthritis, right knee 09/22/2021   Screen for colon cancer 01/11/2016   Insomnia 01/01/2014   HYPERCHOLESTEROLEMIA 10/04/2006    PCP: Doreene Nest, NP   REFERRING PROVIDER: Kathryne Hitch, MD  REFERRING DIAG: [phone] (ICD-10-CM) - Coracoid impingement of left shoulder    THERAPY DIAG:  Left shoulder pain, unspecified chronicity  Stiffness of left shoulder, not elsewhere classified  Rationale for Evaluation and Treatment: Rehabilitation  ONSET DATE: June or July 2024  SUBJECTIVE:  SUBJECTIVE STATEMENT:   L shoulder is pretty good, no pain currently. Neck is surprisingly better. The new pillow helps.       Hand dominance: Right  PERTINENT HISTORY:  L shoulder pain. Pain is mainly in the front of her arm. Pain began suddenly almost a year ago back in June or July 2024. Unknown mechanism of injury. Pain has stayed about the same since onset. Pt knits a lot, which is the main thing she knows that she does with her L hand. Plays tennis but only uses her L hand to toss the ball up for a serve. Also getting an MRI for her low back this Friday for another issue (R dorsal foot numbness).    No latex allergies. No blood pressure problems per pt.  Has osteopenia/osteoporosis   PAIN:  Are you having pain? Yes: NPRS scale: 0/10 Pain location: L anterior shoulder and arm Pain description: sharp ache.  Aggravating factors: reaching back, putting her hand into a coat sleeve, fixing her hair, unable to don a bra secondary to L shoulder and anterior arm pain Relieving factors: resting position, rubbing that area  PRECAUTIONS: Osteopenia/Osteoporosis  RED  FLAGS: Bowel or bladder incontinence: No and Cauda equina syndrome: No     WEIGHT BEARING RESTRICTIONS: No  FALLS:  Has patient fallen in last 6 months? No  LIVING ENVIRONMENT: Lives with: lives with their spouse Lives in: House/apartment Stairs: No Has following equipment at home: None  OCCUPATION: retired  PLOF: Independent  PATIENT GOALS: Be able to don and doff clothes such as a bra and to be able to sleep on her L side/shoulder.   NEXT MD VISIT: 04/17/2023  OBJECTIVE:  Note: Objective measures were completed at Evaluation unless otherwise noted.  DIAGNOSTIC FINDINGS:  XR Shoulder Left  03/20/2023 Result Narrative  3 views of the left shoulder show no acute findings.  The shoulder is well located.  There is no significant glenohumeral arthritis.  The humeral head is not high riding in the subacromial outlet is well-maintained.      PATIENT SURVEYS:  UEFI score 63/80 (03/27/2023)  COGNITION: Overall cognitive status: Within functional limits for tasks assessed  SENSATION: WFL  POSTURE: forward neck, movement preference around C5/C6 area, and C3/C4 area, B protracted shoulders, R shoulder higher, thoracic kyphosis, R lateral shift   PALPATION: No TTP L shoulder, B upper trap and cervical paraspinal muscle tension.    CERVICAL ROM:   Active ROM A/PROM (deg) eval  Flexion full  Extension WFL  Right lateral flexion WFL  Left lateral flexion WFL  Right rotation WFL  Left rotation WFL   (Blank rows = not tested)  UPPER EXTREMITY ROM:  Active ROM Right eval Left eval  Shoulder flexion 136 118 (120 AAROM, stiff end range), with reproduction of symptoms.  Shoulder extension    Shoulder abduction 155 120 (132 AAROM, stiff end feel) with reproduction of symptoms.   Shoulder adduction    Shoulder extension    Shoulder internal rotation (functional) full L thumb to L3 spinous process. With pain  Shoulder external rotation (functional) R 3rd digit to T1  transverse process L 3rd digit to T1 transverse process  Elbow flexion    Elbow extension    Wrist flexion    Wrist extension    Wrist ulnar deviation    Wrist radial deviation    Wrist pronation    Wrist supination     (Blank rows = not tested)  UPPER EXTREMITY MMT:  MMT Right  eval Left eval  Shoulder flexion 4+ 4+  Shoulder extension    Shoulder abduction 4+ 4          Shoulder internal rotation 4+ 4  Shoulder external rotation 4 4  Middle trapezius  3  Lower trapezius   4-  Elbow flexion  4  Elbow extension  4  Wrist flexion    Wrist extension    Wrist ulnar deviation    Wrist radial deviation    Wrist pronation    Wrist supination    Grip strength     (Blank rows = not tested)  SPECIAL TESTS:  (+) Hawkins-Kennedy test (-) empty can test  (+) Neer's impingement.   FUNCTIONAL TESTS:    TREATMENT DATE:  05/02/2023  Neuromuscular re-education  L shoulder AROM flexion, abduction, ER, IR  Manually resisted L shoulder ER, IR, lower and middle trap raise   Reviewed progress with PT towards goals.   Wall push-ups 10x  Then push - ups at treadmill bar 10x2   Improved functional IR afterwards  L shoulder cross arm stretch 30 seconds x 3  Standing L shoulder IR isometrics, hand on abdomen 10x3 with 5 seconds   Improved functional IR   Seated L shoulder self inferior mob 10x5 seconds for 3 sets  Improved functional IR     Improved exercise technique, movement at target joints, use of target muscles after mod verbal, visual, tactile cues.    PATIENT EDUCATION:  Education details: POC Person educated: Patient Education method: Explanation Education comprehension: verbalized understanding  HOME EXERCISE PROGRAM: Access Code: [email or web address] URL: [email or web address] Date: 04/07/2023 Prepared by:   Exercises - Standing Isometric Shoulder Flexion with Doorway - Arm Bent  - 2-3 x daily - 5-7 x weekly - 3 sets - 10 reps - 3-5 sec  hold  Discontinued on 04/25/2023  - Standing Isometric Shoulder Abduction with Doorway - Arm Bent  - 2-3 x daily - 5-7 x weekly - 3 sets - 10 reps - 3-5 sec  hold  Discontinued on 04/25/2023  - Standing Isometric Shoulder Internal Rotation at Doorway  - 2-3 x daily - 5-7 x weekly - 3 sets - 10 reps - 3-5 sec hold  Discontinued on 04/25/2023  - Standing Isometric Shoulder External Rotation with Doorway  - 2-3 x daily - 5-7 x weekly - 3 sets - 10 reps - 3-5 sec hold  Discontinued on 04/25/2023   - Supine Shoulder Flexion Extension AAROM with Dowel  - 2-3 x daily - 5-7 x weekly - 3 sets - 10 reps - Supine Shoulder Abduction AAROM with Dowel  - 2-3 x daily - 5-7 x weekly - 3 sets - 10 reps - Single Arm Doorway Pec Stretch at 90 Degrees Abduction  - 2-3 x daily - 5-7 x weekly - 3-5 reps - 30 seconds hold   - Isometric Tricep Extension   - 1 x daily - 7 x weekly - 3 sets - 10 reps - 5 seconds hold  - Seated Shoulder Inferior Glide  - 1 x daily - 7 x weekly - 3 sets - 10 reps - 5 seconds hold - Standing Shoulder External Rotation with Resistance  - 1 x daily - 7 x weekly - 3 sets - 10 reps - 5 seconds hold  Yellow band  - Supine PNF D2 Flexion with Resistance  - 1 x daily - 7 x weekly - 3 sets - 10 reps  Yellow band  - Supine Scapular Retraction  -  1 x daily - 7 x weekly - 3 sets - 10 reps - 5 seconds hold - Supine Head Nod with Deep Neck Flexor Activation  - 3 x daily - 7 x weekly - 3 sets - 10 reps - 5 seconds hold - Supine Scapular Retraction  - 1 x daily - 7 x weekly - 3 sets - 10 reps - 5 seconds hold - Supine Head Nod with Deep Neck Flexor Activation  - 3 x daily - 7 x weekly - 3 sets - 10 reps - 5 seconds hold - Supine Cervical Rotation AROM on Pillow  - 3 x daily - 7 x weekly - 3 sets - 10 reps  L shoulder IR isometrics, hand on abdomen 10x5 seconds for 3 sets  ASSESSMENT:  CLINICAL IMPRESSION: Pt demonstrates overall improved L shoulder AROM, strength and decreased pain since initial  evaluation. Pt making very good progress with PT towards goals.  Continued working on promoting posterior and inferior movement of humeral head through muscle activation to decrease stress to anterior L shoulder when raising her arm up. Improved functional shoulder IR after treatment. Pt tolerated session well without aggravation of symptoms. Pt will benefit from continued skilled physical therapy services to decrease pain, stiffness, improve ROM, strength, and function.       OBJECTIVE IMPAIRMENTS: decreased ROM, decreased strength, improper body mechanics, postural dysfunction, and pain.   ACTIVITY LIMITATIONS: lifting, sleeping, bathing, dressing, reach over head, and hygiene/grooming  PARTICIPATION LIMITATIONS:   PERSONAL FACTORS: Age, Time since onset of injury/illness/exacerbation, and 1 comorbidity: arthritis  are also affecting patient's functional outcome.   REHAB POTENTIAL: Fair    CLINICAL DECISION MAKING: Stable/uncomplicated  EVALUATION COMPLEXITY: Low   GOALS: Goals reviewed with patient? Yes  SHORT TERM GOALS: Target date: 04/07/2023  Pt will be independent with her initial HEP to improve L shoulder AROM, strength, function, and ability to reach more comfortably.  Baseline: Pt has not yet started her initial HEP (03/27/2023); Able to do her HEP, no questions (05/02/2023) Goal status: MET    LONG TERM GOALS: Target date: 05/26/2023  Pt will have a decrease in L shoulder pain to 3/10 or less at worst to promote ability to reach, further and more comfortably.  Baseline: 7/10 L anterior shoulder and arm pain at worst for the past 3 months (03/27/2023); 5/10 L shoulder pain at most for the past 7 days (05/02/2023) Goal status: PROGRESSING  2.  Pt will improve her L shoulder flexion, and abduction AROM to at least 135 degrees and functional shoulder IR to L thumb to T7 spinous process to promote ability to don and doff clothing as well as promote ability to reach more comfortably.   Baseline:  Active ROM Right eval Left eval L (05/02/2023)  Shoulder flexion 136 118 (120 AAROM, stiff end range), with reproduction of symptoms. 131 degrees, no pain  Shoulder extension     Shoulder abduction 155 120 (132 AAROM, stiff end feel) with reproduction of symptoms.  136 degrees, no pain            Shoulder internal rotation (functional) full L thumb to L3 spinous process. With pain L thumb to T9 spinous process, slight pain  Shoulder external rotation (functional) R 3rd digit to T1 transverse process L 3rd digit to T1 transverse process L third digit to R superior scapular angle  (03/27/2023)  Goal status: PROGRESSING  3.  Pt will improve her Upper Extremity Functional Index (UEFI) score by at least 8 points  as a demonstration of improved function.  Baseline: UEFI score 63/80 (03/27/2023) Goal status: INITIAL  4.  Pt will improve her L shoulder ER, IR, middle and lower trap strength by at least 1/2 MMT to promote ability to raise her arm and reach more comfortably.  Baseline:  MMT Right eval Left eval L (05/02/2023)  Shoulder internal rotation 4+ 4 4+  Shoulder external rotation 4 4 4+  Middle trapezius  3 4-  Lower trapezius   4- 4-  (03/27/2023)  Goal status: INITIAL     PLAN:  PT FREQUENCY: 2x/week  PT DURATION: 8 weeks  PLANNED INTERVENTIONS: 97110-Therapeutic exercises, 97530- Therapeutic activity, 97112- Neuromuscular re-education, 97535- Self Care, 65784- Manual therapy, G0283- Electrical stimulation (unattended), [id number]- Ionotophoresis 4mg /ml Dexamethasone, Patient/Family education, Dry Needling, and Joint mobilization  PLAN FOR NEXT SESSION: posture, thoracic extension, scapular, ER, IR strengthening, manual techniques, modalities PRN   Jamicheal Heard, PT, DPT 05/02/2023, 10:46 AM

## 2023-05-04 ENCOUNTER — Telehealth: Payer: Self-pay | Admitting: Orthopaedic Surgery

## 2023-05-04 ENCOUNTER — Ambulatory Visit

## 2023-05-04 DIAGNOSIS — M25612 Stiffness of left shoulder, not elsewhere classified: Secondary | ICD-10-CM

## 2023-05-04 DIAGNOSIS — R293 Abnormal posture: Secondary | ICD-10-CM | POA: Diagnosis not present

## 2023-05-04 DIAGNOSIS — M5459 Other low back pain: Secondary | ICD-10-CM | POA: Diagnosis not present

## 2023-05-04 DIAGNOSIS — M25512 Pain in left shoulder: Secondary | ICD-10-CM

## 2023-05-04 NOTE — Telephone Encounter (Signed)
 Patient called and need a prescription for PT for her back. Can you fax it to [phone]

## 2023-05-04 NOTE — Telephone Encounter (Signed)
 Referral faxed to [facility] [place] at [phone]

## 2023-05-04 NOTE — Telephone Encounter (Signed)
 Patient called and said [facility] in [place] didn't receive the prescription. She wanted to know if could fax it. FAX# [phone]

## 2023-05-04 NOTE — Telephone Encounter (Signed)
 [facility] [employer]. [phone]

## 2023-05-04 NOTE — Therapy (Addendum)
 OUTPATIENT PHYSICAL THERAPY TREATMENT And Progress Report (03/27/2023 - 05/04/2023)   Patient Name: Natalie Sosa MRN: [id number] DOB:10-Feb-1945, 78 y.o., female Today's Date: 05/04/2023  END OF SESSION:  PT End of Session - 05/04/23 0816     Visit Number 10    Number of Visits 17    Date for PT Re-Evaluation 05/26/23    PT Start Time 0816    PT Stop Time 0856    PT Time Calculation (min) 40 min    Activity Tolerance Patient tolerated treatment well    Behavior During Therapy [facility] for tasks assessed/performed                      Past Medical History:  Diagnosis Date   Arthritis    right knee, right thumb   Bright red rectal bleeding 09/01/2021   Cancer (HCC)    skin   COVID-19 12/31/2020   ENDOMETRIOSIS 10/04/2006   Qualifier: History of  By: Roxine Cordia MD, Candie Chamber    Suprapubic pressure 09/01/2021   Past Surgical History:  Procedure Laterality Date   ACHILLES TENDON REPAIR  06/14/2004   CATARACT EXTRACTION W/PHACO Right 11/09/2022   Procedure: CATARACT EXTRACTION PHACO AND INTRAOCULAR LENS PLACEMENT (IOC) RIGHT TORIC 7.96 00:48.4;  Surgeon: Annell Kidney, MD;  Location: [facility];  Service: Ophthalmology;  Laterality: Right;   CATARACT EXTRACTION W/PHACO Left 12/07/2022   Procedure: CATARACT EXTRACTION PHACO AND INTRAOCULAR LENS PLACEMENT (IOC) LEFT  CLAREON VIVITY TORIC LENS 7.43 00:49.5;  Surgeon: Annell Kidney, MD;  Location: [facility];  Service: Ophthalmology;  Laterality: Left;   CESAREAN SECTION     placenta previa   CHOLECYSTECTOMY  06/13/2001   COLONOSCOPY WITH PROPOFOL N/A 09/24/2021   Procedure: COLONOSCOPY WITH PROPOFOL;  Surgeon: Selena Daily, MD;  Location: [facility];  Service: Gastroenterology;  Laterality: N/A;   Patient Active Problem List   Diagnosis Date Noted   Adjustment reaction with anxiety and depression 03/01/2023   Osteoarthritis 02/17/2022   Encounter for annual general medical  examination with abnormal findings in adult 02/17/2022   Arthritis of carpometacarpal [facility]) joint of right thumb 12/28/2021   Polyp of cecum    Unilateral primary osteoarthritis, right knee 09/22/2021   Screen for colon cancer 01/11/2016   Insomnia 01/01/2014   HYPERCHOLESTEROLEMIA 10/04/2006    PCP: Gabriel John, NP   REFERRING PROVIDER: Arnie Lao, MD  REFERRING DIAG: [phone] (ICD-10-CM) - Coracoid impingement of left shoulder    THERAPY DIAG:  Left shoulder pain, unspecified chronicity  Stiffness of left shoulder, not elsewhere classified  Rationale for Evaluation and Treatment: Rehabilitation  ONSET DATE: June or July 2024  SUBJECTIVE:  SUBJECTIVE STATEMENT:   No pain currently. Neck and low back as also doing well.       Hand dominance: Right  PERTINENT HISTORY:  L shoulder pain. Pain is mainly in the front of her arm. Pain began suddenly almost a year ago back in June or July 2024. Unknown mechanism of injury. Pain has stayed about the same since onset. Pt knits a lot, which is the main thing she knows that she does with her L hand. Plays tennis but only uses her L hand to toss the ball up for a serve. Also getting an MRI for her low back this Friday for another issue (R dorsal foot numbness).    No latex allergies. No blood pressure problems per pt.  Has osteopenia/osteoporosis   PAIN:  Are you having pain? Yes: NPRS scale: 0/10 Pain location: L anterior shoulder and arm Pain description: sharp ache.  Aggravating factors: reaching back, putting her hand into a coat sleeve, fixing her hair, unable to don a bra secondary to L shoulder and anterior arm pain Relieving factors: resting position, rubbing that area  PRECAUTIONS:  Osteopenia/Osteoporosis  RED FLAGS: Bowel or bladder incontinence: No and Cauda equina syndrome: No     WEIGHT BEARING RESTRICTIONS: No  FALLS:  Has patient fallen in last 6 months? No  LIVING ENVIRONMENT: Lives with: lives with their spouse Lives in: House/apartment Stairs: No Has following equipment at home: None  OCCUPATION: retired  PLOF: Independent  PATIENT GOALS: Be able to don and doff clothes such as a bra and to be able to sleep on her L side/shoulder.   NEXT MD VISIT: 04/17/2023  OBJECTIVE:  Note: Objective measures were completed at Evaluation unless otherwise noted.  DIAGNOSTIC FINDINGS:  XR Shoulder Left  03/20/2023 Result Narrative  3 views of the left shoulder show no acute findings.  The shoulder is well located.  There is no significant glenohumeral arthritis.  The humeral head is not high riding in the subacromial outlet is well-maintained.      PATIENT SURVEYS:  UEFI score 63/80 (03/27/2023)  COGNITION: Overall cognitive status: Within functional limits for tasks assessed  SENSATION: WFL  POSTURE: forward neck, movement preference around C5/C6 area, and C3/C4 area, B protracted shoulders, R shoulder higher, thoracic kyphosis, R lateral shift   PALPATION: No TTP L shoulder, B upper trap and cervical paraspinal muscle tension.    CERVICAL ROM:   Active ROM A/PROM (deg) eval  Flexion full  Extension WFL  Right lateral flexion WFL  Left lateral flexion WFL  Right rotation WFL  Left rotation WFL   (Blank rows = not tested)  UPPER EXTREMITY ROM:  Active ROM Right eval Left eval  Shoulder flexion 136 118 (120 AAROM, stiff end range), with reproduction of symptoms.  Shoulder extension    Shoulder abduction 155 120 (132 AAROM, stiff end feel) with reproduction of symptoms.   Shoulder adduction    Shoulder extension    Shoulder internal rotation (functional) full L thumb to L3 spinous process. With pain  Shoulder external rotation  (functional) R 3rd digit to T1 transverse process L 3rd digit to T1 transverse process  Elbow flexion    Elbow extension    Wrist flexion    Wrist extension    Wrist ulnar deviation    Wrist radial deviation    Wrist pronation    Wrist supination     (Blank rows = not tested)  UPPER EXTREMITY MMT:  MMT Right eval Left eval  Shoulder  flexion 4+ 4+  Shoulder extension    Shoulder abduction 4+ 4          Shoulder internal rotation 4+ 4  Shoulder external rotation 4 4  Middle trapezius  3  Lower trapezius   4-  Elbow flexion  4  Elbow extension  4  Wrist flexion    Wrist extension    Wrist ulnar deviation    Wrist radial deviation    Wrist pronation    Wrist supination    Grip strength     (Blank rows = not tested)  SPECIAL TESTS:  (+) Hawkins-Kennedy test (-) empty can test  (+) Neer's impingement.   FUNCTIONAL TESTS:    TREATMENT DATE:  05/04/2023  Neuromuscular re-education  Seated thoracic extension over chair back 10x3 with 5 second holds to promote scapular retraction and improve L shoulder flexion and abduction AROM.   Seated chin tuck 10x3 with 5 second holds to promote scapular retraction and improve L shoulder flexion and abduction AROM.   push - ups at treadmill bar 10x3    TRX rows 10x3  PNF D2 extension green band  L 10x3   Seated L teres major stretch in scaption position  30 seconds x 3    Improved exercise technique, movement at target joints, use of target muscles after mod verbal, visual, tactile cues.    PATIENT EDUCATION:  Education details: POC Person educated: Patient Education method: Explanation Education comprehension: verbalized understanding  HOME EXERCISE PROGRAM: Access Code: [email or web address] URL: [email or web address] Date: 04/07/2023 Prepared by:   Exercises - Standing Isometric Shoulder Flexion with Doorway - Arm Bent  - 2-3 x daily - 5-7 x weekly - 3 sets - 10 reps - 3-5 sec hold  Discontinued on  04/25/2023  - Standing Isometric Shoulder Abduction with Doorway - Arm Bent  - 2-3 x daily - 5-7 x weekly - 3 sets - 10 reps - 3-5 sec  hold  Discontinued on 04/25/2023  - Standing Isometric Shoulder Internal Rotation at Doorway  - 2-3 x daily - 5-7 x weekly - 3 sets - 10 reps - 3-5 sec hold  Discontinued on 04/25/2023  - Standing Isometric Shoulder External Rotation with Doorway  - 2-3 x daily - 5-7 x weekly - 3 sets - 10 reps - 3-5 sec hold  Discontinued on 04/25/2023   - Supine Shoulder Flexion Extension AAROM with Dowel  - 2-3 x daily - 5-7 x weekly - 3 sets - 10 reps - Supine Shoulder Abduction AAROM with Dowel  - 2-3 x daily - 5-7 x weekly - 3 sets - 10 reps - Single Arm Doorway Pec Stretch at 90 Degrees Abduction  - 2-3 x daily - 5-7 x weekly - 3-5 reps - 30 seconds hold   - Isometric Tricep Extension   - 1 x daily - 7 x weekly - 3 sets - 10 reps - 5 seconds hold  - Seated Shoulder Inferior Glide  - 1 x daily - 7 x weekly - 3 sets - 10 reps - 5 seconds hold - Standing Shoulder External Rotation with Resistance  - 1 x daily - 7 x weekly - 3 sets - 10 reps - 5 seconds hold  Yellow band  - Supine PNF D2 Flexion with Resistance  - 1 x daily - 7 x weekly - 3 sets - 10 reps  Yellow band  - Supine Scapular Retraction  - 1 x daily - 7 x weekly - 3 sets - 10 reps -  5 seconds hold - Supine Head Nod with Deep Neck Flexor Activation  - 3 x daily - 7 x weekly - 3 sets - 10 reps - 5 seconds hold - Supine Scapular Retraction  - 1 x daily - 7 x weekly - 3 sets - 10 reps - 5 seconds hold - Supine Head Nod with Deep Neck Flexor Activation  - 3 x daily - 7 x weekly - 3 sets - 10 reps - 5 seconds hold - Supine Cervical Rotation AROM on Pillow  - 3 x daily - 7 x weekly - 3 sets - 10 reps  L shoulder IR isometrics, hand on abdomen 10x5 seconds for 3 sets  ASSESSMENT:  CLINICAL IMPRESSION: Pt demonstrates overall improved L shoulder AROM, strength and decreased pain since initial evaluation. Pt making  very good progress with PT towards goals.  Continued working on promoting posterior and inferior movement of humeral head through muscle activation to decrease stress to anterior L shoulder when raising her arm up. Worked on thoracic extension and scapular strengthening to promote better glenohumeral movement to promote ability to raise her arm up and reach more comfortably and decrease anterior shoulder impingement when doing so.  Pt tolerated session well without aggravation of symptoms. Pt will benefit from continued skilled physical therapy services to decrease pain, stiffness, improve ROM, strength, and function.       OBJECTIVE IMPAIRMENTS: decreased ROM, decreased strength, improper body mechanics, postural dysfunction, and pain.   ACTIVITY LIMITATIONS: lifting, sleeping, bathing, dressing, reach over head, and hygiene/grooming  PARTICIPATION LIMITATIONS:   PERSONAL FACTORS: Age, Time since onset of injury/illness/exacerbation, and 1 comorbidity: arthritis  are also affecting patient's functional outcome.   REHAB POTENTIAL: Fair    CLINICAL DECISION MAKING: Stable/uncomplicated  EVALUATION COMPLEXITY: Low   GOALS: Goals reviewed with patient? Yes  SHORT TERM GOALS: Target date: 04/07/2023  Pt will be independent with her initial HEP to improve L shoulder AROM, strength, function, and ability to reach more comfortably.  Baseline: Pt has not yet started her initial HEP (03/27/2023); Able to do her HEP, no questions (05/02/2023) Goal status: MET    LONG TERM GOALS: Target date: 05/26/2023  Pt will have a decrease in L shoulder pain to 3/10 or less at worst to promote ability to reach, further and more comfortably.  Baseline: 7/10 L anterior shoulder and arm pain at worst for the past 3 months (03/27/2023); 5/10 L shoulder pain at most for the past 7 days (05/02/2023) Goal status: PROGRESSING  2.  Pt will improve her L shoulder flexion, and abduction AROM to at least 135 degrees and  functional shoulder IR to L thumb to T7 spinous process to promote ability to don and doff clothing as well as promote ability to reach more comfortably.  Baseline:  Active ROM Right eval Left eval L (05/02/2023)  Shoulder flexion 136 118 (120 AAROM, stiff end range), with reproduction of symptoms. 131 degrees, no pain  Shoulder extension     Shoulder abduction 155 120 (132 AAROM, stiff end feel) with reproduction of symptoms.  136 degrees, no pain            Shoulder internal rotation (functional) full L thumb to L3 spinous process. With pain L thumb to T9 spinous process, slight pain  Shoulder external rotation (functional) R 3rd digit to T1 transverse process L 3rd digit to T1 transverse process L third digit to R superior scapular angle  (03/27/2023)  Goal status: PROGRESSING  3.  Pt will  improve her Upper Extremity Functional Index (UEFI) score by at least 8 points as a demonstration of improved function.  Baseline: UEFI score 63/80 (03/27/2023); 62 (05/04/2023) Goal status: ONGOING  4.  Pt will improve her L shoulder ER, IR, middle and lower trap strength by at least 1/2 MMT to promote ability to raise her arm and reach more comfortably.  Baseline:  MMT Right eval Left eval L (05/02/2023)  Shoulder internal rotation 4+ 4 4+  Shoulder external rotation 4 4 4+  Middle trapezius  3 4-  Lower trapezius   4- 4-  (03/27/2023)  Goal status:PARTIALLY MET     PLAN:  PT FREQUENCY: 2x/week  PT DURATION: 8 weeks  PLANNED INTERVENTIONS: 97110-Therapeutic exercises, 97530- Therapeutic activity, 97112- Neuromuscular re-education, 97535- Self Care, 09811- Manual therapy, [id number]- Electrical stimulation (unattended), [phone]- Ionotophoresis 4mg /ml Dexamethasone, Patient/Family education, Dry Needling, and Joint mobilization  PLAN FOR NEXT SESSION: posture, thoracic extension, scapular, ER, IR strengthening, manual techniques, modalities PRN   Natavia Sublette, PT, DPT 05/04/2023, 9:09  AM

## 2023-05-08 ENCOUNTER — Ambulatory Visit

## 2023-05-08 DIAGNOSIS — M25612 Stiffness of left shoulder, not elsewhere classified: Secondary | ICD-10-CM

## 2023-05-08 DIAGNOSIS — M25512 Pain in left shoulder: Secondary | ICD-10-CM | POA: Diagnosis not present

## 2023-05-08 DIAGNOSIS — M5459 Other low back pain: Secondary | ICD-10-CM | POA: Diagnosis not present

## 2023-05-08 DIAGNOSIS — R293 Abnormal posture: Secondary | ICD-10-CM | POA: Diagnosis not present

## 2023-05-08 NOTE — Therapy (Signed)
 OUTPATIENT PHYSICAL THERAPY TREATMENT   Patient Name: Natalie Sosa MRN: [id number] DOB:08-Apr-1945, 78 y.o., female Today's Date: 05/08/2023  END OF SESSION:  PT End of Session - 05/08/23 1524     Visit Number 11    Number of Visits 17    Date for PT Re-Evaluation 05/26/23    PT Start Time 1523    PT Stop Time 1607    PT Time Calculation (min) 44 min    Activity Tolerance Patient tolerated treatment well    Behavior During Therapy [facility] for tasks assessed/performed                      Past Medical History:  Diagnosis Date   Arthritis    right knee, right thumb   Bright red rectal bleeding 09/01/2021   Cancer (HCC)    skin   COVID-19 12/31/2020   ENDOMETRIOSIS 10/04/2006   Qualifier: History of  By: Roxine Cordia MD, Candie Chamber    Suprapubic pressure 09/01/2021   Past Surgical History:  Procedure Laterality Date   ACHILLES TENDON REPAIR  06/14/2004   CATARACT EXTRACTION W/PHACO Right 11/09/2022   Procedure: CATARACT EXTRACTION PHACO AND INTRAOCULAR LENS PLACEMENT (IOC) RIGHT TORIC 7.96 00:48.4;  Surgeon: Annell Kidney, MD;  Location: [facility];  Service: Ophthalmology;  Laterality: Right;   CATARACT EXTRACTION W/PHACO Left 12/07/2022   Procedure: CATARACT EXTRACTION PHACO AND INTRAOCULAR LENS PLACEMENT (IOC) LEFT  CLAREON VIVITY TORIC LENS 7.43 00:49.5;  Surgeon: Annell Kidney, MD;  Location: [facility];  Service: Ophthalmology;  Laterality: Left;   CESAREAN SECTION     placenta previa   CHOLECYSTECTOMY  06/13/2001   COLONOSCOPY WITH PROPOFOL N/A 09/24/2021   Procedure: COLONOSCOPY WITH PROPOFOL;  Surgeon: Selena Daily, MD;  Location: [facility];  Service: Gastroenterology;  Laterality: N/A;   Patient Active Problem List   Diagnosis Date Noted   Adjustment reaction with anxiety and depression 03/01/2023   Osteoarthritis 02/17/2022   Encounter for annual general medical examination with abnormal findings in adult  02/17/2022   Arthritis of carpometacarpal [facility]) joint of right thumb 12/28/2021   Polyp of cecum    Unilateral primary osteoarthritis, right knee 09/22/2021   Screen for colon cancer 01/11/2016   Insomnia 01/01/2014   HYPERCHOLESTEROLEMIA 10/04/2006    PCP: Gabriel John, NP   REFERRING PROVIDER: Arnie Lao, MD  REFERRING DIAG: [phone] (ICD-10-CM) - Coracoid impingement of left shoulder    THERAPY DIAG:  Left shoulder pain, unspecified chronicity  Stiffness of left shoulder, not elsewhere classified  Rationale for Evaluation and Treatment: Rehabilitation  ONSET DATE: June or July 2024  SUBJECTIVE:  SUBJECTIVE STATEMENT:   L shoulder is pretty good.       Hand dominance: Right  PERTINENT HISTORY:  L shoulder pain. Pain is mainly in the front of her arm. Pain began suddenly almost a year ago back in June or July 2024. Unknown mechanism of injury. Pain has stayed about the same since onset. Pt knits a lot, which is the main thing she knows that she does with her L hand. Plays tennis but only uses her L hand to toss the ball up for a serve. Also getting an MRI for her low back this Friday for another issue (R dorsal foot numbness).    No latex allergies. No blood pressure problems per pt.  Has osteopenia/osteoporosis   PAIN:  Are you having pain? Yes: NPRS scale: 0/10 Pain location: L anterior shoulder and arm Pain description: sharp ache.  Aggravating factors: reaching back, putting her hand into a coat sleeve, fixing her hair, unable to don a bra secondary to L shoulder and anterior arm pain Relieving factors: resting position, rubbing that area  PRECAUTIONS: Osteopenia/Osteoporosis  RED FLAGS: Bowel or bladder incontinence: No and Cauda equina  syndrome: No     WEIGHT BEARING RESTRICTIONS: No  FALLS:  Has patient fallen in last 6 months? No  LIVING ENVIRONMENT: Lives with: lives with their spouse Lives in: House/apartment Stairs: No Has following equipment at home: None  OCCUPATION: retired  PLOF: Independent  PATIENT GOALS: Be able to don and doff clothes such as a bra and to be able to sleep on her L side/shoulder.   NEXT MD VISIT: 04/17/2023  OBJECTIVE:  Note: Objective measures were completed at Evaluation unless otherwise noted.  DIAGNOSTIC FINDINGS:  XR Shoulder Left  03/20/2023 Result Narrative  3 views of the left shoulder show no acute findings.  The shoulder is well located.  There is no significant glenohumeral arthritis.  The humeral head is not high riding in the subacromial outlet is well-maintained.      PATIENT SURVEYS:  UEFI score 63/80 (03/27/2023)  COGNITION: Overall cognitive status: Within functional limits for tasks assessed  SENSATION: WFL  POSTURE: forward neck, movement preference around C5/C6 area, and C3/C4 area, B protracted shoulders, R shoulder higher, thoracic kyphosis, R lateral shift   PALPATION: No TTP L shoulder, B upper trap and cervical paraspinal muscle tension.    CERVICAL ROM:   Active ROM A/PROM (deg) eval  Flexion full  Extension WFL  Right lateral flexion WFL  Left lateral flexion WFL  Right rotation WFL  Left rotation WFL   (Blank rows = not tested)  UPPER EXTREMITY ROM:  Active ROM Right eval Left eval  Shoulder flexion 136 118 (120 AAROM, stiff end range), with reproduction of symptoms.  Shoulder extension    Shoulder abduction 155 120 (132 AAROM, stiff end feel) with reproduction of symptoms.   Shoulder adduction    Shoulder extension    Shoulder internal rotation (functional) full L thumb to L3 spinous process. With pain  Shoulder external rotation (functional) R 3rd digit to T1 transverse process L 3rd digit to T1 transverse process   Elbow flexion    Elbow extension    Wrist flexion    Wrist extension    Wrist ulnar deviation    Wrist radial deviation    Wrist pronation    Wrist supination     (Blank rows = not tested)  UPPER EXTREMITY MMT:  MMT Right eval Left eval  Shoulder flexion 4+ 4+  Shoulder extension  Shoulder abduction 4+ 4          Shoulder internal rotation 4+ 4  Shoulder external rotation 4 4  Middle trapezius  3  Lower trapezius   4-  Elbow flexion  4  Elbow extension  4  Wrist flexion    Wrist extension    Wrist ulnar deviation    Wrist radial deviation    Wrist pronation    Wrist supination    Grip strength     (Blank rows = not tested)  SPECIAL TESTS:  (+) Hawkins-Kennedy test (-) empty can test  (+) Neer's impingement.   FUNCTIONAL TESTS:    TREATMENT DATE:  05/08/2023  Neuromuscular re-education  push - ups at treadmill bar 10x3  Standing face pulls yellow band 10x2  Seated triceps extension with arm at 90 degrees flexion, green band 10x  Then with yellow band resisting ER 10x2  Seated triceps extension with arm at 90 degrees scaption, green band 10x  Then with yellow band resisting ER 10x2  Doorway pectoralis stretch L with arm in 45 degrees abduction 30 seconds x 3  Seated L teres major stretch in scaption position  30 seconds x 3  Seated thoracic extension over chair back 10x3 with 5 second holds to promote scapular retraction and improve L shoulder flexion and abduction AROM.    TRX rows 10x3  PNF D2 extension green band  L 10x3      Improved exercise technique, movement at target joints, use of target muscles after mod verbal, visual, tactile cues.    PATIENT EDUCATION:  Education details: POC Person educated: Patient Education method: Explanation Education comprehension: verbalized understanding  HOME EXERCISE PROGRAM: Access Code: [email or web address] URL: [email or web address] Date: 04/07/2023 Prepared by:   Exercises -  Standing Isometric Shoulder Flexion with Doorway - Arm Bent  - 2-3 x daily - 5-7 x weekly - 3 sets - 10 reps - 3-5 sec hold  Discontinued on 04/25/2023  - Standing Isometric Shoulder Abduction with Doorway - Arm Bent  - 2-3 x daily - 5-7 x weekly - 3 sets - 10 reps - 3-5 sec  hold  Discontinued on 04/25/2023  - Standing Isometric Shoulder Internal Rotation at Doorway  - 2-3 x daily - 5-7 x weekly - 3 sets - 10 reps - 3-5 sec hold  Discontinued on 04/25/2023  - Standing Isometric Shoulder External Rotation with Doorway  - 2-3 x daily - 5-7 x weekly - 3 sets - 10 reps - 3-5 sec hold  Discontinued on 04/25/2023   - Supine Shoulder Flexion Extension AAROM with Dowel  - 2-3 x daily - 5-7 x weekly - 3 sets - 10 reps - Supine Shoulder Abduction AAROM with Dowel  - 2-3 x daily - 5-7 x weekly - 3 sets - 10 reps - Single Arm Doorway Pec Stretch at 90 Degrees Abduction  - 2-3 x daily - 5-7 x weekly - 3-5 reps - 30 seconds hold   - Isometric Tricep Extension   - 1 x daily - 7 x weekly - 3 sets - 10 reps - 5 seconds hold  - Seated Shoulder Inferior Glide  - 1 x daily - 7 x weekly - 3 sets - 10 reps - 5 seconds hold - Standing Shoulder External Rotation with Resistance  - 1 x daily - 7 x weekly - 3 sets - 10 reps - 5 seconds hold  Yellow band  - Supine PNF D2 Flexion with Resistance  - 1 x daily -  7 x weekly - 3 sets - 10 reps  Yellow band  - Supine Scapular Retraction  - 1 x daily - 7 x weekly - 3 sets - 10 reps - 5 seconds hold - Supine Head Nod with Deep Neck Flexor Activation  - 3 x daily - 7 x weekly - 3 sets - 10 reps - 5 seconds hold - Supine Scapular Retraction  - 1 x daily - 7 x weekly - 3 sets - 10 reps - 5 seconds hold - Supine Head Nod with Deep Neck Flexor Activation  - 3 x daily - 7 x weekly - 3 sets - 10 reps - 5 seconds hold - Supine Cervical Rotation AROM on Pillow  - 3 x daily - 7 x weekly - 3 sets - 10 reps  L shoulder IR isometrics, hand on abdomen 10x5 seconds for 3  sets  ASSESSMENT:  CLINICAL IMPRESSION:  Continued working on promoting posterior and inferior movement of humeral head through muscle activation to decrease stress to anterior L shoulder when raising her arm up. Continued working on thoracic extension and scapular strengthening to promote better glenohumeral movement to promote ability to raise her arm up and reach more comfortably and decrease anterior shoulder impingement when doing so.  Pt tolerated session well without aggravation of symptoms. Pt will benefit from continued skilled physical therapy services to decrease pain, stiffness, improve ROM, strength, and function.       OBJECTIVE IMPAIRMENTS: decreased ROM, decreased strength, improper body mechanics, postural dysfunction, and pain.   ACTIVITY LIMITATIONS: lifting, sleeping, bathing, dressing, reach over head, and hygiene/grooming  PARTICIPATION LIMITATIONS:   PERSONAL FACTORS: Age, Time since onset of injury/illness/exacerbation, and 1 comorbidity: arthritis  are also affecting patient's functional outcome.   REHAB POTENTIAL: Fair    CLINICAL DECISION MAKING: Stable/uncomplicated  EVALUATION COMPLEXITY: Low   GOALS: Goals reviewed with patient? Yes  SHORT TERM GOALS: Target date: 04/07/2023  Pt will be independent with her initial HEP to improve L shoulder AROM, strength, function, and ability to reach more comfortably.  Baseline: Pt has not yet started her initial HEP (03/27/2023); Able to do her HEP, no questions (05/02/2023) Goal status: MET    LONG TERM GOALS: Target date: 05/26/2023  Pt will have a decrease in L shoulder pain to 3/10 or less at worst to promote ability to reach, further and more comfortably.  Baseline: 7/10 L anterior shoulder and arm pain at worst for the past 3 months (03/27/2023); 5/10 L shoulder pain at most for the past 7 days (05/02/2023) Goal status: PROGRESSING  2.  Pt will improve her L shoulder flexion, and abduction AROM to at least 135  degrees and functional shoulder IR to L thumb to T7 spinous process to promote ability to don and doff clothing as well as promote ability to reach more comfortably.  Baseline:  Active ROM Right eval Left eval L (05/02/2023)  Shoulder flexion 136 118 (120 AAROM, stiff end range), with reproduction of symptoms. 131 degrees, no pain  Shoulder extension     Shoulder abduction 155 120 (132 AAROM, stiff end feel) with reproduction of symptoms.  136 degrees, no pain            Shoulder internal rotation (functional) full L thumb to L3 spinous process. With pain L thumb to T9 spinous process, slight pain  Shoulder external rotation (functional) R 3rd digit to T1 transverse process L 3rd digit to T1 transverse process L third digit to R superior scapular angle  (  03/27/2023)  Goal status: PROGRESSING  3.  Pt will improve her Upper Extremity Functional Index (UEFI) score by at least 8 points as a demonstration of improved function.  Baseline: UEFI score 63/80 (03/27/2023); 62 (05/04/2023) Goal status: ONGOING  4.  Pt will improve her L shoulder ER, IR, middle and lower trap strength by at least 1/2 MMT to promote ability to raise her arm and reach more comfortably.  Baseline:  MMT Right eval Left eval L (05/02/2023)  Shoulder internal rotation 4+ 4 4+  Shoulder external rotation 4 4 4+  Middle trapezius  3 4-  Lower trapezius   4- 4-  (03/27/2023)  Goal status:PARTIALLY MET     PLAN:  PT FREQUENCY: 2x/week  PT DURATION: 8 weeks  PLANNED INTERVENTIONS: 97110-Therapeutic exercises, 97530- Therapeutic activity, 97112- Neuromuscular re-education, 97535- Self Care, 16109- Manual therapy, [id number]- Electrical stimulation (unattended), [phone]- Ionotophoresis 4mg /ml Dexamethasone, Patient/Family education, Dry Needling, and Joint mobilization  PLAN FOR NEXT SESSION: posture, thoracic extension, scapular, ER, IR strengthening, manual techniques, modalities PRN   Shabree Tebbetts, PT, DPT 05/08/2023, 4:14  PM

## 2023-05-10 ENCOUNTER — Ambulatory Visit

## 2023-05-10 DIAGNOSIS — M5459 Other low back pain: Secondary | ICD-10-CM | POA: Diagnosis not present

## 2023-05-10 DIAGNOSIS — M25512 Pain in left shoulder: Secondary | ICD-10-CM | POA: Diagnosis not present

## 2023-05-10 DIAGNOSIS — R293 Abnormal posture: Secondary | ICD-10-CM

## 2023-05-10 DIAGNOSIS — M25612 Stiffness of left shoulder, not elsewhere classified: Secondary | ICD-10-CM | POA: Diagnosis not present

## 2023-05-10 NOTE — Therapy (Signed)
 OUTPATIENT PHYSICAL THERAPY TREATMENT   Patient Name: Natalie Sosa MRN: [id number] DOB:Aug 19, 1945, 78 y.o., female Today's Date: 05/10/2023  END OF SESSION:  PT End of Session - 05/10/23 1339     Visit Number 12    Number of Visits 17    Date for PT Re-Evaluation 05/26/23    PT Start Time 1345    PT Stop Time 1426    PT Time Calculation (min) 41 min    Activity Tolerance Patient tolerated treatment well    Behavior During Therapy [facility] for tasks assessed/performed               Past Medical History:  Diagnosis Date   Arthritis    right knee, right thumb   Bright red rectal bleeding 09/01/2021   Cancer (HCC)    skin   COVID-19 12/31/2020   ENDOMETRIOSIS 10/04/2006   Qualifier: History of  By: Roxine Cordia MD, Candie Chamber    Suprapubic pressure 09/01/2021   Past Surgical History:  Procedure Laterality Date   ACHILLES TENDON REPAIR  06/14/2004   CATARACT EXTRACTION W/PHACO Right 11/09/2022   Procedure: CATARACT EXTRACTION PHACO AND INTRAOCULAR LENS PLACEMENT (IOC) RIGHT TORIC 7.96 00:48.4;  Surgeon: Annell Kidney, MD;  Location: [facility];  Service: Ophthalmology;  Laterality: Right;   CATARACT EXTRACTION W/PHACO Left 12/07/2022   Procedure: CATARACT EXTRACTION PHACO AND INTRAOCULAR LENS PLACEMENT (IOC) LEFT  CLAREON VIVITY TORIC LENS 7.43 00:49.5;  Surgeon: Annell Kidney, MD;  Location: [facility];  Service: Ophthalmology;  Laterality: Left;   CESAREAN SECTION     placenta previa   CHOLECYSTECTOMY  06/13/2001   COLONOSCOPY WITH PROPOFOL N/A 09/24/2021   Procedure: COLONOSCOPY WITH PROPOFOL;  Surgeon: Selena Daily, MD;  Location: [facility];  Service: Gastroenterology;  Laterality: N/A;   Patient Active Problem List   Diagnosis Date Noted   Adjustment reaction with anxiety and depression 03/01/2023   Osteoarthritis 02/17/2022   Encounter for annual general medical examination with abnormal findings in adult 02/17/2022    Arthritis of carpometacarpal [facility]) joint of right thumb 12/28/2021   Polyp of cecum    Unilateral primary osteoarthritis, right knee 09/22/2021   Screen for colon cancer 01/11/2016   Insomnia 01/01/2014   HYPERCHOLESTEROLEMIA 10/04/2006    PCP: Gabriel John, NP   REFERRING PROVIDER: Arnie Lao, MD  REFERRING DIAG: [phone] (ICD-10-CM) - Coracoid impingement of left shoulder    THERAPY DIAG:  Left shoulder pain, unspecified chronicity  Stiffness of left shoulder, not elsewhere classified  Abnormal posture  Rationale for Evaluation and Treatment: Rehabilitation  ONSET DATE: June or July 2024  SUBJECTIVE:  SUBJECTIVE STATEMENT:   Patient reports doing well with the L shoulder. Continues to have difficulty reaching behind her back   Hand dominance: Right  PERTINENT HISTORY:  L shoulder pain. Pain is mainly in the front of her arm. Pain began suddenly almost a year ago back in June or July 2024. Unknown mechanism of injury. Pain has stayed about the same since onset. Pt knits a lot, which is the main thing she knows that she does with her L hand. Plays tennis but only uses her L hand to toss the ball up for a serve. Also getting an MRI for her low back this Friday for another issue (R dorsal foot numbness).    No latex allergies. No blood pressure problems per pt.  Has osteopenia/osteoporosis   PAIN:  Are you having pain? Yes: NPRS scale: 0/10 Pain location: L anterior shoulder and arm Pain description: sharp ache.  Aggravating factors: reaching back, putting her hand into a coat sleeve, fixing her hair, unable to don a bra secondary to L shoulder and anterior arm pain Relieving factors: resting position, rubbing that area  PRECAUTIONS:  Osteopenia/Osteoporosis  RED FLAGS: Bowel or bladder incontinence: No and Cauda equina syndrome: No     WEIGHT BEARING RESTRICTIONS: No  FALLS:  Has patient fallen in last 6 months? No  LIVING ENVIRONMENT: Lives with: lives with their spouse Lives in: House/apartment Stairs: No Has following equipment at home: None  OCCUPATION: retired  PLOF: Independent  PATIENT GOALS: Be able to don and doff clothes such as a bra and to be able to sleep on her L side/shoulder.   NEXT MD VISIT: 04/17/2023  OBJECTIVE:  Note: Objective measures were completed at Evaluation unless otherwise noted.  DIAGNOSTIC FINDINGS:  XR Shoulder Left  03/20/2023 Result Narrative  3 views of the left shoulder show no acute findings.  The shoulder is well located.  There is no significant glenohumeral arthritis.  The humeral head is not high riding in the subacromial outlet is well-maintained.      PATIENT SURVEYS:  UEFI score 63/80 (03/27/2023)  COGNITION: Overall cognitive status: Within functional limits for tasks assessed  SENSATION: WFL  POSTURE: forward neck, movement preference around C5/C6 area, and C3/C4 area, B protracted shoulders, R shoulder higher, thoracic kyphosis, R lateral shift   PALPATION: No TTP L shoulder, B upper trap and cervical paraspinal muscle tension.    CERVICAL ROM:   Active ROM A/PROM (deg) eval  Flexion full  Extension WFL  Right lateral flexion WFL  Left lateral flexion WFL  Right rotation WFL  Left rotation WFL   (Blank rows = not tested)  UPPER EXTREMITY ROM:  Active ROM Right eval Left eval  Shoulder flexion 136 118 (120 AAROM, stiff end range), with reproduction of symptoms.  Shoulder extension    Shoulder abduction 155 120 (132 AAROM, stiff end feel) with reproduction of symptoms.   Shoulder adduction    Shoulder extension    Shoulder internal rotation (functional) full L thumb to L3 spinous process. With pain  Shoulder external rotation  (functional) R 3rd digit to T1 transverse process L 3rd digit to T1 transverse process  Elbow flexion    Elbow extension    Wrist flexion    Wrist extension    Wrist ulnar deviation    Wrist radial deviation    Wrist pronation    Wrist supination     (Blank rows = not tested)  UPPER EXTREMITY MMT:  MMT Right eval Left eval  Shoulder flexion 4+ 4+  Shoulder extension    Shoulder abduction 4+ 4          Shoulder internal rotation 4+ 4  Shoulder external rotation 4 4  Middle trapezius  3  Lower trapezius   4-  Elbow flexion  4  Elbow extension  4  Wrist flexion    Wrist extension    Wrist ulnar deviation    Wrist radial deviation    Wrist pronation    Wrist supination    Grip strength     (Blank rows = not tested)  SPECIAL TESTS:  (+) Hawkins-Kennedy test (-) empty can test  (+) Neer's impingement.   FUNCTIONAL TESTS:    TREATMENT DATE:  05/10/23   TA:  UBE level 2 x 3 min fwd/3 min bwd   push - ups at treadmill bar 10x3 TRX rows 10x3 Seated chest press at OMEGA 15# x 10 , 10# 2 x 10  Standing face pulls yellow band 10x3 Doorway pectoralis stretch L with arm in 45-60-90 degrees abduction 30 seconds x 3 - able to progress to 90 after first 2 bouts Seated tricep extension with GTB 3 x 10 each UE Seated horizontal abduction with GTB 3 x 10  PNF D2 extension green band 10x3 each UE   Improved exercise technique, movement at target joints, use of target muscles after mod verbal, visual, tactile cues.    PATIENT EDUCATION:  Education details: POC Person educated: Patient Education method: Explanation Education comprehension: verbalized understanding  HOME EXERCISE PROGRAM: Access Code: [email or web address] URL: [email or web address] Date: 04/07/2023 Prepared by:   Exercises - Standing Isometric Shoulder Flexion with Doorway - Arm Bent  - 2-3 x daily - 5-7 x weekly - 3 sets - 10 reps - 3-5 sec hold  Discontinued on 04/25/2023  - Standing Isometric  Shoulder Abduction with Doorway - Arm Bent  - 2-3 x daily - 5-7 x weekly - 3 sets - 10 reps - 3-5 sec  hold  Discontinued on 04/25/2023  - Standing Isometric Shoulder Internal Rotation at Doorway  - 2-3 x daily - 5-7 x weekly - 3 sets - 10 reps - 3-5 sec hold  Discontinued on 04/25/2023  - Standing Isometric Shoulder External Rotation with Doorway  - 2-3 x daily - 5-7 x weekly - 3 sets - 10 reps - 3-5 sec hold  Discontinued on 04/25/2023   - Supine Shoulder Flexion Extension AAROM with Dowel  - 2-3 x daily - 5-7 x weekly - 3 sets - 10 reps - Supine Shoulder Abduction AAROM with Dowel  - 2-3 x daily - 5-7 x weekly - 3 sets - 10 reps - Single Arm Doorway Pec Stretch at 90 Degrees Abduction  - 2-3 x daily - 5-7 x weekly - 3-5 reps - 30 seconds hold   - Isometric Tricep Extension   - 1 x daily - 7 x weekly - 3 sets - 10 reps - 5 seconds hold  - Seated Shoulder Inferior Glide  - 1 x daily - 7 x weekly - 3 sets - 10 reps - 5 seconds hold - Standing Shoulder External Rotation with Resistance  - 1 x daily - 7 x weekly - 3 sets - 10 reps - 5 seconds hold  Yellow band  - Supine PNF D2 Flexion with Resistance  - 1 x daily - 7 x weekly - 3 sets - 10 reps  Yellow band  - Supine Scapular Retraction  - 1 x daily - 7 x  weekly - 3 sets - 10 reps - 5 seconds hold - Supine Head Nod with Deep Neck Flexor Activation  - 3 x daily - 7 x weekly - 3 sets - 10 reps - 5 seconds hold - Supine Scapular Retraction  - 1 x daily - 7 x weekly - 3 sets - 10 reps - 5 seconds hold - Supine Head Nod with Deep Neck Flexor Activation  - 3 x daily - 7 x weekly - 3 sets - 10 reps - 5 seconds hold - Supine Cervical Rotation AROM on Pillow  - 3 x daily - 7 x weekly - 3 sets - 10 reps  L shoulder IR isometrics, hand on abdomen 10x5 seconds for 3 sets  ASSESSMENT:  CLINICAL IMPRESSION:   Patient arrives to treatment session motivated to participate. Session focused on BUE strengthening with ability to increase resistance and  tolerance to weight machines in clinic. Provided patient with upgraded theraband to progress HEP. Pt tolerated session well without aggravation of symptoms. Pt will benefit from continued skilled physical therapy services to decrease pain, stiffness, improve ROM, strength, and function.    OBJECTIVE IMPAIRMENTS: decreased ROM, decreased strength, improper body mechanics, postural dysfunction, and pain.   ACTIVITY LIMITATIONS: lifting, sleeping, bathing, dressing, reach over head, and hygiene/grooming  PARTICIPATION LIMITATIONS:   PERSONAL FACTORS: Age, Time since onset of injury/illness/exacerbation, and 1 comorbidity: arthritis  are also affecting patient's functional outcome.   REHAB POTENTIAL: Fair    CLINICAL DECISION MAKING: Stable/uncomplicated  EVALUATION COMPLEXITY: Low   GOALS: Goals reviewed with patient? Yes  SHORT TERM GOALS: Target date: 04/07/2023  Pt will be independent with her initial HEP to improve L shoulder AROM, strength, function, and ability to reach more comfortably.  Baseline: Pt has not yet started her initial HEP (03/27/2023); Able to do her HEP, no questions (05/02/2023) Goal status: MET    LONG TERM GOALS: Target date: 05/26/2023  Pt will have a decrease in L shoulder pain to 3/10 or less at worst to promote ability to reach, further and more comfortably.  Baseline: 7/10 L anterior shoulder and arm pain at worst for the past 3 months (03/27/2023); 5/10 L shoulder pain at most for the past 7 days (05/02/2023) Goal status: PROGRESSING  2.  Pt will improve her L shoulder flexion, and abduction AROM to at least 135 degrees and functional shoulder IR to L thumb to T7 spinous process to promote ability to don and doff clothing as well as promote ability to reach more comfortably.  Baseline:  Active ROM Right eval Left eval L (05/02/2023)  Shoulder flexion 136 118 (120 AAROM, stiff end range), with reproduction of symptoms. 131 degrees, no pain  Shoulder extension      Shoulder abduction 155 120 (132 AAROM, stiff end feel) with reproduction of symptoms.  136 degrees, no pain            Shoulder internal rotation (functional) full L thumb to L3 spinous process. With pain L thumb to T9 spinous process, slight pain  Shoulder external rotation (functional) R 3rd digit to T1 transverse process L 3rd digit to T1 transverse process L third digit to R superior scapular angle  (03/27/2023)  Goal status: PROGRESSING  3.  Pt will improve her Upper Extremity Functional Index (UEFI) score by at least 8 points as a demonstration of improved function.  Baseline: UEFI score 63/80 (03/27/2023); 62 (05/04/2023) Goal status: ONGOING  4.  Pt will improve her L shoulder ER, IR, middle and  lower trap strength by at least 1/2 MMT to promote ability to raise her arm and reach more comfortably.  Baseline:  MMT Right eval Left eval L (05/02/2023)  Shoulder internal rotation 4+ 4 4+  Shoulder external rotation 4 4 4+  Middle trapezius  3 4-  Lower trapezius   4- 4-  (03/27/2023)  Goal status:PARTIALLY MET     PLAN:  PT FREQUENCY: 2x/week  PT DURATION: 8 weeks  PLANNED INTERVENTIONS: 97110-Therapeutic exercises, 97530- Therapeutic activity, 97112- Neuromuscular re-education, 97535- Self Care, 11914- Manual therapy, [id number]- Electrical stimulation (unattended), [id number]- Ionotophoresis 4mg /ml Dexamethasone, Patient/Family education, Dry Needling, and Joint mobilization  PLAN FOR NEXT SESSION: posture, thoracic extension, scapular, ER, IR strengthening, manual techniques, modalities PRN   Alexea Blase A Karenna Romanoff, PT, DPT 05/10/2023, 1:39 PM

## 2023-05-15 ENCOUNTER — Ambulatory Visit

## 2023-05-15 DIAGNOSIS — R293 Abnormal posture: Secondary | ICD-10-CM | POA: Diagnosis not present

## 2023-05-15 DIAGNOSIS — M25612 Stiffness of left shoulder, not elsewhere classified: Secondary | ICD-10-CM | POA: Diagnosis not present

## 2023-05-15 DIAGNOSIS — M25512 Pain in left shoulder: Secondary | ICD-10-CM | POA: Diagnosis not present

## 2023-05-15 DIAGNOSIS — M5459 Other low back pain: Secondary | ICD-10-CM | POA: Diagnosis not present

## 2023-05-15 NOTE — Therapy (Signed)
 OUTPATIENT PHYSICAL THERAPY TREATMENT And Discharge Summary    Patient Name: Natalie Sosa MRN: [id number] DOB:02-Sep-1945, 78 y.o., female Today's Date: 05/15/2023  END OF SESSION:  PT End of Session - 05/15/23 0903     Visit Number 13    Number of Visits 17    Date for PT Re-Evaluation 05/26/23    PT Start Time 0903    PT Stop Time 0945    PT Time Calculation (min) 42 min    Activity Tolerance Patient tolerated treatment well    Behavior During Therapy [facility] for tasks assessed/performed                       Past Medical History:  Diagnosis Date   Arthritis    right knee, right thumb   Bright red rectal bleeding 09/01/2021   Cancer (HCC)    skin   COVID-19 12/31/2020   ENDOMETRIOSIS 10/04/2006   Qualifier: History of  By: Roxine Cordia MD, Candie Chamber    Suprapubic pressure 09/01/2021   Past Surgical History:  Procedure Laterality Date   ACHILLES TENDON REPAIR  06/14/2004   CATARACT EXTRACTION W/PHACO Right 11/09/2022   Procedure: CATARACT EXTRACTION PHACO AND INTRAOCULAR LENS PLACEMENT (IOC) RIGHT TORIC 7.96 00:48.4;  Surgeon: Annell Kidney, MD;  Location: [facility];  Service: Ophthalmology;  Laterality: Right;   CATARACT EXTRACTION W/PHACO Left 12/07/2022   Procedure: CATARACT EXTRACTION PHACO AND INTRAOCULAR LENS PLACEMENT (IOC) LEFT  CLAREON VIVITY TORIC LENS 7.43 00:49.5;  Surgeon: Annell Kidney, MD;  Location: [facility];  Service: Ophthalmology;  Laterality: Left;   CESAREAN SECTION     placenta previa   CHOLECYSTECTOMY  06/13/2001   COLONOSCOPY WITH PROPOFOL  N/A 09/24/2021   Procedure: COLONOSCOPY WITH PROPOFOL ;  Surgeon: Selena Daily, MD;  Location: [facility] END[facility];  Service: Gastroenterology;  Laterality: N/A;   Patient Active Problem List   Diagnosis Date Noted   Adjustment reaction with anxiety and depression 03/01/2023   Osteoarthritis 02/17/2022   Encounter for annual general medical examination with  abnormal findings in adult 02/17/2022   Arthritis of carpometacarpal [facility]) joint of right thumb 12/28/2021   Polyp of cecum    Unilateral primary osteoarthritis, right knee 09/22/2021   Screen for colon cancer 01/11/2016   Insomnia 01/01/2014   HYPERCHOLESTEROLEMIA 10/04/2006    PCP: Gabriel John, NP   REFERRING PROVIDER: Arnie Lao, MD  REFERRING DIAG: [phone] (ICD-10-CM) - Coracoid impingement of left shoulder    THERAPY DIAG:  Left shoulder pain, unspecified chronicity  Stiffness of left shoulder, not elsewhere classified  Rationale for Evaluation and Treatment: Rehabilitation  ONSET DATE: June or July 2024  SUBJECTIVE:  SUBJECTIVE STATEMENT:   Wants today to be the last visit for the L shoulder for PT, and next visit can be the EVAL for her low back pain. L shoulder is pretty good, no pain currently.       Hand dominance: Right  PERTINENT HISTORY:  L shoulder pain. Pain is mainly in the front of her arm. Pain began suddenly almost a year ago back in June or July 2024. Unknown mechanism of injury. Pain has stayed about the same since onset. Pt knits a lot, which is the main thing she knows that she does with her L hand. Plays tennis but only uses her L hand to toss the ball up for a serve. Also getting an MRI for her low back this Friday for another issue (R dorsal foot numbness).    No latex allergies. No blood pressure problems per pt.  Has osteopenia/osteoporosis   PAIN:  Are you having pain? Yes: NPRS scale: 0/10 Pain location: L anterior shoulder and arm Pain description: sharp ache.  Aggravating factors: reaching back, putting her hand into a coat sleeve, fixing her hair, unable to don a bra secondary to L shoulder and anterior arm  pain Relieving factors: resting position, rubbing that area  PRECAUTIONS: Osteopenia/Osteoporosis  RED FLAGS: Bowel or bladder incontinence: No and Cauda equina syndrome: No     WEIGHT BEARING RESTRICTIONS: No  FALLS:  Has patient fallen in last 6 months? No  LIVING ENVIRONMENT: Lives with: lives with their spouse Lives in: House/apartment Stairs: No Has following equipment at home: None  OCCUPATION: retired  PLOF: Independent  PATIENT GOALS: Be able to don and doff clothes such as a bra and to be able to sleep on her L side/shoulder.   NEXT MD VISIT: 04/17/2023  OBJECTIVE:  Note: Objective measures were completed at Evaluation unless otherwise noted.  DIAGNOSTIC FINDINGS:  XR Shoulder Left  03/20/2023 Result Narrative  3 views of the left shoulder show no acute findings.  The shoulder is well located.  There is no significant glenohumeral arthritis.  The humeral head is not high riding in the subacromial outlet is well-maintained.      PATIENT SURVEYS:  UEFI score 63/80 (03/27/2023)  COGNITION: Overall cognitive status: Within functional limits for tasks assessed  SENSATION: WFL  POSTURE: forward neck, movement preference around C5/C6 area, and C3/C4 area, B protracted shoulders, R shoulder higher, thoracic kyphosis, R lateral shift   PALPATION: No TTP L shoulder, B upper trap and cervical paraspinal muscle tension.    CERVICAL ROM:   Active ROM A/PROM (deg) eval  Flexion full  Extension WFL  Right lateral flexion WFL  Left lateral flexion WFL  Right rotation WFL  Left rotation WFL   (Blank rows = not tested)  UPPER EXTREMITY ROM:  Active ROM Right eval Left eval  Shoulder flexion 136 118 (120 AAROM, stiff end range), with reproduction of symptoms.  Shoulder extension    Shoulder abduction 155 120 (132 AAROM, stiff end feel) with reproduction of symptoms.   Shoulder adduction    Shoulder extension    Shoulder internal rotation (functional)  full L thumb to L3 spinous process. With pain  Shoulder external rotation (functional) R 3rd digit to T1 transverse process L 3rd digit to T1 transverse process  Elbow flexion    Elbow extension    Wrist flexion    Wrist extension    Wrist ulnar deviation    Wrist radial deviation    Wrist pronation    Wrist  supination     (Blank rows = not tested)  UPPER EXTREMITY MMT:  MMT Right eval Left eval  Shoulder flexion 4+ 4+  Shoulder extension    Shoulder abduction 4+ 4          Shoulder internal rotation 4+ 4  Shoulder external rotation 4 4  Middle trapezius  3  Lower trapezius   4-  Elbow flexion  4  Elbow extension  4  Wrist flexion    Wrist extension    Wrist ulnar deviation    Wrist radial deviation    Wrist pronation    Wrist supination    Grip strength     (Blank rows = not tested)  SPECIAL TESTS:  (+) Hawkins-Kennedy test (-) empty can test  (+) Neer's impingement.   FUNCTIONAL TESTS:    TREATMENT DATE:  05/15/2023  Therapeutic activities:   Seated self L inferior glide of L shoulder joint 10x10 seconds for 2 sets   push - ups at treadmill bar 10x3  Standing L shoulder IR isometrics, hand on abdomen 10x3 with 5 second holds  Good posterior glide of humeral head palpated.   Standing cross arm stretch L 30 seconds x 4  L teres major muscle stretch 30 seconds x 3  OMEGA   Rows plate 15 for 16X0 second holds  TRX rows 10x2       Improved exercise technique, movement at target joints, use of target muscles after mod verbal, visual, tactile cues.    PATIENT EDUCATION:  Education details: POC Person educated: Patient Education method: Explanation Education comprehension: verbalized understanding  HOME EXERCISE PROGRAM: Access Code: [email or web address] URL: [email or web address] Date: 04/07/2023 Prepared by:   Exercises - Standing Isometric Shoulder Flexion with Doorway - Arm Bent  - 2-3 x daily - 5-7 x weekly - 3 sets - 10 reps -  3-5 sec hold  Discontinued on 04/25/2023  - Standing Isometric Shoulder Abduction with Doorway - Arm Bent  - 2-3 x daily - 5-7 x weekly - 3 sets - 10 reps - 3-5 sec  hold  Discontinued on 04/25/2023  - Standing Isometric Shoulder Internal Rotation at Doorway  - 2-3 x daily - 5-7 x weekly - 3 sets - 10 reps - 3-5 sec hold  Discontinued on 04/25/2023  - Standing Isometric Shoulder External Rotation with Doorway  - 2-3 x daily - 5-7 x weekly - 3 sets - 10 reps - 3-5 sec hold  Discontinued on 04/25/2023   - Supine Shoulder Flexion Extension AAROM with Dowel  - 2-3 x daily - 5-7 x weekly - 3 sets - 10 reps - Supine Shoulder Abduction AAROM with Dowel  - 2-3 x daily - 5-7 x weekly - 3 sets - 10 reps - Single Arm Doorway Pec Stretch at 90 Degrees Abduction  - 2-3 x daily - 5-7 x weekly - 3-5 reps - 30 seconds hold   - Isometric Tricep Extension   - 1 x daily - 7 x weekly - 3 sets - 10 reps - 5 seconds hold  - Seated Shoulder Inferior Glide  - 1 x daily - 7 x weekly - 3 sets - 10 reps - 5 seconds hold - Standing Shoulder External Rotation with Resistance  - 1 x daily - 7 x weekly - 3 sets - 10 reps - 5 seconds hold  Yellow band  - Supine PNF D2 Flexion with Resistance  - 1 x daily - 7 x weekly - 3 sets - 10 reps  Yellow band  Was provided red, green and blue bands previous session for upgrade resistance  - Supine Scapular Retraction  - 1 x daily - 7 x weekly - 3 sets - 10 reps - 5 seconds hold - Supine Head Nod with Deep Neck Flexor Activation  - 3 x daily - 7 x weekly - 3 sets - 10 reps - 5 seconds hold - Supine Scapular Retraction  - 1 x daily - 7 x weekly - 3 sets - 10 reps - 5 seconds hold - Supine Head Nod with Deep Neck Flexor Activation  - 3 x daily - 7 x weekly - 3 sets - 10 reps - 5 seconds hold - Supine Cervical Rotation AROM on Pillow  - 3 x daily - 7 x weekly - 3 sets - 10 reps  L shoulder IR isometrics, hand on abdomen 10x5 seconds for 3 sets  ASSESSMENT:  CLINICAL IMPRESSION: Pt  demonstrates overall improved L shoulder pain level, strength, function and AROM since initial evaluation. Pt has made good progress with PT towards goals and demonstrates independence with her HEP. Skilled PT services discharged with pt continuing her progress with her exercises at home.      OBJECTIVE IMPAIRMENTS: decreased ROM, decreased strength, improper body mechanics, postural dysfunction, and pain.   ACTIVITY LIMITATIONS: lifting, sleeping, bathing, dressing, reach over head, and hygiene/grooming  PARTICIPATION LIMITATIONS:   PERSONAL FACTORS: Age, Time since onset of injury/illness/exacerbation, and 1 comorbidity: arthritis  are also affecting patient's functional outcome.   REHAB POTENTIAL: Fair    CLINICAL DECISION MAKING: Stable/uncomplicated  EVALUATION COMPLEXITY: Low   GOALS: Goals reviewed with patient? Yes  SHORT TERM GOALS: Target date: 04/07/2023  Pt will be independent with her initial HEP to improve L shoulder AROM, strength, function, and ability to reach more comfortably.  Baseline: Pt has not yet started her initial HEP (03/27/2023); Able to do her HEP, no questions (05/02/2023) Goal status: MET    LONG TERM GOALS: Target date: 05/26/2023  Pt will have a decrease in L shoulder pain to 3/10 or less at worst to promote ability to reach, further and more comfortably.  Baseline: 7/10 L anterior shoulder and arm pain at worst for the past 3 months (03/27/2023); 5/10 L shoulder pain at most for the past 7 days (05/02/2023), 5/10 at most for the past 7 days only when trying to reach behind her other than that, it dos not bother her at all (05/15/2023) Goal status: Potentially met  2.  Pt will improve her L shoulder flexion, and abduction AROM to at least 135 degrees and functional shoulder IR to L thumb to T7 spinous process to promote ability to don and doff clothing as well as promote ability to reach more comfortably.  Baseline:  Active ROM Right eval Left eval  L (05/02/2023)  Shoulder flexion 136 118 (120 AAROM, stiff end range), with reproduction of symptoms. 131 degrees, no pain  Shoulder extension     Shoulder abduction 155 120 (132 AAROM, stiff end feel) with reproduction of symptoms.  136 degrees, no pain            Shoulder internal rotation (functional) full L thumb to L3 spinous process. With pain L thumb to T9 spinous process, slight pain  Shoulder external rotation (functional) R 3rd digit to T1 transverse process L 3rd digit to T1 transverse process L third digit to R superior scapular angle  (03/27/2023)   Active ROM   Left eval L (05/02/2023) L (05/15/2023)  Shoulder flexion  118 (120 AAROM, stiff end range), with reproduction of symptoms. 131 degrees, no pain 135 degrees AROM, no pain  Shoulder extension      Shoulder abduction  120 (132 AAROM, stiff end feel) with reproduction of symptoms.  136 degrees, no pain 136 degrees, no pain              Shoulder internal rotation (functional)  L thumb to L3 spinous process. With pain L thumb to T9 spinous process, slight pain L 3rd digit to T6 spinous process  Shoulder external rotation (functional)  L 3rd digit to T1 transverse process L third digit to R superior scapular angle     Goal status: MET  3.  Pt will improve her Upper Extremity Functional Index (UEFI) score by at least 8 points as a demonstration of improved function.  Baseline: UEFI score 63/80 (03/27/2023); 62 (05/04/2023); 77/80 Goal status: MET  4.  Pt will improve her L shoulder ER, IR, middle and lower trap strength by at least 1/2 MMT to promote ability to raise her arm and reach more comfortably.  Baseline:  MMT Right eval Left eval L (05/02/2023)  Shoulder internal rotation 4+ 4 4+  Shoulder external rotation 4 4 4+  Middle trapezius  3 4-  Lower trapezius   4- 4-  (03/27/2023)  Goal status:PARTIALLY MET     PLAN:  PT FREQUENCY: 2x/week  PT DURATION: 8 weeks  PLANNED INTERVENTIONS: 97110-Therapeutic  exercises, 97530- Therapeutic activity, 97112- Neuromuscular re-education, 97535- Self Care, 96295- Manual therapy, [id number]- Electrical stimulation (unattended), [id number]- Ionotophoresis 4mg /ml Dexamethasone, Patient/Family education, Dry Needling, and Joint mobilization  PLAN FOR NEXT SESSION: posture, thoracic extension, scapular, ER, IR strengthening, manual techniques, modalities PRN  Thank you for your referral.   Lindsay Straka, PT, DPT 05/15/2023, 11:45 AM

## 2023-05-17 ENCOUNTER — Ambulatory Visit

## 2023-05-17 DIAGNOSIS — M5459 Other low back pain: Secondary | ICD-10-CM

## 2023-05-17 DIAGNOSIS — R293 Abnormal posture: Secondary | ICD-10-CM | POA: Diagnosis not present

## 2023-05-17 DIAGNOSIS — M25512 Pain in left shoulder: Secondary | ICD-10-CM | POA: Diagnosis not present

## 2023-05-17 DIAGNOSIS — M25612 Stiffness of left shoulder, not elsewhere classified: Secondary | ICD-10-CM | POA: Diagnosis not present

## 2023-05-17 NOTE — Therapy (Signed)
 OUTPATIENT PHYSICAL THERAPY EVALUATION   Patient Name: Natalie Sosa MRN: [id number] DOB:[birth year], 78 y.o., female Today's Date: 05/17/2023  END OF SESSION:  PT End of Session - 05/17/23 0903     Visit Number 1    Number of Visits 17    Date for PT Re-Evaluation 07/14/23    PT Start Time 0904    PT Stop Time 0952    PT Time Calculation (min) 48 min    Activity Tolerance Patient tolerated treatment well    Behavior During Therapy [facility] for tasks assessed/performed             Past Medical History:  Diagnosis Date   Arthritis    right knee, right thumb   Bright red rectal bleeding 09/01/2021   Cancer (HCC)    skin   COVID-19 12/31/2020   ENDOMETRIOSIS 10/04/2006   Qualifier: History of  By: Roxine Cordia MD, Candie Chamber    Suprapubic pressure 09/01/2021   Past Surgical History:  Procedure Laterality Date   ACHILLES TENDON REPAIR  06/14/2004   CATARACT EXTRACTION W/PHACO Right 11/09/2022   Procedure: CATARACT EXTRACTION PHACO AND INTRAOCULAR LENS PLACEMENT (IOC) RIGHT TORIC 7.96 00:48.4;  Surgeon: Annell Kidney, MD;  Location: [facility];  Service: Ophthalmology;  Laterality: Right;   CATARACT EXTRACTION W/PHACO Left 12/07/2022   Procedure: CATARACT EXTRACTION PHACO AND INTRAOCULAR LENS PLACEMENT (IOC) LEFT  CLAREON VIVITY TORIC LENS 7.43 00:49.5;  Surgeon: Annell Kidney, MD;  Location: [facility];  Service: Ophthalmology;  Laterality: Left;   CESAREAN SECTION     placenta previa   CHOLECYSTECTOMY  06/13/2001   COLONOSCOPY WITH PROPOFOL  N/A 09/24/2021   Procedure: COLONOSCOPY WITH PROPOFOL ;  Surgeon: Selena Daily, MD;  Location: [facility];  Service: Gastroenterology;  Laterality: N/A;   Patient Active Problem List   Diagnosis Date Noted   Adjustment reaction with anxiety and depression 03/01/2023   Osteoarthritis 02/17/2022   Encounter for annual general medical examination with abnormal findings in adult 02/17/2022   Arthritis  of carpometacarpal [facility]) joint of right thumb 12/28/2021   Polyp of cecum    Unilateral primary osteoarthritis, right knee 09/22/2021   Screen for colon cancer 01/11/2016   Insomnia 01/01/2014   HYPERCHOLESTEROLEMIA 10/04/2006    PCP: Gabriel John, NP   REFERRING PROVIDER: Arnie Lao MD  REFERRING DIAG: Low back pain, DDD  Rationale for Evaluation and Treatment: Rehabilitation  THERAPY DIAG:  Other low back pain - Plan: PT plan of care cert/re-cert  ONSET DATE: 5-6 years ago  SUBJECTIVE:  SUBJECTIVE STATEMENT: L low back pain. 5/10 at worst for the past 3 months intermittently but has not been playing tennis.   PERTINENT HISTORY:  Low back pain.  Pt was playing tennis 6 years ago. Pt was trying to arch her back to get a better tennis serve. After a while pain worsened. Pt then went to a chiropractor but also rested. Got an x-ray which revealed scoliosis on L side. The pain would take her breath away. Sitting and resting helps. Has been dealing with her back pain ever since. Has not hurt that bad again. Pt also has R dorsal foot numbness, got an MRI for her low back.      PAIN:  Are you having pain? Yes: NPRS scale: 0/10 Pain location: L low back Pain description: sharp Aggravating factors: Lunging to the R or L while playing tennis; serving at tennis (does not do that anymore), walking about 15 minutes; standing for 30 minutes Relieving factors: sitting for 5 minutes   PRECAUTIONS: Osteopenia per bone density on 04/13/2023  RED FLAGS: Bowel or bladder incontinence: No and Cauda equina syndrome: No   WEIGHT BEARING RESTRICTIONS: No  FALLS:  Has patient fallen in last 6 months? No  LIVING ENVIRONMENT: Lives with: lives with their spouse Lives in:  House/apartment Stairs: No Has following equipment at home: None  OCCUPATION: retired  PLOF: Independent  PATIENT GOALS: Get her muscles strong enough so the degeneration does not get worse.   NEXT MD VISIT: None yet  OBJECTIVE:  Note: Objective measures were completed at Evaluation unless otherwise noted.  DIAGNOSTIC FINDINGS:  MR Lumbar Spine w/o contrast 03/31/2023  Narrative & Impression  CLINICAL DATA:  78 year old female with 4 months of low back pain. Numbness at the top of the right foot.   EXAM: MRI LUMBAR SPINE WITHOUT CONTRAST   TECHNIQUE: Multiplanar, multisequence MR imaging of the lumbar spine was performed. No intravenous contrast was administered.   COMPARISON:  None Available.   FINDINGS: Segmentation: Lumbar segmentation appears to be normal and will be designated as such for this report.   Alignment: Levoconvex lumbar scoliosis is mild-to-moderate, apex at L3-L4. Associated straightening of lumbar lordosis.   Vertebrae: Normal background bone marrow signal. Widespread lumbar degenerative endplate spurring. Mostly chronic degenerative endplate marrow signal changes associated. Faint superimposed endplate marrow edema such as at L2-L3 and L3-L4 on series 109, image 7. Intact visible sacrum and SI joints. Incidental small S2-S3 sacral Tarlov cysts (normal variant).   Conus medullaris and cauda equina: Conus extends to the L1-L2 level. No lower spinal cord or conus signal abnormality. Generally normal cauda equina nerve roots.   Paraspinal and other soft tissues: Negative.   Disc levels:   T11-T12 and   T12-L1:  Negative.   L1-L2: Disc desiccation and disc space loss. Disc bulging asymmetric to the left. No spinal or convincing lateral recess stenosis. Mild mostly left side L1 foraminal stenosis.   L2-L3: Severe disc space loss. Circumferential disc osteophyte complex. Mild facet and ligament flavum hypertrophy greater on the right. No  spinal or convincing lateral recess stenosis. Mild more so than moderate bilateral L2 neural foraminal stenosis.   L3-L4: Similar disc space loss. Circumferential disc bulging and endplate spurring with asymmetric left foraminal component of disc on series 7, image 13. Mild facet and ligament flavum hypertrophy. No spinal or lateral recess stenosis. Moderate left and mild to moderate right L3 neural foraminal stenosis.   L4-L5: Severe disc space loss. Circumferential disc osteophyte complex. Mild to  moderate facet and ligament flavum hypertrophy greater on the left. No significant spinal stenosis. Mild left lateral recess stenosis (descending left L5 nerve level series 115, image 27). Moderate left and moderate to severe right (series 7, image 4) L4 neural foraminal stenosis.   L5-S1: Better preserved disc space. Circumferential disc bulging is asymmetric to the left. Moderate facet and ligament flavum hypertrophy greater on the left. Degenerative left facet joint fluid. No spinal stenosis, but moderate left lateral recess stenosis (descending left L5 nerve level series 115, image 32). Severe left L5 neural foraminal stenosis (series 7, image 11). Only borderline to mild right foraminal stenosis.   IMPRESSION: 1. Advanced lumbar spine disc and endplate degeneration in the setting of mild to moderate levoconvex scoliosis. Faint degenerative mid lumbar endplate marrow edema. 2. No significant lumbar spinal stenosis due to capacious underlying spinal canal. Right side neural impingement maximal at the right L4 neural foramina, multifactorial. Multilevel moderate and occasionally severe (left L5 nerve level) left side lateral recess and foraminal stenosis.     Electronically Signed   By: Marlise Simpers M.D.   On: 04/17/2023 08:34     PATIENT SURVEYS:  Modified Oswestry 13/50 (26%)   COGNITION: Overall cognitive status: Within functional limits for tasks  assessed     SENSATION: WFL  MUSCLE LENGTH:   POSTURE: forward neck, movement preference around C5/C6 area, and C3/C4 area, B protracted shoulders, R shoulder higher, thoracic kyphosis, R lateral shift   R  convexity at thoracolumbar junction L lumbar convexity around L2,3,4 area L lumbar rotation  B genu valgus R > L   Improved low back comfort level with manual posturral correction from PT  Prone position: L posterior pelvic rotation    PALPATION: TTP L SI joint area.  L ASIS more posterior compared to R   LUMBAR ROM:   AROM eval  Flexion WFL  Extension WFL with R lumbar rotation  Right lateral flexion WFL  Left lateral flexion WFL  Right rotation ful  Left rotation ful     Lumbar extension with R rotation WFL  Lumbar extension with L rotation  WFL with slight symptoms   (Blank rows = not tested)   LOWER EXTREMITY ROM:     Passive  Right eval Left eval  Hip flexion    Hip extension    Hip abduction    Hip adduction    Hip internal rotation 22 degrees  18 degrees  Hip external rotation  [facility]  Knee flexion    Knee extension    Ankle dorsiflexion    Ankle plantarflexion    Ankle inversion    Ankle eversion     (Blank rows = not tested)  LOWER EXTREMITY MMT:    MMT Right eval Left eval  Hip flexion 4 4  Hip extension 4 with lumbar extension compensation 4 with lumbar extension compensation  Hip abduction 4 4  Hip adduction    Hip internal rotation    Hip external rotation    Knee flexion 5 5  Knee extension 5 5  Ankle dorsiflexion    Ankle plantarflexion    Ankle inversion    Ankle eversion     (Blank rows = not tested)  LUMBAR SPECIAL TESTS:  (-) repeated flexion test Long sit test suggests posterior nutation of L innominate.    FUNCTIONAL TESTS:    GAIT: Distance walked: 60 ft Assistive device utilized: None Level of assistance: Complete Independence Comments: decreased stance L LE, B pelvic drop  TREATMENT DATE: 05/17/2023                                                                                                                                Therapeutic exercise  Supine R  piriformis stretch 30 seconds x 3  Seated hip IR   R 10x2 with 5 second holds  Reviewed HEP. Pt demonstrated and verbalized understanding. Handout provided.   Improved exercise technique, movement at target joints, use of target muscles after mod verbal, visual, tactile cues.      PATIENT EDUCATION:  Education details: there-ex, HEP, POC Person educated: Patient Education method: Explanation, Demonstration, Tactile cues, Verbal cues, and Handouts Education comprehension: verbalized understanding and returned demonstration  HOME EXERCISE PROGRAM: Access Code: [email or web address] URL: [email or web address] Date: 05/17/2023 Prepared by: Suzzane Estes  Exercises - Supine Piriformis Stretch with Leg Straight  - 3 x daily - 7 x weekly - 1 sets - 5 reps - 30 seconds  hold - Seated Hip Internal Rotation AROM  - 1 x daily - 7 x weekly - 3 sets - 10 reps - 5 seconds hold  ASSESSMENT:  CLINICAL IMPRESSION: Patient is a 78 y.o. female who was seen today for physical therapy evaluation and treatment for chronic low back pain. She also presents with altered gait pattern and posture trunk and B glute weakness, positive special test suggesting posterior nutation of L innominate, reproduction of low back symptoms with lumbar extension and L rotation, TTP to L SI joint area, and difficulty performing task which involve prolonged standing and walking as well as difficulty playing tennis secondary to low back pain. Pt will benefit from skilled physical therapy services to address the aforementioned deficits.    OBJECTIVE IMPAIRMENTS: difficulty walking, decreased ROM, decreased strength, improper body mechanics, postural dysfunction, and pain.   ACTIVITY LIMITATIONS: standing and locomotion level  PARTICIPATION LIMITATIONS:   PERSONAL  FACTORS: Age, Fitness, Time since onset of injury/illness/exacerbation, and 1-2 comorbidities: Osteopenia, arthritis, skin CA  are also affecting patient's functional outcome.   REHAB POTENTIAL: Fair    CLINICAL DECISION MAKING: Stable/uncomplicated  EVALUATION COMPLEXITY: Low   GOALS: Goals reviewed with patient? Yes  SHORT TERM GOALS: Target date: 06/02/2023  Pt will be independent with her initial HEP to improve posture, strength, function, and ability to perform standing tasks and ambulate longer distances more comfortably for her back.  Baseline: Pt has started her initial HEP. (05/17/2023) Goal status: INITIAL      LONG TERM GOALS: Target date: 07/14/2023  Pt will have a decrease in low back pain to 2/10 or less at worst to promote ability to ambulate longer distances, perform standing tasks, and play tennis more comfortably for her back.  Baseline: L low back pain. 5/10 at worst for the past 3 months  (05/17/2023) Goal status: INITIAL  2.  Pt will improve B hip extension and abduction strength by at least 1/2 MMT grade to promote ability to perform standing  tasks as well as ambulate with less low back pain.  Baseline:  MMT Right eval Left eval  Hip extension 4 with lumbar extension compensation 4 with lumbar extension compensation  Hip abduction 4 4   (05/17/2023)   Goal status: INITIAL  3.  Pt will improve B hip IR ROM by at least 10 degrees to promote ability to ambulate longer distances more comfortably for her back.  Baseline:  Passive  Right eval Left eval  Hip internal rotation 22 degrees  18 degrees   (05/17/2023)  Goal status: INITIAL  4.  Pt will improve her Modified Oswestry Low Back Pain disability Questionnaire by at least 10% as a demonstration of improved function.  Baseline: 13/50 (26%) (05/17/2023) Goal status: INITIAL   PLAN:  PT FREQUENCY: 1-2x/week  PT DURATION: 8 weeks  PLANNED INTERVENTIONS: 97110-Therapeutic exercises, 97530-  Therapeutic activity, [id number]- Neuromuscular re-education, 97535- Self Care, 16109- Manual therapy, [id number]- Electrical stimulation (unattended), [id number]- Ionotophoresis 4mg /ml Dexamethasone, Patient/Family education, and Dry Needling.  PLAN FOR NEXT SESSION: Posture, trunk and hip strengthening, hip ROM, manual techniques, modalities PRN   Zyshonne Malecha, PT, DPT 05/17/2023, 11:54 AM

## 2023-05-23 ENCOUNTER — Ambulatory Visit

## 2023-05-23 DIAGNOSIS — M25612 Stiffness of left shoulder, not elsewhere classified: Secondary | ICD-10-CM | POA: Diagnosis not present

## 2023-05-23 DIAGNOSIS — M5459 Other low back pain: Secondary | ICD-10-CM | POA: Diagnosis not present

## 2023-05-23 DIAGNOSIS — M25512 Pain in left shoulder: Secondary | ICD-10-CM | POA: Diagnosis not present

## 2023-05-23 DIAGNOSIS — R293 Abnormal posture: Secondary | ICD-10-CM | POA: Diagnosis not present

## 2023-05-23 NOTE — Therapy (Signed)
 OUTPATIENT PHYSICAL THERAPY EVALUATION   Patient Name: Natalie Sosa MRN: [id number] DOB:1945-02-19, 78 y.o., female Today's Date: 05/23/2023  END OF SESSION:  PT End of Session - 05/23/23 0905     Visit Number 2    Number of Visits 17    Date for PT Re-Evaluation 07/14/23    PT Start Time 0905    PT Stop Time 0946    PT Time Calculation (min) 41 min    Activity Tolerance Patient tolerated treatment well    Behavior During Therapy [facility] for tasks assessed/performed              Past Medical History:  Diagnosis Date   Arthritis    right knee, right thumb   Bright red rectal bleeding 09/01/2021   Cancer (HCC)    skin   COVID-19 12/31/2020   ENDOMETRIOSIS 10/04/2006   Qualifier: History of  By: Roxine Cordia MD, Candie Chamber    Suprapubic pressure 09/01/2021   Past Surgical History:  Procedure Laterality Date   ACHILLES TENDON REPAIR  06/14/2004   CATARACT EXTRACTION W/PHACO Right 11/09/2022   Procedure: CATARACT EXTRACTION PHACO AND INTRAOCULAR LENS PLACEMENT (IOC) RIGHT TORIC 7.96 00:48.4;  Surgeon: Annell Kidney, MD;  Location: [facility];  Service: Ophthalmology;  Laterality: Right;   CATARACT EXTRACTION W/PHACO Left 12/07/2022   Procedure: CATARACT EXTRACTION PHACO AND INTRAOCULAR LENS PLACEMENT (IOC) LEFT  CLAREON VIVITY TORIC LENS 7.43 00:49.5;  Surgeon: Annell Kidney, MD;  Location: [facility];  Service: Ophthalmology;  Laterality: Left;   CESAREAN SECTION     placenta previa   CHOLECYSTECTOMY  06/13/2001   COLONOSCOPY WITH PROPOFOL  N/A 09/24/2021   Procedure: COLONOSCOPY WITH PROPOFOL ;  Surgeon: Selena Daily, MD;  Location: [facility];  Service: Gastroenterology;  Laterality: N/A;   Patient Active Problem List   Diagnosis Date Noted   Adjustment reaction with anxiety and depression 03/01/2023   Osteoarthritis 02/17/2022   Encounter for annual general medical examination with abnormal findings in adult 02/17/2022   Arthritis  of carpometacarpal [facility]) joint of right thumb 12/28/2021   Polyp of cecum    Unilateral primary osteoarthritis, right knee 09/22/2021   Screen for colon cancer 01/11/2016   Insomnia 01/01/2014   HYPERCHOLESTEROLEMIA 10/04/2006    PCP: Gabriel John, NP   REFERRING PROVIDER: Arnie Lao MD  REFERRING DIAG: Low back pain, DDD  Rationale for Evaluation and Treatment: Rehabilitation  THERAPY DIAG:  Other low back pain  Left shoulder pain, unspecified chronicity  Stiffness of left shoulder, not elsewhere classified  ONSET DATE: 5-6 years ago  SUBJECTIVE:  SUBJECTIVE STATEMENT: Played tennis yesterday. Low back started hurting after playing for an hour. By the time she started driving back home, back was ok. No L low back pain currently. When something bothers her back, when she sits down and rests it feels better.     PERTINENT HISTORY:  Low back pain.  Pt was playing tennis 6 years ago. Pt was trying to arch her back to get a better tennis serve. After a while pain worsened. Pt then went to a chiropractor but also rested. Got an x-ray which revealed scoliosis on L side. The pain would take her breath away. Sitting and resting helps. Has been dealing with her back pain ever since. Has not hurt that bad again. Pt also has R dorsal foot numbness, got an MRI for her low back.    No latex allergies   PAIN:  Are you having pain? Yes: NPRS scale: 0/10 Pain location: L low back Pain description: sharp Aggravating factors: Lunging to the R or L while playing tennis; serving at tennis (does not do that anymore), walking about 15 minutes; standing for 30 minutes Relieving factors: sitting for 5 minutes   PRECAUTIONS: Osteopenia per bone density on 04/13/2023  RED FLAGS: Bowel or  bladder incontinence: No and Cauda equina syndrome: No   WEIGHT BEARING RESTRICTIONS: No  FALLS:  Has patient fallen in last 6 months? No  LIVING ENVIRONMENT: Lives with: lives with their spouse Lives in: House/apartment Stairs: No Has following equipment at home: None  OCCUPATION: retired  PLOF: Independent  PATIENT GOALS: Get her muscles strong enough so the degeneration does not get worse.   NEXT MD VISIT: None yet  OBJECTIVE:  Note: Objective measures were completed at Evaluation unless otherwise noted.  DIAGNOSTIC FINDINGS:  MR Lumbar Spine w/o contrast 03/31/2023  Narrative & Impression  CLINICAL DATA:  78 year old female with 4 months of low back pain. Numbness at the top of the right foot.   EXAM: MRI LUMBAR SPINE WITHOUT CONTRAST   TECHNIQUE: Multiplanar, multisequence MR imaging of the lumbar spine was performed. No intravenous contrast was administered.   COMPARISON:  None Available.   FINDINGS: Segmentation: Lumbar segmentation appears to be normal and will be designated as such for this report.   Alignment: Levoconvex lumbar scoliosis is mild-to-moderate, apex at L3-L4. Associated straightening of lumbar lordosis.   Vertebrae: Normal background bone marrow signal. Widespread lumbar degenerative endplate spurring. Mostly chronic degenerative endplate marrow signal changes associated. Faint superimposed endplate marrow edema such as at L2-L3 and L3-L4 on series 109, image 7. Intact visible sacrum and SI joints. Incidental small S2-S3 sacral Tarlov cysts (normal variant).   Conus medullaris and cauda equina: Conus extends to the L1-L2 level. No lower spinal cord or conus signal abnormality. Generally normal cauda equina nerve roots.   Paraspinal and other soft tissues: Negative.   Disc levels:   T11-T12 and   T12-L1:  Negative.   L1-L2: Disc desiccation and disc space loss. Disc bulging asymmetric to the left. No spinal or convincing lateral  recess stenosis. Mild mostly left side L1 foraminal stenosis.   L2-L3: Severe disc space loss. Circumferential disc osteophyte complex. Mild facet and ligament flavum hypertrophy greater on the right. No spinal or convincing lateral recess stenosis. Mild more so than moderate bilateral L2 neural foraminal stenosis.   L3-L4: Similar disc space loss. Circumferential disc bulging and endplate spurring with asymmetric left foraminal component of disc on series 7, image 13. Mild facet and ligament flavum hypertrophy. No  spinal or lateral recess stenosis. Moderate left and mild to moderate right L3 neural foraminal stenosis.   L4-L5: Severe disc space loss. Circumferential disc osteophyte complex. Mild to moderate facet and ligament flavum hypertrophy greater on the left. No significant spinal stenosis. Mild left lateral recess stenosis (descending left L5 nerve level series 115, image 27). Moderate left and moderate to severe right (series 7, image 4) L4 neural foraminal stenosis.   L5-S1: Better preserved disc space. Circumferential disc bulging is asymmetric to the left. Moderate facet and ligament flavum hypertrophy greater on the left. Degenerative left facet joint fluid. No spinal stenosis, but moderate left lateral recess stenosis (descending left L5 nerve level series 115, image 32). Severe left L5 neural foraminal stenosis (series 7, image 11). Only borderline to mild right foraminal stenosis.   IMPRESSION: 1. Advanced lumbar spine disc and endplate degeneration in the setting of mild to moderate levoconvex scoliosis. Faint degenerative mid lumbar endplate marrow edema. 2. No significant lumbar spinal stenosis due to capacious underlying spinal canal. Right side neural impingement maximal at the right L4 neural foramina, multifactorial. Multilevel moderate and occasionally severe (left L5 nerve level) left side lateral recess and foraminal stenosis.     Electronically  Signed   By: Marlise Simpers M.D.   On: 04/17/2023 08:34     PATIENT SURVEYS:  Modified Oswestry 13/50 (26%)   COGNITION: Overall cognitive status: Within functional limits for tasks assessed     SENSATION: WFL  MUSCLE LENGTH:   POSTURE: forward neck, movement preference around C5/C6 area, and C3/C4 area, B protracted shoulders, R shoulder higher, thoracic kyphosis, R lateral shift   R  convexity at thoracolumbar junction L lumbar convexity around L2,3,4 area L lumbar rotation  B genu valgus R > L   Improved low back comfort level with manual posturral correction from PT  Prone position: L posterior pelvic rotation    PALPATION: TTP L SI joint area.  L ASIS more posterior compared to R   LUMBAR ROM:   AROM eval  Flexion WFL  Extension WFL with R lumbar rotation  Right lateral flexion WFL  Left lateral flexion WFL  Right rotation ful  Left rotation ful     Lumbar extension with R rotation WFL  Lumbar extension with L rotation  WFL with slight symptoms   (Blank rows = not tested)   LOWER EXTREMITY ROM:     Passive  Right eval Left eval  Hip flexion    Hip extension    Hip abduction    Hip adduction    Hip internal rotation 22 degrees  18 degrees  Hip external rotation  [facility]  Knee flexion    Knee extension    Ankle dorsiflexion    Ankle plantarflexion    Ankle inversion    Ankle eversion     (Blank rows = not tested)  LOWER EXTREMITY MMT:    MMT Right eval Left eval  Hip flexion 4 4  Hip extension 4 with lumbar extension compensation 4 with lumbar extension compensation  Hip abduction 4 4  Hip adduction    Hip internal rotation    Hip external rotation    Knee flexion 5 5  Knee extension 5 5  Ankle dorsiflexion    Ankle plantarflexion    Ankle inversion    Ankle eversion     (Blank rows = not tested)  LUMBAR SPECIAL TESTS:  (-) repeated flexion test Long sit test suggests posterior nutation of L innominate.  FUNCTIONAL TESTS:     GAIT: Distance walked: 60 ft Assistive device utilized: None Level of assistance: Complete Independence Comments: decreased stance L LE, B pelvic drop  TREATMENT DATE: 05/23/2023                                                                                                                               Therapeutic exercise  Standing R lateral shift correction 10x5 seconds for 3 sets   Seated thoracic extension at chair 10x5 seconds to decrease stress to low back.   Seated R  piriformis stretch 30 seconds x 4  Seated hip IR   R 10x2 with 5 second holds  Single leg dead lift with contralateral UE assist, pain free range for knees  R 10x2  L 10x2  SLS with contralateral UE assist with emphasis on level pelvis to promote glute med strengthening functionally  R 10x5 seconds   L 10x5 seconds   Standign with B UE assist   Hip abduction with yellow band around ankles   R 10x2   L 10x2     Improved exercise technique, movement at target joints, use of target muscles after mod verbal, visual, tactile cues.      PATIENT EDUCATION:  Education details: there-ex, HEP, POC Person educated: Patient Education method: Explanation, Demonstration, Tactile cues, Verbal cues, and Handouts Education comprehension: verbalized understanding and returned demonstration  HOME EXERCISE PROGRAM: Access Code: [email or web address] URL: [email or web address] Date: 05/17/2023 Prepared by: Suzzane Estes  Exercises - Supine Piriformis Stretch with Leg Straight  - 3 x daily - 7 x weekly - 1 sets - 5 reps - 30 seconds  hold - Seated Hip Internal Rotation AROM  - 1 x daily - 7 x weekly - 3 sets - 10 reps - 5 seconds hold - Right Standing Lateral Shift Correction at Wall - Repetitions  - 1 x daily - 7 x weekly - 3 sets - 10 reps - 5 seconds hold  - Standing Hip Abduction with Resistance at Ankles and Counter Support  - 1 x daily - 7 x weekly - 2 sets - 10 reps  Yellow  band  ASSESSMENT:  CLINICAL IMPRESSION:  Worked on improving posture, thoracic extension, and glute med and max strengthening to decrerase extension stress to low back. Pt tolerated session well without aggravation of symptoms. Good muscle use reported with exercises. Pt will benefit from continued skilled physical therapy services to decrease pain, improve strength and function.     OBJECTIVE IMPAIRMENTS: difficulty walking, decreased ROM, decreased strength, improper body mechanics, postural dysfunction, and pain.   ACTIVITY LIMITATIONS: standing and locomotion level  PARTICIPATION LIMITATIONS:   PERSONAL FACTORS: Age, Fitness, Time since onset of injury/illness/exacerbation, and 1-2 comorbidities: Osteopenia, arthritis, skin CA  are also affecting patient's functional outcome.   REHAB POTENTIAL: Fair    CLINICAL DECISION MAKING: Stable/uncomplicated  EVALUATION COMPLEXITY: Low   GOALS: Goals reviewed with patient? Yes  SHORT TERM GOALS:  Target date: 06/02/2023  Pt will be independent with her initial HEP to improve posture, strength, function, and ability to perform standing tasks and ambulate longer distances more comfortably for her back.  Baseline: Pt has started her initial HEP. (05/17/2023) Goal status: INITIAL      LONG TERM GOALS: Target date: 07/14/2023  Pt will have a decrease in low back pain to 2/10 or less at worst to promote ability to ambulate longer distances, perform standing tasks, and play tennis more comfortably for her back.  Baseline: L low back pain. 5/10 at worst for the past 3 months  (05/17/2023) Goal status: INITIAL  2.  Pt will improve B hip extension and abduction strength by at least 1/2 MMT grade to promote ability to perform standing tasks as well as ambulate with less low back pain.  Baseline:  MMT Right eval Left eval  Hip extension 4 with lumbar extension compensation 4 with lumbar extension compensation  Hip abduction 4 4    (05/17/2023)   Goal status: INITIAL  3.  Pt will improve B hip IR ROM by at least 10 degrees to promote ability to ambulate longer distances more comfortably for her back.  Baseline:  Passive  Right eval Left eval  Hip internal rotation 22 degrees  18 degrees   (05/17/2023)  Goal status: INITIAL  4.  Pt will improve her Modified Oswestry Low Back Pain disability Questionnaire by at least 10% as a demonstration of improved function.  Baseline: 13/50 (26%) (05/17/2023) Goal status: INITIAL   PLAN:  PT FREQUENCY: 1-2x/week  PT DURATION: 8 weeks  PLANNED INTERVENTIONS: 97110-Therapeutic exercises, 97530- Therapeutic activity, 97112- Neuromuscular re-education, 97535- Self Care, 24401- Manual therapy, [id number]- Electrical stimulation (unattended), [phone]- Ionotophoresis 4mg /ml Dexamethasone, Patient/Family education, and Dry Needling.  PLAN FOR NEXT SESSION: Posture, trunk and hip strengthening, hip ROM, manual techniques, modalities PRN   Katarzyna Wolven, PT, DPT 05/23/2023, 11:46 AM

## 2023-05-25 ENCOUNTER — Ambulatory Visit: Attending: Orthopaedic Surgery

## 2023-05-25 DIAGNOSIS — M5459 Other low back pain: Secondary | ICD-10-CM | POA: Diagnosis not present

## 2023-05-25 DIAGNOSIS — R293 Abnormal posture: Secondary | ICD-10-CM | POA: Insufficient documentation

## 2023-05-25 DIAGNOSIS — M25512 Pain in left shoulder: Secondary | ICD-10-CM | POA: Diagnosis not present

## 2023-05-25 DIAGNOSIS — M25612 Stiffness of left shoulder, not elsewhere classified: Secondary | ICD-10-CM | POA: Diagnosis not present

## 2023-05-25 NOTE — Therapy (Signed)
 OUTPATIENT PHYSICAL THERAPY EVALUATION   Patient Name: Natalie Sosa MRN: [id number] DOB:08/20/1945, 78 y.o., female Today's Date: 05/25/2023  END OF SESSION:  PT End of Session - 05/25/23 0905     Visit Number 3    Number of Visits 17    Date for PT Re-Evaluation 07/14/23    PT Start Time 0905    PT Stop Time 0944    PT Time Calculation (min) 39 min    Activity Tolerance Patient tolerated treatment well    Behavior During Therapy [facility] for tasks assessed/performed               Past Medical History:  Diagnosis Date   Arthritis    right knee, right thumb   Bright red rectal bleeding 09/01/2021   Cancer (HCC)    skin   COVID-19 12/31/2020   ENDOMETRIOSIS 10/04/2006   Qualifier: History of  By: Roxine Cordia MD, Candie Chamber    Suprapubic pressure 09/01/2021   Past Surgical History:  Procedure Laterality Date   ACHILLES TENDON REPAIR  06/14/2004   CATARACT EXTRACTION W/PHACO Right 11/09/2022   Procedure: CATARACT EXTRACTION PHACO AND INTRAOCULAR LENS PLACEMENT (IOC) RIGHT TORIC 7.96 00:48.4;  Surgeon: Annell Kidney, MD;  Location: [facility];  Service: Ophthalmology;  Laterality: Right;   CATARACT EXTRACTION W/PHACO Left 12/07/2022   Procedure: CATARACT EXTRACTION PHACO AND INTRAOCULAR LENS PLACEMENT (IOC) LEFT  CLAREON VIVITY TORIC LENS 7.43 00:49.5;  Surgeon: Annell Kidney, MD;  Location: [facility];  Service: Ophthalmology;  Laterality: Left;   CESAREAN SECTION     placenta previa   CHOLECYSTECTOMY  06/13/2001   COLONOSCOPY WITH PROPOFOL  N/A 09/24/2021   Procedure: COLONOSCOPY WITH PROPOFOL ;  Surgeon: Selena Daily, MD;  Location: [facility];  Service: Gastroenterology;  Laterality: N/A;   Patient Active Problem List   Diagnosis Date Noted   Adjustment reaction with anxiety and depression 03/01/2023   Osteoarthritis 02/17/2022   Encounter for annual general medical examination with abnormal findings in adult 02/17/2022    Arthritis of carpometacarpal [facility]) joint of right thumb 12/28/2021   Polyp of cecum    Unilateral primary osteoarthritis, right knee 09/22/2021   Screen for colon cancer 01/11/2016   Insomnia 01/01/2014   HYPERCHOLESTEROLEMIA 10/04/2006    PCP: Gabriel John, NP   REFERRING PROVIDER: Arnie Lao MD  REFERRING DIAG: Low back pain, DDD  Rationale for Evaluation and Treatment: Rehabilitation  THERAPY DIAG:  Other low back pain  ONSET DATE: 5-6 years ago  SUBJECTIVE:  SUBJECTIVE STATEMENT: Low back is ok, no pain currently. The back was ok after last session. Felt B glute max sore muscles the day after, fine now.      PERTINENT HISTORY:  Low back pain.  Pt was playing tennis 6 years ago. Pt was trying to arch her back to get a better tennis serve. After a while pain worsened. Pt then went to a chiropractor but also rested. Got an x-ray which revealed scoliosis on L side. The pain would take her breath away. Sitting and resting helps. Has been dealing with her back pain ever since. Has not hurt that bad again. Pt also has R dorsal foot numbness, got an MRI for her low back.    No latex allergies   PAIN:  Are you having pain? Yes: NPRS scale: 0/10 Pain location: L low back Pain description: sharp Aggravating factors: Lunging to the R or L while playing tennis; serving at tennis (does not do that anymore), walking about 15 minutes; standing for 30 minutes Relieving factors: sitting for 5 minutes   PRECAUTIONS: Osteopenia per bone density on 04/13/2023  RED FLAGS: Bowel or bladder incontinence: No and Cauda equina syndrome: No   WEIGHT BEARING RESTRICTIONS: No  FALLS:  Has patient fallen in last 6 months? No  LIVING ENVIRONMENT: Lives with: lives with their spouse Lives  in: House/apartment Stairs: No Has following equipment at home: None  OCCUPATION: retired  PLOF: Independent  PATIENT GOALS: Get her muscles strong enough so the degeneration does not get worse.   NEXT MD VISIT: None yet  OBJECTIVE:  Note: Objective measures were completed at Evaluation unless otherwise noted.  DIAGNOSTIC FINDINGS:  MR Lumbar Spine w/o contrast 03/31/2023  Narrative & Impression  CLINICAL DATA:  78 year old female with 4 months of low back pain. Numbness at the top of the right foot.   EXAM: MRI LUMBAR SPINE WITHOUT CONTRAST   TECHNIQUE: Multiplanar, multisequence MR imaging of the lumbar spine was performed. No intravenous contrast was administered.   COMPARISON:  None Available.   FINDINGS: Segmentation: Lumbar segmentation appears to be normal and will be designated as such for this report.   Alignment: Levoconvex lumbar scoliosis is mild-to-moderate, apex at L3-L4. Associated straightening of lumbar lordosis.   Vertebrae: Normal background bone marrow signal. Widespread lumbar degenerative endplate spurring. Mostly chronic degenerative endplate marrow signal changes associated. Faint superimposed endplate marrow edema such as at L2-L3 and L3-L4 on series 109, image 7. Intact visible sacrum and SI joints. Incidental small S2-S3 sacral Tarlov cysts (normal variant).   Conus medullaris and cauda equina: Conus extends to the L1-L2 level. No lower spinal cord or conus signal abnormality. Generally normal cauda equina nerve roots.   Paraspinal and other soft tissues: Negative.   Disc levels:   T11-T12 and   T12-L1:  Negative.   L1-L2: Disc desiccation and disc space loss. Disc bulging asymmetric to the left. No spinal or convincing lateral recess stenosis. Mild mostly left side L1 foraminal stenosis.   L2-L3: Severe disc space loss. Circumferential disc osteophyte complex. Mild facet and ligament flavum hypertrophy greater on the right. No  spinal or convincing lateral recess stenosis. Mild more so than moderate bilateral L2 neural foraminal stenosis.   L3-L4: Similar disc space loss. Circumferential disc bulging and endplate spurring with asymmetric left foraminal component of disc on series 7, image 13. Mild facet and ligament flavum hypertrophy. No spinal or lateral recess stenosis. Moderate left and mild to moderate right L3 neural foraminal stenosis.  L4-L5: Severe disc space loss. Circumferential disc osteophyte complex. Mild to moderate facet and ligament flavum hypertrophy greater on the left. No significant spinal stenosis. Mild left lateral recess stenosis (descending left L5 nerve level series 115, image 27). Moderate left and moderate to severe right (series 7, image 4) L4 neural foraminal stenosis.   L5-S1: Better preserved disc space. Circumferential disc bulging is asymmetric to the left. Moderate facet and ligament flavum hypertrophy greater on the left. Degenerative left facet joint fluid. No spinal stenosis, but moderate left lateral recess stenosis (descending left L5 nerve level series 115, image 32). Severe left L5 neural foraminal stenosis (series 7, image 11). Only borderline to mild right foraminal stenosis.   IMPRESSION: 1. Advanced lumbar spine disc and endplate degeneration in the setting of mild to moderate levoconvex scoliosis. Faint degenerative mid lumbar endplate marrow edema. 2. No significant lumbar spinal stenosis due to capacious underlying spinal canal. Right side neural impingement maximal at the right L4 neural foramina, multifactorial. Multilevel moderate and occasionally severe (left L5 nerve level) left side lateral recess and foraminal stenosis.     Electronically Signed   By: Marlise Simpers M.D.   On: 04/17/2023 08:34     PATIENT SURVEYS:  Modified Oswestry 13/50 (26%)   COGNITION: Overall cognitive status: Within functional limits for tasks  assessed     SENSATION: WFL  MUSCLE LENGTH:   POSTURE: forward neck, movement preference around C5/C6 area, and C3/C4 area, B protracted shoulders, R shoulder higher, thoracic kyphosis, R lateral shift   R  convexity at thoracolumbar junction L lumbar convexity around L2,3,4 area L lumbar rotation  B genu valgus R > L   Improved low back comfort level with manual posturral correction from PT  Prone position: L posterior pelvic rotation    PALPATION: TTP L SI joint area.  L ASIS more posterior compared to R   LUMBAR ROM:   AROM eval  Flexion WFL  Extension WFL with R lumbar rotation  Right lateral flexion WFL  Left lateral flexion WFL  Right rotation ful  Left rotation ful     Lumbar extension with R rotation WFL  Lumbar extension with L rotation  WFL with slight symptoms   (Blank rows = not tested)   LOWER EXTREMITY ROM:     Passive  Right eval Left eval  Hip flexion    Hip extension    Hip abduction    Hip adduction    Hip internal rotation 22 degrees  18 degrees  Hip external rotation  [facility]  Knee flexion    Knee extension    Ankle dorsiflexion    Ankle plantarflexion    Ankle inversion    Ankle eversion     (Blank rows = not tested)  LOWER EXTREMITY MMT:    MMT Right eval Left eval  Hip flexion 4 4  Hip extension 4 with lumbar extension compensation 4 with lumbar extension compensation  Hip abduction 4 4  Hip adduction    Hip internal rotation    Hip external rotation    Knee flexion 5 5  Knee extension 5 5  Ankle dorsiflexion    Ankle plantarflexion    Ankle inversion    Ankle eversion     (Blank rows = not tested)  LUMBAR SPECIAL TESTS:  (-) repeated flexion test Long sit test suggests posterior nutation of L innominate.    FUNCTIONAL TESTS:    GAIT: Distance walked: 60 ft Assistive device utilized: None Level of assistance:  Complete Independence Comments: decreased stance L LE, B pelvic drop  TREATMENT DATE: 51/2025                                                                                                                                Therapeutic exercise  Seated R  piriformis stretch 30 seconds x 4  Seated hip IR   R 10x with 3 lbs ankle weight   Then 10x5 seconds  Seated hip adduction isometrics, folded pillow squeeze 10x5 seconds   Seated thoracic extension at chair 10x5 seconds to decrease stress to low back.   Quadruped   Hip extension    R 10x2   L 10x2  Single leg dead lift with contralateral UE assist, pain free range for knees  R 10x2  L 10x2  Hooklying   posterior pelvic tilt 10x10 seconds    Then with march 10x3 each LE   Crunch 10x3   Improved exercise technique, movement at target joints, use of target muscles after mod verbal, visual, tactile cues.      PATIENT EDUCATION:  Education details: there-ex, HEP, POC Person educated: Patient Education method: Explanation, Demonstration, Tactile cues, Verbal cues, and Handouts Education comprehension: verbalized understanding and returned demonstration  HOME EXERCISE PROGRAM: Access Code: [email or web address] URL: [email or web address] Date: 05/17/2023 Prepared by: Suzzane Estes  Exercises - Supine Piriformis Stretch with Leg Straight  - 3 x daily - 7 x weekly - 1 sets - 5 reps - 30 seconds  hold - Seated Hip Internal Rotation AROM  - 1 x daily - 7 x weekly - 3 sets - 10 reps - 5 seconds hold - Right Standing Lateral Shift Correction at Wall - Repetitions  - 1 x daily - 7 x weekly - 3 sets - 10 reps - 5 seconds hold  - Standing Hip Abduction with Resistance at Ankles and Counter Support  - 1 x daily - 7 x weekly - 2 sets - 10 reps  Yellow band  - Supine March with Posterior Pelvic Tilt  - 1 x daily - 7 x weekly - 3 sets - 10 reps   ASSESSMENT:  CLINICAL IMPRESSION:  Continued working on improving thoracic extension, trunk and glute max strengthening to decrerase extension stress to low back. Pt tolerated  session well without aggravation of symptoms.  Pt will benefit from continued skilled physical therapy services to decrease pain, improve strength and function.     OBJECTIVE IMPAIRMENTS: difficulty walking, decreased ROM, decreased strength, improper body mechanics, postural dysfunction, and pain.   ACTIVITY LIMITATIONS: standing and locomotion level  PARTICIPATION LIMITATIONS:   PERSONAL FACTORS: Age, Fitness, Time since onset of injury/illness/exacerbation, and 1-2 comorbidities: Osteopenia, arthritis, skin CA  are also affecting patient's functional outcome.   REHAB POTENTIAL: Fair    CLINICAL DECISION MAKING: Stable/uncomplicated  EVALUATION COMPLEXITY: Low   GOALS: Goals reviewed with patient? Yes  SHORT TERM GOALS: Target date: 06/02/2023  Pt will be independent with  her initial HEP to improve posture, strength, function, and ability to perform standing tasks and ambulate longer distances more comfortably for her back.  Baseline: Pt has started her initial HEP. (05/17/2023) Goal status: INITIAL      LONG TERM GOALS: Target date: 07/14/2023  Pt will have a decrease in low back pain to 2/10 or less at worst to promote ability to ambulate longer distances, perform standing tasks, and play tennis more comfortably for her back.  Baseline: L low back pain. 5/10 at worst for the past 3 months  (05/17/2023) Goal status: INITIAL  2.  Pt will improve B hip extension and abduction strength by at least 1/2 MMT grade to promote ability to perform standing tasks as well as ambulate with less low back pain.  Baseline:  MMT Right eval Left eval  Hip extension 4 with lumbar extension compensation 4 with lumbar extension compensation  Hip abduction 4 4   (05/17/2023)   Goal status: INITIAL  3.  Pt will improve B hip IR ROM by at least 10 degrees to promote ability to ambulate longer distances more comfortably for her back.  Baseline:  Passive  Right eval Left eval  Hip internal  rotation 22 degrees  18 degrees   (05/17/2023)  Goal status: INITIAL  4.  Pt will improve her Modified Oswestry Low Back Pain disability Questionnaire by at least 10% as a demonstration of improved function.  Baseline: 13/50 (26%) (05/17/2023) Goal status: INITIAL   PLAN:  PT FREQUENCY: 1-2x/week  PT DURATION: 8 weeks  PLANNED INTERVENTIONS: 97110-Therapeutic exercises, 97530- Therapeutic activity, [id number]- Neuromuscular re-education, 97535- Self Care, [id number]- Manual therapy, [id number]- Electrical stimulation (unattended), [phone]- Ionotophoresis 4mg /ml Dexamethasone, Patient/Family education, and Dry Needling.  PLAN FOR NEXT SESSION: Posture, trunk and hip strengthening, hip ROM, manual techniques, modalities PRN   Iley Deignan, PT, DPT 05/25/2023, 12:51 PM

## 2023-05-29 ENCOUNTER — Ambulatory Visit

## 2023-05-29 DIAGNOSIS — M5459 Other low back pain: Secondary | ICD-10-CM | POA: Diagnosis not present

## 2023-05-29 DIAGNOSIS — M25512 Pain in left shoulder: Secondary | ICD-10-CM | POA: Diagnosis not present

## 2023-05-29 DIAGNOSIS — R293 Abnormal posture: Secondary | ICD-10-CM | POA: Diagnosis not present

## 2023-05-29 DIAGNOSIS — M25612 Stiffness of left shoulder, not elsewhere classified: Secondary | ICD-10-CM | POA: Diagnosis not present

## 2023-05-29 NOTE — Therapy (Signed)
 OUTPATIENT PHYSICAL THERAPY TREATMENT   Patient Name: Natalie Sosa MRN: [id number] DOB:1945/08/10, 78 y.o., female Today's Date: 05/29/2023  END OF SESSION:  PT End of Session - 05/29/23 0940     Visit Number 4    Number of Visits 17    Date for PT Re-Evaluation 07/14/23    PT Start Time 0942    PT Stop Time 1025    PT Time Calculation (min) 43 min    Activity Tolerance Patient tolerated treatment well    Behavior During Therapy [facility] for tasks assessed/performed                Past Medical History:  Diagnosis Date   Arthritis    right knee, right thumb   Bright red rectal bleeding 09/01/2021   Cancer (HCC)    skin   COVID-19 12/31/2020   ENDOMETRIOSIS 10/04/2006   Qualifier: History of  By: Roxine Cordia MD, Candie Chamber    Suprapubic pressure 09/01/2021   Past Surgical History:  Procedure Laterality Date   ACHILLES TENDON REPAIR  06/14/2004   CATARACT EXTRACTION W/PHACO Right 11/09/2022   Procedure: CATARACT EXTRACTION PHACO AND INTRAOCULAR LENS PLACEMENT (IOC) RIGHT TORIC 7.96 00:48.4;  Surgeon: Annell Kidney, MD;  Location: [facility];  Service: Ophthalmology;  Laterality: Right;   CATARACT EXTRACTION W/PHACO Left 12/07/2022   Procedure: CATARACT EXTRACTION PHACO AND INTRAOCULAR LENS PLACEMENT (IOC) LEFT  CLAREON VIVITY TORIC LENS 7.43 00:49.5;  Surgeon: Annell Kidney, MD;  Location: [facility];  Service: Ophthalmology;  Laterality: Left;   CESAREAN SECTION     placenta previa   CHOLECYSTECTOMY  06/13/2001   COLONOSCOPY WITH PROPOFOL  N/A 09/24/2021   Procedure: COLONOSCOPY WITH PROPOFOL ;  Surgeon: Selena Daily, MD;  Location: [facility];  Service: Gastroenterology;  Laterality: N/A;   Patient Active Problem List   Diagnosis Date Noted   Adjustment reaction with anxiety and depression 03/01/2023   Osteoarthritis 02/17/2022   Encounter for annual general medical examination with abnormal findings in adult 02/17/2022    Arthritis of carpometacarpal [facility]) joint of right thumb 12/28/2021   Polyp of cecum    Unilateral primary osteoarthritis, right knee 09/22/2021   Screen for colon cancer 01/11/2016   Insomnia 01/01/2014   HYPERCHOLESTEROLEMIA 10/04/2006    PCP: Gabriel John, NP   REFERRING PROVIDER: Arnie Lao MD  REFERRING DIAG: Low back pain, DDD  Rationale for Evaluation and Treatment: Rehabilitation  THERAPY DIAG:  Other low back pain  ONSET DATE: 5-6 years ago  SUBJECTIVE:  SUBJECTIVE STATEMENT: Low back is ok, no pain currently. Had one day of bad neck problems during the weekend, might have slept the wrong way. The back seems to be ok. Going to try to play tennis tomorrow and see how that goes.      PERTINENT HISTORY:  Low back pain.  Pt was playing tennis 6 years ago. Pt was trying to arch her back to get a better tennis serve. After a while pain worsened. Pt then went to a chiropractor but also rested. Got an x-ray which revealed scoliosis on L side. The pain would take her breath away. Sitting and resting helps. Has been dealing with her back pain ever since. Has not hurt that bad again. Pt also has R dorsal foot numbness, got an MRI for her low back.    No latex allergies No blood pressure problems per pt.  Has osteopenia/osteoporosis   PAIN:  Are you having pain? Yes: NPRS scale: 0/10 Pain location: L low back Pain description: sharp Aggravating factors: Lunging to the R or L while playing tennis; serving at tennis (does not do that anymore), walking about 15 minutes; standing for 30 minutes Relieving factors: sitting for 5 minutes   PRECAUTIONS: Osteopenia per bone density on 04/13/2023  RED FLAGS: Bowel or bladder incontinence: No and Cauda equina syndrome: No   WEIGHT  BEARING RESTRICTIONS: No  FALLS:  Has patient fallen in last 6 months? No  LIVING ENVIRONMENT: Lives with: lives with their spouse Lives in: House/apartment Stairs: No Has following equipment at home: None  OCCUPATION: retired  PLOF: Independent  PATIENT GOALS: Get her muscles strong enough so the degeneration does not get worse.   NEXT MD VISIT: None yet  OBJECTIVE:  Note: Objective measures were completed at Evaluation unless otherwise noted.  DIAGNOSTIC FINDINGS:  MR Lumbar Spine w/o contrast 03/31/2023  Narrative & Impression  CLINICAL DATA:  78 year old female with 4 months of low back pain. Numbness at the top of the right foot.   EXAM: MRI LUMBAR SPINE WITHOUT CONTRAST   TECHNIQUE: Multiplanar, multisequence MR imaging of the lumbar spine was performed. No intravenous contrast was administered.   COMPARISON:  None Available.   FINDINGS: Segmentation: Lumbar segmentation appears to be normal and will be designated as such for this report.   Alignment: Levoconvex lumbar scoliosis is mild-to-moderate, apex at L3-L4. Associated straightening of lumbar lordosis.   Vertebrae: Normal background bone marrow signal. Widespread lumbar degenerative endplate spurring. Mostly chronic degenerative endplate marrow signal changes associated. Faint superimposed endplate marrow edema such as at L2-L3 and L3-L4 on series 109, image 7. Intact visible sacrum and SI joints. Incidental small S2-S3 sacral Tarlov cysts (normal variant).   Conus medullaris and cauda equina: Conus extends to the L1-L2 level. No lower spinal cord or conus signal abnormality. Generally normal cauda equina nerve roots.   Paraspinal and other soft tissues: Negative.   Disc levels:   T11-T12 and   T12-L1:  Negative.   L1-L2: Disc desiccation and disc space loss. Disc bulging asymmetric to the left. No spinal or convincing lateral recess stenosis. Mild mostly left side L1 foraminal stenosis.    L2-L3: Severe disc space loss. Circumferential disc osteophyte complex. Mild facet and ligament flavum hypertrophy greater on the right. No spinal or convincing lateral recess stenosis. Mild more so than moderate bilateral L2 neural foraminal stenosis.   L3-L4: Similar disc space loss. Circumferential disc bulging and endplate spurring with asymmetric left foraminal component of disc on series 7, image  13. Mild facet and ligament flavum hypertrophy. No spinal or lateral recess stenosis. Moderate left and mild to moderate right L3 neural foraminal stenosis.   L4-L5: Severe disc space loss. Circumferential disc osteophyte complex. Mild to moderate facet and ligament flavum hypertrophy greater on the left. No significant spinal stenosis. Mild left lateral recess stenosis (descending left L5 nerve level series 115, image 27). Moderate left and moderate to severe right (series 7, image 4) L4 neural foraminal stenosis.   L5-S1: Better preserved disc space. Circumferential disc bulging is asymmetric to the left. Moderate facet and ligament flavum hypertrophy greater on the left. Degenerative left facet joint fluid. No spinal stenosis, but moderate left lateral recess stenosis (descending left L5 nerve level series 115, image 32). Severe left L5 neural foraminal stenosis (series 7, image 11). Only borderline to mild right foraminal stenosis.   IMPRESSION: 1. Advanced lumbar spine disc and endplate degeneration in the setting of mild to moderate levoconvex scoliosis. Faint degenerative mid lumbar endplate marrow edema. 2. No significant lumbar spinal stenosis due to capacious underlying spinal canal. Right side neural impingement maximal at the right L4 neural foramina, multifactorial. Multilevel moderate and occasionally severe (left L5 nerve level) left side lateral recess and foraminal stenosis.     Electronically Signed   By: Marlise Simpers M.D.   On: 04/17/2023 08:34     PATIENT  SURVEYS:  Modified Oswestry 13/50 (26%)   COGNITION: Overall cognitive status: Within functional limits for tasks assessed     SENSATION: WFL  MUSCLE LENGTH:   POSTURE: forward neck, movement preference around C5/C6 area, and C3/C4 area, B protracted shoulders, R shoulder higher, thoracic kyphosis, R lateral shift   R  convexity at thoracolumbar junction L lumbar convexity around L2,3,4 area L lumbar rotation  B genu valgus R > L   Improved low back comfort level with manual posturral correction from PT  Prone position: L posterior pelvic rotation    PALPATION: TTP L SI joint area.  L ASIS more posterior compared to R   LUMBAR ROM:   AROM eval  Flexion WFL  Extension WFL with R lumbar rotation  Right lateral flexion WFL  Left lateral flexion WFL  Right rotation ful  Left rotation ful     Lumbar extension with R rotation WFL  Lumbar extension with L rotation  WFL with slight symptoms   (Blank rows = not tested)   LOWER EXTREMITY ROM:     Passive  Right eval Left eval  Hip flexion    Hip extension    Hip abduction    Hip adduction    Hip internal rotation 22 degrees  18 degrees  Hip external rotation  [facility]  Knee flexion    Knee extension    Ankle dorsiflexion    Ankle plantarflexion    Ankle inversion    Ankle eversion     (Blank rows = not tested)  LOWER EXTREMITY MMT:    MMT Right eval Left eval  Hip flexion 4 4  Hip extension 4 with lumbar extension compensation 4 with lumbar extension compensation  Hip abduction 4 4  Hip adduction    Hip internal rotation    Hip external rotation    Knee flexion 5 5  Knee extension 5 5  Ankle dorsiflexion    Ankle plantarflexion    Ankle inversion    Ankle eversion     (Blank rows = not tested)  LUMBAR SPECIAL TESTS:  (-) repeated flexion test Long sit test  suggests posterior nutation of L innominate.    FUNCTIONAL TESTS:    GAIT: Distance walked: 60 ft Assistive device utilized:  None Level of assistance: Complete Independence Comments: decreased stance L LE, B pelvic drop  TREATMENT DATE: 05/29/2023                                                                                                                               Neuromuscular re-education  Hooklying  lower trunk rotation 10x5 seconds each direction for 3 sets   SLR hip flexion L LE 10x5 seconds for 2 sets  [facility] R with hip extension isometrics at end range 10x5 seconds for 2 sets  Posterior pelvic tilt 10x10 seconds for 2 sets  Reverse crunch 10x2  Crunch   To the R 5x3  To the L 5x3   Quadruped   Hip extension    R 10x2   L 10x2  Single leg dead lift with contralateral UE assist, pain free range for knees  R 10x2  L 10x2     Improved exercise technique, movement at target joints, use of target muscles after mod verbal, visual, tactile cues.      PATIENT EDUCATION:  Education details: there-ex, HEP Person educated: Patient Education method: Explanation, Demonstration, Tactile cues, Verbal cues, and Handouts Education comprehension: verbalized understanding and returned demonstration  HOME EXERCISE PROGRAM: Access Code: [email or web address] URL: [email or web address] Date: 05/17/2023 Prepared by: Suzzane Estes  Exercises - Supine Piriformis Stretch with Leg Straight  - 3 x daily - 7 x weekly - 1 sets - 5 reps - 30 seconds  hold - Seated Hip Internal Rotation AROM  - 1 x daily - 7 x weekly - 3 sets - 10 reps - 5 seconds hold - Right Standing Lateral Shift Correction at Wall - Repetitions  - 1 x daily - 7 x weekly - 3 sets - 10 reps - 5 seconds hold  - Standing Hip Abduction with Resistance at Ankles and Counter Support  - 1 x daily - 7 x weekly - 2 sets - 10 reps  Yellow band  - Supine March with Posterior Pelvic Tilt  - 1 x daily - 7 x weekly - 3 sets - 10 reps  - Supine Lower Trunk Rotation  - 1 x daily - 7 x weekly - 3 sets - 10 reps - 5 seconds hold  - Supine  Posterior Pelvic Tilt  - 1 x daily - 7 x weekly - 3 sets - 10 reps - 10 seconds hold  - Forward T with Counter Support  - 1 x daily - 7 x weekly - 2-3 sets - 10 reps   ASSESSMENT:  CLINICAL IMPRESSION:  Worked on improving lumbar rotation mobility, trunk and glute strength to decrease stress to low back. Good muscle use felt with exercises. Pt tolerated session well without aggravation of symptoms.  Pt will benefit from continued skilled physical therapy services to decrease pain, improve strength  and function.     OBJECTIVE IMPAIRMENTS: difficulty walking, decreased ROM, decreased strength, improper body mechanics, postural dysfunction, and pain.   ACTIVITY LIMITATIONS: standing and locomotion level  PARTICIPATION LIMITATIONS:   PERSONAL FACTORS: Age, Fitness, Time since onset of injury/illness/exacerbation, and 1-2 comorbidities: Osteopenia, arthritis, skin CA  are also affecting patient's functional outcome.   REHAB POTENTIAL: Fair    CLINICAL DECISION MAKING: Stable/uncomplicated  EVALUATION COMPLEXITY: Low   GOALS: Goals reviewed with patient? Yes  SHORT TERM GOALS: Target date: 06/02/2023  Pt will be independent with her initial HEP to improve posture, strength, function, and ability to perform standing tasks and ambulate longer distances more comfortably for her back.  Baseline: Pt has started her initial HEP. (05/17/2023) Goal status: INITIAL      LONG TERM GOALS: Target date: 07/14/2023  Pt will have a decrease in low back pain to 2/10 or less at worst to promote ability to ambulate longer distances, perform standing tasks, and play tennis more comfortably for her back.  Baseline: L low back pain. 5/10 at worst for the past 3 months  (05/17/2023) Goal status: INITIAL  2.  Pt will improve B hip extension and abduction strength by at least 1/2 MMT grade to promote ability to perform standing tasks as well as ambulate with less low back pain.  Baseline:  MMT  Right eval Left eval  Hip extension 4 with lumbar extension compensation 4 with lumbar extension compensation  Hip abduction 4 4   (05/17/2023)   Goal status: INITIAL  3.  Pt will improve B hip IR ROM by at least 10 degrees to promote ability to ambulate longer distances more comfortably for her back.  Baseline:  Passive  Right eval Left eval  Hip internal rotation 22 degrees  18 degrees   (05/17/2023)  Goal status: INITIAL  4.  Pt will improve her Modified Oswestry Low Back Pain disability Questionnaire by at least 10% as a demonstration of improved function.  Baseline: 13/50 (26%) (05/17/2023) Goal status: INITIAL   PLAN:  PT FREQUENCY: 1-2x/week  PT DURATION: 8 weeks  PLANNED INTERVENTIONS: 97110-Therapeutic exercises, 97530- Therapeutic activity, 97112- Neuromuscular re-education, 97535- Self Care, 69629- Manual therapy, [id number]- Electrical stimulation (unattended), [phone]- Ionotophoresis 4mg /ml Dexamethasone, Patient/Family education, and Dry Needling.  PLAN FOR NEXT SESSION: Posture, trunk and hip strengthening, hip ROM, manual techniques, modalities PRN   Carlisa Eble, PT, DPT 05/29/2023, 12:45 PM

## 2023-05-31 ENCOUNTER — Ambulatory Visit

## 2023-05-31 DIAGNOSIS — M5459 Other low back pain: Secondary | ICD-10-CM | POA: Diagnosis not present

## 2023-05-31 DIAGNOSIS — R293 Abnormal posture: Secondary | ICD-10-CM | POA: Diagnosis not present

## 2023-05-31 DIAGNOSIS — M25512 Pain in left shoulder: Secondary | ICD-10-CM | POA: Diagnosis not present

## 2023-05-31 DIAGNOSIS — M25612 Stiffness of left shoulder, not elsewhere classified: Secondary | ICD-10-CM | POA: Diagnosis not present

## 2023-05-31 NOTE — Therapy (Signed)
 OUTPATIENT PHYSICAL THERAPY TREATMENT   Patient Name: Natalie Sosa MRN: [id number] DOB:09/04/1945, 78 y.o., female Today's Date: 05/31/2023  END OF SESSION:  PT End of Session - 05/31/23 1111     Visit Number 5    Number of Visits 17    Date for PT Re-Evaluation 07/14/23    PT Start Time 1111    PT Stop Time 1152    PT Time Calculation (min) 41 min    Activity Tolerance Patient tolerated treatment well    Behavior During Therapy [facility] for tasks assessed/performed                 Past Medical History:  Diagnosis Date   Arthritis    right knee, right thumb   Bright red rectal bleeding 09/01/2021   Cancer (HCC)    skin   COVID-19 12/31/2020   ENDOMETRIOSIS 10/04/2006   Qualifier: History of  By: Roxine Cordia MD, Candie Chamber    Suprapubic pressure 09/01/2021   Past Surgical History:  Procedure Laterality Date   ACHILLES TENDON REPAIR  06/14/2004   CATARACT EXTRACTION W/PHACO Right 11/09/2022   Procedure: CATARACT EXTRACTION PHACO AND INTRAOCULAR LENS PLACEMENT (IOC) RIGHT TORIC 7.96 00:48.4;  Surgeon: Annell Kidney, MD;  Location: [facility];  Service: Ophthalmology;  Laterality: Right;   CATARACT EXTRACTION W/PHACO Left 12/07/2022   Procedure: CATARACT EXTRACTION PHACO AND INTRAOCULAR LENS PLACEMENT (IOC) LEFT  CLAREON VIVITY TORIC LENS 7.43 00:49.5;  Surgeon: Annell Kidney, MD;  Location: [facility];  Service: Ophthalmology;  Laterality: Left;   CESAREAN SECTION     placenta previa   CHOLECYSTECTOMY  06/13/2001   COLONOSCOPY WITH PROPOFOL  N/A 09/24/2021   Procedure: COLONOSCOPY WITH PROPOFOL ;  Surgeon: Selena Daily, MD;  Location: [facility];  Service: Gastroenterology;  Laterality: N/A;   Patient Active Problem List   Diagnosis Date Noted   Adjustment reaction with anxiety and depression 03/01/2023   Osteoarthritis 02/17/2022   Encounter for annual general medical examination with abnormal findings in adult 02/17/2022    Arthritis of carpometacarpal [facility]) joint of right thumb 12/28/2021   Polyp of cecum    Unilateral primary osteoarthritis, right knee 09/22/2021   Screen for colon cancer 01/11/2016   Insomnia 01/01/2014   HYPERCHOLESTEROLEMIA 10/04/2006    PCP: Gabriel John, NP   REFERRING PROVIDER: Arnie Lao MD  REFERRING DIAG: Low back pain, DDD  Rationale for Evaluation and Treatment: Rehabilitation  THERAPY DIAG:  Other low back pain  ONSET DATE: 5-6 years ago  SUBJECTIVE:  SUBJECTIVE STATEMENT:  Low back is good. Tennis got cancelled so she was not able to play. No low back pain currently. A lot of creaking and popping though. Upright posture and activating her core muscles help her stand and walk more comfortably for her back.        PERTINENT HISTORY:  Low back pain.  Pt was playing tennis 6 years ago. Pt was trying to arch her back to get a better tennis serve. After a while pain worsened. Pt then went to a chiropractor but also rested. Got an x-ray which revealed scoliosis on L side. The pain would take her breath away. Sitting and resting helps. Has been dealing with her back pain ever since. Has not hurt that bad again. Pt also has R dorsal foot numbness, got an MRI for her low back.    No latex allergies No blood pressure problems per pt.  Has osteopenia/osteoporosis   PAIN:  Are you having pain? Yes: NPRS scale: 0/10 Pain location: L low back Pain description: sharp Aggravating factors: Lunging to the R or L while playing tennis; serving at tennis (does not do that anymore), walking about 15 minutes; standing for 30 minutes Relieving factors: sitting for 5 minutes   PRECAUTIONS: Osteopenia per bone density on 04/13/2023  RED FLAGS: Bowel or bladder incontinence: No  and Cauda equina syndrome: No   WEIGHT BEARING RESTRICTIONS: No  FALLS:  Has patient fallen in last 6 months? No  LIVING ENVIRONMENT: Lives with: lives with their spouse Lives in: House/apartment Stairs: No Has following equipment at home: None  OCCUPATION: retired  PLOF: Independent  PATIENT GOALS: Get her muscles strong enough so the degeneration does not get worse.   NEXT MD VISIT: None yet  OBJECTIVE:  Note: Objective measures were completed at Evaluation unless otherwise noted.  DIAGNOSTIC FINDINGS:  MR Lumbar Spine w/o contrast 03/31/2023  Narrative & Impression  CLINICAL DATA:  78 year old female with 4 months of low back pain. Numbness at the top of the right foot.   EXAM: MRI LUMBAR SPINE WITHOUT CONTRAST   TECHNIQUE: Multiplanar, multisequence MR imaging of the lumbar spine was performed. No intravenous contrast was administered.   COMPARISON:  None Available.   FINDINGS: Segmentation: Lumbar segmentation appears to be normal and will be designated as such for this report.   Alignment: Levoconvex lumbar scoliosis is mild-to-moderate, apex at L3-L4. Associated straightening of lumbar lordosis.   Vertebrae: Normal background bone marrow signal. Widespread lumbar degenerative endplate spurring. Mostly chronic degenerative endplate marrow signal changes associated. Faint superimposed endplate marrow edema such as at L2-L3 and L3-L4 on series 109, image 7. Intact visible sacrum and SI joints. Incidental small S2-S3 sacral Tarlov cysts (normal variant).   Conus medullaris and cauda equina: Conus extends to the L1-L2 level. No lower spinal cord or conus signal abnormality. Generally normal cauda equina nerve roots.   Paraspinal and other soft tissues: Negative.   Disc levels:   T11-T12 and   T12-L1:  Negative.   L1-L2: Disc desiccation and disc space loss. Disc bulging asymmetric to the left. No spinal or convincing lateral recess stenosis.  Mild mostly left side L1 foraminal stenosis.   L2-L3: Severe disc space loss. Circumferential disc osteophyte complex. Mild facet and ligament flavum hypertrophy greater on the right. No spinal or convincing lateral recess stenosis. Mild more so than moderate bilateral L2 neural foraminal stenosis.   L3-L4: Similar disc space loss. Circumferential disc bulging and endplate spurring with asymmetric left foraminal component of  disc on series 7, image 13. Mild facet and ligament flavum hypertrophy. No spinal or lateral recess stenosis. Moderate left and mild to moderate right L3 neural foraminal stenosis.   L4-L5: Severe disc space loss. Circumferential disc osteophyte complex. Mild to moderate facet and ligament flavum hypertrophy greater on the left. No significant spinal stenosis. Mild left lateral recess stenosis (descending left L5 nerve level series 115, image 27). Moderate left and moderate to severe right (series 7, image 4) L4 neural foraminal stenosis.   L5-S1: Better preserved disc space. Circumferential disc bulging is asymmetric to the left. Moderate facet and ligament flavum hypertrophy greater on the left. Degenerative left facet joint fluid. No spinal stenosis, but moderate left lateral recess stenosis (descending left L5 nerve level series 115, image 32). Severe left L5 neural foraminal stenosis (series 7, image 11). Only borderline to mild right foraminal stenosis.   IMPRESSION: 1. Advanced lumbar spine disc and endplate degeneration in the setting of mild to moderate levoconvex scoliosis. Faint degenerative mid lumbar endplate marrow edema. 2. No significant lumbar spinal stenosis due to capacious underlying spinal canal. Right side neural impingement maximal at the right L4 neural foramina, multifactorial. Multilevel moderate and occasionally severe (left L5 nerve level) left side lateral recess and foraminal stenosis.     Electronically Signed   By: Marlise Simpers  M.D.   On: 04/17/2023 08:34     PATIENT SURVEYS:  Modified Oswestry 13/50 (26%)   COGNITION: Overall cognitive status: Within functional limits for tasks assessed     SENSATION: WFL  MUSCLE LENGTH:   POSTURE: forward neck, movement preference around C5/C6 area, and C3/C4 area, B protracted shoulders, R shoulder higher, thoracic kyphosis, R lateral shift   R  convexity at thoracolumbar junction L lumbar convexity around L2,3,4 area L lumbar rotation  B genu valgus R > L   Improved low back comfort level with manual posturral correction from PT  Prone position: L posterior pelvic rotation    PALPATION: TTP L SI joint area.  L ASIS more posterior compared to R   LUMBAR ROM:   AROM eval  Flexion WFL  Extension WFL with R lumbar rotation  Right lateral flexion WFL  Left lateral flexion WFL  Right rotation ful  Left rotation ful     Lumbar extension with R rotation WFL  Lumbar extension with L rotation  WFL with slight symptoms   (Blank rows = not tested)   LOWER EXTREMITY ROM:     Passive  Right eval Left eval  Hip flexion    Hip extension    Hip abduction    Hip adduction    Hip internal rotation 22 degrees  18 degrees  Hip external rotation  [facility]  Knee flexion    Knee extension    Ankle dorsiflexion    Ankle plantarflexion    Ankle inversion    Ankle eversion     (Blank rows = not tested)  LOWER EXTREMITY MMT:    MMT Right eval Left eval  Hip flexion 4 4  Hip extension 4 with lumbar extension compensation 4 with lumbar extension compensation  Hip abduction 4 4  Hip adduction    Hip internal rotation    Hip external rotation    Knee flexion 5 5  Knee extension 5 5  Ankle dorsiflexion    Ankle plantarflexion    Ankle inversion    Ankle eversion     (Blank rows = not tested)  LUMBAR SPECIAL TESTS:  (-) repeated  flexion test Long sit test suggests posterior nutation of L innominate.    FUNCTIONAL TESTS:    GAIT: Distance walked:  60 ft Assistive device utilized: None Level of assistance: Complete Independence Comments: decreased stance L LE, B pelvic drop  TREATMENT DATE: 05/31/2023                                                                                                                               Neuromuscular re-education  Quadruped   Hip extension    R 10x3   L 10x3  Side stepping with red band around knees 30 ft to the R and 30 ft to the L for 2 sets  Monster walk with red band around knees 30 ft forward 2x  Standing hip machine height   Hip extnsion   R plate 40 for 16X0   L plate 40 for 96E4    Hip abduction    R plate 25 for [id number]   L plate 25 for 81X9   At [facility] machine Standing B shoulder extension, plate 10 for 14N8 seconds for 2 sets  Standing static lateral lunge  R 10x2  L 10x2  Cues for femoral control to decrease R lateral knee pain         Improved exercise technique, movement at target joints, use of target muscles after mod verbal, visual, tactile cues.      PATIENT EDUCATION:  Education details: there-ex, HEP Person educated: Patient Education method: Explanation, Demonstration, Tactile cues, Verbal cues, and Handouts Education comprehension: verbalized understanding and returned demonstration  HOME EXERCISE PROGRAM: Access Code: [email or web address] URL: [email or web address] Date: 05/17/2023 Prepared by: Suzzane Estes  Exercises - Supine Piriformis Stretch with Leg Straight  - 3 x daily - 7 x weekly - 1 sets - 5 reps - 30 seconds  hold - Seated Hip Internal Rotation AROM  - 1 x daily - 7 x weekly - 3 sets - 10 reps - 5 seconds hold - Right Standing Lateral Shift Correction at Wall - Repetitions  - 1 x daily - 7 x weekly - 3 sets - 10 reps - 5 seconds hold  - Standing Hip Abduction with Resistance at Ankles and Counter Support  - 1 x daily - 7 x weekly - 2 sets - 10 reps  Yellow band  - Supine March with Posterior Pelvic Tilt  - 1 x daily - 7 x  weekly - 3 sets - 10 reps  - Supine Lower Trunk Rotation  - 1 x daily - 7 x weekly - 3 sets - 10 reps - 5 seconds hold  - Supine Posterior Pelvic Tilt  - 1 x daily - 7 x weekly - 3 sets - 10 reps - 10 seconds hold  - Forward T with Counter Support  - 1 x daily - 7 x weekly - 2-3 sets - 10 reps   ASSESSMENT:  CLINICAL IMPRESSION:  Continued working on trunk and glute strength  to decrease stress to low back. Good muscle use felt with exercises. Pt tolerated session well without aggravation of symptoms.  Pt will benefit from continued skilled physical therapy services to decrease pain, improve strength and function.     OBJECTIVE IMPAIRMENTS: difficulty walking, decreased ROM, decreased strength, improper body mechanics, postural dysfunction, and pain.   ACTIVITY LIMITATIONS: standing and locomotion level  PARTICIPATION LIMITATIONS:   PERSONAL FACTORS: Age, Fitness, Time since onset of injury/illness/exacerbation, and 1-2 comorbidities: Osteopenia, arthritis, skin CA  are also affecting patient's functional outcome.   REHAB POTENTIAL: Fair    CLINICAL DECISION MAKING: Stable/uncomplicated  EVALUATION COMPLEXITY: Low   GOALS: Goals reviewed with patient? Yes  SHORT TERM GOALS: Target date: 06/02/2023  Pt will be independent with her initial HEP to improve posture, strength, function, and ability to perform standing tasks and ambulate longer distances more comfortably for her back.  Baseline: Pt has started her initial HEP. (05/17/2023) Goal status: INITIAL      LONG TERM GOALS: Target date: 07/14/2023  Pt will have a decrease in low back pain to 2/10 or less at worst to promote ability to ambulate longer distances, perform standing tasks, and play tennis more comfortably for her back.  Baseline: L low back pain. 5/10 at worst for the past 3 months  (05/17/2023) Goal status: INITIAL  2.  Pt will improve B hip extension and abduction strength by at least 1/2 MMT grade to  promote ability to perform standing tasks as well as ambulate with less low back pain.  Baseline:  MMT Right eval Left eval  Hip extension 4 with lumbar extension compensation 4 with lumbar extension compensation  Hip abduction 4 4   (05/17/2023)   Goal status: INITIAL  3.  Pt will improve B hip IR ROM by at least 10 degrees to promote ability to ambulate longer distances more comfortably for her back.  Baseline:  Passive  Right eval Left eval  Hip internal rotation 22 degrees  18 degrees   (05/17/2023)  Goal status: INITIAL  4.  Pt will improve her Modified Oswestry Low Back Pain disability Questionnaire by at least 10% as a demonstration of improved function.  Baseline: 13/50 (26%) (05/17/2023) Goal status: INITIAL    PLAN:  PT FREQUENCY: 1-2x/week  PT DURATION: 8 weeks  PLANNED INTERVENTIONS: 97110-Therapeutic exercises, 97530- Therapeutic activity, [id number]- Neuromuscular re-education, 97535- Self Care, 16109- Manual therapy, [id number]- Electrical stimulation (unattended), [phone]- Ionotophoresis 4mg /ml Dexamethasone, Patient/Family education, and Dry Needling.  PLAN FOR NEXT SESSION: Posture, trunk and hip strengthening, hip ROM, manual techniques, modalities PRN   Mirjana Tarleton, PT, DPT 05/31/2023, 11:53 AM

## 2023-06-05 ENCOUNTER — Ambulatory Visit

## 2023-06-05 DIAGNOSIS — M5459 Other low back pain: Secondary | ICD-10-CM

## 2023-06-05 DIAGNOSIS — R293 Abnormal posture: Secondary | ICD-10-CM | POA: Diagnosis not present

## 2023-06-05 DIAGNOSIS — M25512 Pain in left shoulder: Secondary | ICD-10-CM | POA: Diagnosis not present

## 2023-06-05 DIAGNOSIS — M25612 Stiffness of left shoulder, not elsewhere classified: Secondary | ICD-10-CM | POA: Diagnosis not present

## 2023-06-05 NOTE — Therapy (Signed)
 OUTPATIENT PHYSICAL THERAPY TREATMENT   Patient Name: LESLIAN BATTAGLINO MRN: [id number] DOB:[birth year], 78 y.o., female Today's Date: 06/05/2023  END OF SESSION:  PT End of Session - 06/05/23 0907     Visit Number 6    Number of Visits 17    Date for PT Re-Evaluation 07/14/23    PT Start Time 0908    PT Stop Time 0948    PT Time Calculation (min) 40 min    Activity Tolerance Patient tolerated treatment well    Behavior During Therapy [facility] for tasks assessed/performed                  Past Medical History:  Diagnosis Date   Arthritis    right knee, right thumb   Bright red rectal bleeding 09/01/2021   Cancer (HCC)    skin   COVID-19 12/31/2020   ENDOMETRIOSIS 10/04/2006   Qualifier: History of  By: Roxine Cordia MD, Candie Chamber    Suprapubic pressure 09/01/2021   Past Surgical History:  Procedure Laterality Date   ACHILLES TENDON REPAIR  06/14/2004   CATARACT EXTRACTION W/PHACO Right 11/09/2022   Procedure: CATARACT EXTRACTION PHACO AND INTRAOCULAR LENS PLACEMENT (IOC) RIGHT TORIC 7.96 00:48.4;  Surgeon: Annell Kidney, MD;  Location: [facility];  Service: Ophthalmology;  Laterality: Right;   CATARACT EXTRACTION W/PHACO Left 12/07/2022   Procedure: CATARACT EXTRACTION PHACO AND INTRAOCULAR LENS PLACEMENT (IOC) LEFT  CLAREON VIVITY TORIC LENS 7.43 00:49.5;  Surgeon: Annell Kidney, MD;  Location: [facility];  Service: Ophthalmology;  Laterality: Left;   CESAREAN SECTION     placenta previa   CHOLECYSTECTOMY  06/13/2001   COLONOSCOPY WITH PROPOFOL  N/A 09/24/2021   Procedure: COLONOSCOPY WITH PROPOFOL ;  Surgeon: Selena Daily, MD;  Location: [facility];  Service: Gastroenterology;  Laterality: N/A;   Patient Active Problem List   Diagnosis Date Noted   Adjustment reaction with anxiety and depression 03/01/2023   Osteoarthritis 02/17/2022   Encounter for annual general medical examination with abnormal findings in adult 02/17/2022    Arthritis of carpometacarpal [facility]) joint of right thumb 12/28/2021   Polyp of cecum    Unilateral primary osteoarthritis, right knee 09/22/2021   Screen for colon cancer 01/11/2016   Insomnia 01/01/2014   HYPERCHOLESTEROLEMIA 10/04/2006    PCP: Gabriel John, NP   REFERRING PROVIDER: Arnie Lao MD  REFERRING DIAG: Low back pain, DDD  Rationale for Evaluation and Treatment: Rehabilitation  THERAPY DIAG:  Other low back pain  ONSET DATE: 5-6 years ago  SUBJECTIVE:  SUBJECTIVE STATEMENT: Low back is ok, no pain currently. Took a walk the day after she was here. Low back started hurting a little bit after 20 minutes of walking. L low back started hurting more after walking another 5 minutes. Back pain eased up but did not go away when she walked more.     PERTINENT HISTORY:  Low back pain.  Pt was playing tennis 6 years ago. Pt was trying to arch her back to get a better tennis serve. After a while pain worsened. Pt then went to a chiropractor but also rested. Got an x-ray which revealed scoliosis on L side. The pain would take her breath away. Sitting and resting helps. Has been dealing with her back pain ever since. Has not hurt that bad again. Pt also has R dorsal foot numbness, got an MRI for her low back.    No latex allergies No blood pressure problems per pt.  Has osteopenia/osteoporosis   PAIN:  Are you having pain? Yes: NPRS scale: 0/10 Pain location: L low back Pain description: sharp Aggravating factors: Lunging to the R or L while playing tennis; serving at tennis (does not do that anymore), walking about 15 minutes; standing for 30 minutes Relieving factors: sitting for 5 minutes   PRECAUTIONS: Osteopenia per bone density on 04/13/2023  RED FLAGS: Bowel or  bladder incontinence: No and Cauda equina syndrome: No   WEIGHT BEARING RESTRICTIONS: No  FALLS:  Has patient fallen in last 6 months? No  LIVING ENVIRONMENT: Lives with: lives with their spouse Lives in: House/apartment Stairs: No Has following equipment at home: None  OCCUPATION: retired  PLOF: Independent  PATIENT GOALS: Get her muscles strong enough so the degeneration does not get worse.   NEXT MD VISIT: None yet  OBJECTIVE:  Note: Objective measures were completed at Evaluation unless otherwise noted.  DIAGNOSTIC FINDINGS:  MR Lumbar Spine w/o contrast 03/31/2023  Narrative & Impression  CLINICAL DATA:  78 year old female with 4 months of low back pain. Numbness at the top of the right foot.   EXAM: MRI LUMBAR SPINE WITHOUT CONTRAST   TECHNIQUE: Multiplanar, multisequence MR imaging of the lumbar spine was performed. No intravenous contrast was administered.   COMPARISON:  None Available.   FINDINGS: Segmentation: Lumbar segmentation appears to be normal and will be designated as such for this report.   Alignment: Levoconvex lumbar scoliosis is mild-to-moderate, apex at L3-L4. Associated straightening of lumbar lordosis.   Vertebrae: Normal background bone marrow signal. Widespread lumbar degenerative endplate spurring. Mostly chronic degenerative endplate marrow signal changes associated. Faint superimposed endplate marrow edema such as at L2-L3 and L3-L4 on series 109, image 7. Intact visible sacrum and SI joints. Incidental small S2-S3 sacral Tarlov cysts (normal variant).   Conus medullaris and cauda equina: Conus extends to the L1-L2 level. No lower spinal cord or conus signal abnormality. Generally normal cauda equina nerve roots.   Paraspinal and other soft tissues: Negative.   Disc levels:   T11-T12 and   T12-L1:  Negative.   L1-L2: Disc desiccation and disc space loss. Disc bulging asymmetric to the left. No spinal or convincing lateral  recess stenosis. Mild mostly left side L1 foraminal stenosis.   L2-L3: Severe disc space loss. Circumferential disc osteophyte complex. Mild facet and ligament flavum hypertrophy greater on the right. No spinal or convincing lateral recess stenosis. Mild more so than moderate bilateral L2 neural foraminal stenosis.   L3-L4: Similar disc space loss. Circumferential disc bulging and endplate spurring with  asymmetric left foraminal component of disc on series 7, image 13. Mild facet and ligament flavum hypertrophy. No spinal or lateral recess stenosis. Moderate left and mild to moderate right L3 neural foraminal stenosis.   L4-L5: Severe disc space loss. Circumferential disc osteophyte complex. Mild to moderate facet and ligament flavum hypertrophy greater on the left. No significant spinal stenosis. Mild left lateral recess stenosis (descending left L5 nerve level series 115, image 27). Moderate left and moderate to severe right (series 7, image 4) L4 neural foraminal stenosis.   L5-S1: Better preserved disc space. Circumferential disc bulging is asymmetric to the left. Moderate facet and ligament flavum hypertrophy greater on the left. Degenerative left facet joint fluid. No spinal stenosis, but moderate left lateral recess stenosis (descending left L5 nerve level series 115, image 32). Severe left L5 neural foraminal stenosis (series 7, image 11). Only borderline to mild right foraminal stenosis.   IMPRESSION: 1. Advanced lumbar spine disc and endplate degeneration in the setting of mild to moderate levoconvex scoliosis. Faint degenerative mid lumbar endplate marrow edema. 2. No significant lumbar spinal stenosis due to capacious underlying spinal canal. Right side neural impingement maximal at the right L4 neural foramina, multifactorial. Multilevel moderate and occasionally severe (left L5 nerve level) left side lateral recess and foraminal stenosis.     Electronically  Signed   By: Marlise Simpers M.D.   On: 04/17/2023 08:34     PATIENT SURVEYS:  Modified Oswestry 13/50 (26%)   COGNITION: Overall cognitive status: Within functional limits for tasks assessed     SENSATION: WFL  MUSCLE LENGTH:   POSTURE: forward neck, movement preference around C5/C6 area, and C3/C4 area, B protracted shoulders, R shoulder higher, thoracic kyphosis, R lateral shift   R  convexity at thoracolumbar junction L lumbar convexity around L2,3,4 area L lumbar rotation  B genu valgus R > L   Improved low back comfort level with manual posturral correction from PT  Prone position: L posterior pelvic rotation    PALPATION: TTP L SI joint area.  L ASIS more posterior compared to R   LUMBAR ROM:   AROM eval  Flexion WFL  Extension WFL with R lumbar rotation  Right lateral flexion WFL  Left lateral flexion WFL  Right rotation ful  Left rotation ful     Lumbar extension with R rotation WFL  Lumbar extension with L rotation  WFL with slight symptoms   (Blank rows = not tested)   LOWER EXTREMITY ROM:     Passive  Right eval Left eval  Hip flexion    Hip extension    Hip abduction    Hip adduction    Hip internal rotation 22 degrees  18 degrees  Hip external rotation  [facility]  Knee flexion    Knee extension    Ankle dorsiflexion    Ankle plantarflexion    Ankle inversion    Ankle eversion     (Blank rows = not tested)  LOWER EXTREMITY MMT:    MMT Right eval Left eval  Hip flexion 4 4  Hip extension 4 with lumbar extension compensation 4 with lumbar extension compensation  Hip abduction 4 4  Hip adduction    Hip internal rotation    Hip external rotation    Knee flexion 5 5  Knee extension 5 5  Ankle dorsiflexion    Ankle plantarflexion    Ankle inversion    Ankle eversion     (Blank rows = not tested)  LUMBAR  SPECIAL TESTS:  (-) repeated flexion test Long sit test suggests posterior nutation of L innominate.    FUNCTIONAL TESTS:     GAIT: Distance walked: 60 ft Assistive device utilized: None Level of assistance: Complete Independence Comments: decreased stance L LE, B pelvic drop  TREATMENT DATE: 06/05/2023                                                                                                                               Neuromuscular re-education  Gait x 100 ft  Pt demonstrates L pelvic drop with R lumbar side bend compensation during R LE stance phase.   SLS with contralateral UE assist and with contralateral LE hip flexion and extension   R 1 minute x 3  Side stepping with red band around knees 30 ft to the R and 30 ft to the L 2x  Standing with B UE assist   Hip abduction with red band around ankles    R 10x3   L 10x3  Standing hip machine height  4  Hip extnsion   R plate 40 for 95A2   L plate 40 for 13Y8   Hip abduction    R plate 25 for 65H8   L plate 25 for 46N6  Standing static lateral lunge  R 10x2  L 10x2  Cues for femoral control to decrease R lateral knee pain   At [facility] machine Standing B shoulder extension, plate 10 for 29B2 seconds            Improved exercise technique, movement at target joints, use of target muscles after mod verbal, visual, tactile cues.      PATIENT EDUCATION:  Education details: there-ex, HEP Person educated: Patient Education method: Explanation, Demonstration, Tactile cues, Verbal cues, and Handouts Education comprehension: verbalized understanding and returned demonstration  HOME EXERCISE PROGRAM: Access Code: [email or web address] URL: [email or web address] Date: 05/17/2023 Prepared by: Suzzane Estes  Exercises - Supine Piriformis Stretch with Leg Straight  - 3 x daily - 7 x weekly - 1 sets - 5 reps - 30 seconds  hold - Seated Hip Internal Rotation AROM  - 1 x daily - 7 x weekly - 3 sets - 10 reps - 5 seconds hold - Right Standing Lateral Shift Correction at Wall - Repetitions  - 1 x daily - 7 x weekly - 3 sets - 10  reps - 5 seconds hold  - Standing Hip Abduction with Resistance at Ankles and Counter Support  - 1 x daily - 7 x weekly - 3 sets - 10 reps  Yellow band  Red band upgrade on 06/05/2023  - Supine March with Posterior Pelvic Tilt  - 1 x daily - 7 x weekly - 3 sets - 10 reps  - Supine Lower Trunk Rotation  - 1 x daily - 7 x weekly - 3 sets - 10 reps - 5 seconds hold  - Supine Posterior Pelvic Tilt  - 1 x daily -  7 x weekly - 3 sets - 10 reps - 10 seconds hold  - Forward T with Counter Support  - 1 x daily - 7 x weekly - 2-3 sets - 10 reps    SLS with contralateral UE assist and with contralateral LE hip flexion and extension   R 1 minute x 3   ASSESSMENT:  CLINICAL IMPRESSION:  Worked on R > L glute med strengthening to decrease pelvic drop and lumbar side bend compensation during gait to help decrease low back pain with walking longer distances. Good muscle use felt with exercises. Pt tolerated session well without aggravation of symptoms.  Pt will benefit from continued skilled physical therapy services to decrease pain, improve strength and function.     OBJECTIVE IMPAIRMENTS: difficulty walking, decreased ROM, decreased strength, improper body mechanics, postural dysfunction, and pain.   ACTIVITY LIMITATIONS: standing and locomotion level  PARTICIPATION LIMITATIONS:   PERSONAL FACTORS: Age, Fitness, Time since onset of injury/illness/exacerbation, and 1-2 comorbidities: Osteopenia, arthritis, skin CA are also affecting patient's functional outcome.   REHAB POTENTIAL: Fair    CLINICAL DECISION MAKING: Stable/uncomplicated  EVALUATION COMPLEXITY: Low   GOALS: Goals reviewed with patient? Yes  SHORT TERM GOALS: Target date: 06/02/2023  Pt will be independent with her initial HEP to improve posture, strength, function, and ability to perform standing tasks and ambulate longer distances more comfortably for her back.  Baseline: Pt has started her initial HEP.  (05/17/2023) Goal status: INITIAL      LONG TERM GOALS: Target date: 07/14/2023  Pt will have a decrease in low back pain to 2/10 or less at worst to promote ability to ambulate longer distances, perform standing tasks, and play tennis more comfortably for her back.  Baseline: L low back pain. 5/10 at worst for the past 3 months  (05/17/2023) Goal status: INITIAL  2.  Pt will improve B hip extension and abduction strength by at least 1/2 MMT grade to promote ability to perform standing tasks as well as ambulate with less low back pain.  Baseline:  MMT Right eval Left eval  Hip extension 4 with lumbar extension compensation 4 with lumbar extension compensation  Hip abduction 4 4   (05/17/2023)   Goal status: INITIAL  3.  Pt will improve B hip IR ROM by at least 10 degrees to promote ability to ambulate longer distances more comfortably for her back.  Baseline:  Passive  Right eval Left eval  Hip internal rotation 22 degrees  18 degrees   (05/17/2023)  Goal status: INITIAL  4.  Pt will improve her Modified Oswestry Low Back Pain disability Questionnaire by at least 10% as a demonstration of improved function.  Baseline: 13/50 (26%) (05/17/2023) Goal status: INITIAL    PLAN:  PT FREQUENCY: 1-2x/week  PT DURATION: 8 weeks  PLANNED INTERVENTIONS: 97110-Therapeutic exercises, 97530- Therapeutic activity, [id number]- Neuromuscular re-education, 97535- Self Care, 09811- Manual therapy, G0283- Electrical stimulation (unattended), [phone]- Ionotophoresis 4mg /ml Dexamethasone, Patient/Family education, and Dry Needling.  PLAN FOR NEXT SESSION: Posture, trunk and hip strengthening, hip ROM, manual techniques, modalities PRN   Kanishk Stroebel, PT, DPT 06/05/2023, 5:27 PM

## 2023-06-09 ENCOUNTER — Ambulatory Visit

## 2023-06-09 DIAGNOSIS — M5459 Other low back pain: Secondary | ICD-10-CM

## 2023-06-09 DIAGNOSIS — R293 Abnormal posture: Secondary | ICD-10-CM | POA: Diagnosis not present

## 2023-06-09 DIAGNOSIS — M25512 Pain in left shoulder: Secondary | ICD-10-CM | POA: Diagnosis not present

## 2023-06-09 DIAGNOSIS — M25612 Stiffness of left shoulder, not elsewhere classified: Secondary | ICD-10-CM | POA: Diagnosis not present

## 2023-06-09 NOTE — Therapy (Signed)
 OUTPATIENT PHYSICAL THERAPY TREATMENT   Patient Name: Natalie Sosa MRN: [id number] DOB:[birth year], 78 y.o., female Today's Date: 06/09/2023  END OF SESSION:  PT End of Session - 06/09/23 0951     Visit Number 7    Number of Visits 17    Date for PT Re-Evaluation 07/14/23    PT Start Time 0951    PT Stop Time 1030    PT Time Calculation (min) 39 min    Activity Tolerance Patient tolerated treatment well    Behavior During Therapy [facility] for tasks assessed/performed                   Past Medical History:  Diagnosis Date   Arthritis    right knee, right thumb   Bright red rectal bleeding 09/01/2021   Cancer (HCC)    skin   COVID-19 12/31/2020   ENDOMETRIOSIS 10/04/2006   Qualifier: History of  By: Roxine Cordia MD, Candie Chamber    Suprapubic pressure 09/01/2021   Past Surgical History:  Procedure Laterality Date   ACHILLES TENDON REPAIR  06/14/2004   CATARACT EXTRACTION W/PHACO Right 11/09/2022   Procedure: CATARACT EXTRACTION PHACO AND INTRAOCULAR LENS PLACEMENT (IOC) RIGHT TORIC 7.96 00:48.4;  Surgeon: Annell Kidney, MD;  Location: [facility];  Service: Ophthalmology;  Laterality: Right;   CATARACT EXTRACTION W/PHACO Left 12/07/2022   Procedure: CATARACT EXTRACTION PHACO AND INTRAOCULAR LENS PLACEMENT (IOC) LEFT  CLAREON VIVITY TORIC LENS 7.43 00:49.5;  Surgeon: Annell Kidney, MD;  Location: [facility];  Service: Ophthalmology;  Laterality: Left;   CESAREAN SECTION     placenta previa   CHOLECYSTECTOMY  06/13/2001   COLONOSCOPY WITH PROPOFOL  N/A 09/24/2021   Procedure: COLONOSCOPY WITH PROPOFOL ;  Surgeon: Selena Daily, MD;  Location: [facility];  Service: Gastroenterology;  Laterality: N/A;   Patient Active Problem List   Diagnosis Date Noted   Adjustment reaction with anxiety and depression 03/01/2023   Osteoarthritis 02/17/2022   Encounter for annual general medical examination with abnormal findings in adult 02/17/2022    Arthritis of carpometacarpal [facility]) joint of right thumb 12/28/2021   Polyp of cecum    Unilateral primary osteoarthritis, right knee 09/22/2021   Screen for colon cancer 01/11/2016   Insomnia 01/01/2014   HYPERCHOLESTEROLEMIA 10/04/2006    PCP: Gabriel John, NP   REFERRING PROVIDER: Arnie Lao MD  REFERRING DIAG: Low back pain, DDD  Rationale for Evaluation and Treatment: Rehabilitation  THERAPY DIAG:  Other low back pain  ONSET DATE: 5-6 years ago  SUBJECTIVE:  SUBJECTIVE STATEMENT: Low back is ok. Walked on [place] [place], back bothered her, worked on her posture which helped. Back did not bother her when she was walking and her L shorter leg was at at lower slope at the sand. No low back pain currently. Low back did not bother her when driving 3 hours to the beach; used to bother her.       PERTINENT HISTORY:  Low back pain.  Pt was playing tennis 6 years ago. Pt was trying to arch her back to get a better tennis serve. After a while pain worsened. Pt then went to a chiropractor but also rested. Got an x-ray which revealed scoliosis on L side. The pain would take her breath away. Sitting and resting helps. Has been dealing with her back pain ever since. Has not hurt that bad again. Pt also has R dorsal foot numbness, got an MRI for her low back.    No latex allergies No blood pressure problems per pt.  Has osteopenia/osteoporosis   PAIN:  Are you having pain? Yes: NPRS scale: 0/10 Pain location: L low back Pain description: sharp Aggravating factors: Lunging to the R or L while playing tennis; serving at tennis (does not do that anymore), walking about 15 minutes; standing for 30 minutes Relieving factors: sitting for 5 minutes   PRECAUTIONS: Osteopenia per bone density  on 04/13/2023  RED FLAGS: Bowel or bladder incontinence: No and Cauda equina syndrome: No   WEIGHT BEARING RESTRICTIONS: No  FALLS:  Has patient fallen in last 6 months? No  LIVING ENVIRONMENT: Lives with: lives with their spouse Lives in: House/apartment Stairs: No Has following equipment at home: None  OCCUPATION: retired  PLOF: Independent  PATIENT GOALS: Get her muscles strong enough so the degeneration does not get worse.   NEXT MD VISIT: None yet  OBJECTIVE:  Note: Objective measures were completed at Evaluation unless otherwise noted.  DIAGNOSTIC FINDINGS:  MR Lumbar Spine w/o contrast 03/31/2023  Narrative & Impression  CLINICAL DATA:  78 year old female with 4 months of low back pain. Numbness at the top of the right foot.   EXAM: MRI LUMBAR SPINE WITHOUT CONTRAST   TECHNIQUE: Multiplanar, multisequence MR imaging of the lumbar spine was performed. No intravenous contrast was administered.   COMPARISON:  None Available.   FINDINGS: Segmentation: Lumbar segmentation appears to be normal and will be designated as such for this report.   Alignment: Levoconvex lumbar scoliosis is mild-to-moderate, apex at L3-L4. Associated straightening of lumbar lordosis.   Vertebrae: Normal background bone marrow signal. Widespread lumbar degenerative endplate spurring. Mostly chronic degenerative endplate marrow signal changes associated. Faint superimposed endplate marrow edema such as at L2-L3 and L3-L4 on series 109, image 7. Intact visible sacrum and SI joints. Incidental small S2-S3 sacral Tarlov cysts (normal variant).   Conus medullaris and cauda equina: Conus extends to the L1-L2 level. No lower spinal cord or conus signal abnormality. Generally normal cauda equina nerve roots.   Paraspinal and other soft tissues: Negative.   Disc levels:   T11-T12 and   T12-L1:  Negative.   L1-L2: Disc desiccation and disc space loss. Disc bulging asymmetric to the  left. No spinal or convincing lateral recess stenosis. Mild mostly left side L1 foraminal stenosis.   L2-L3: Severe disc space loss. Circumferential disc osteophyte complex. Mild facet and ligament flavum hypertrophy greater on the right. No spinal or convincing lateral recess stenosis. Mild more so than moderate bilateral L2 neural foraminal stenosis.   L3-L4:  Similar disc space loss. Circumferential disc bulging and endplate spurring with asymmetric left foraminal component of disc on series 7, image 13. Mild facet and ligament flavum hypertrophy. No spinal or lateral recess stenosis. Moderate left and mild to moderate right L3 neural foraminal stenosis.   L4-L5: Severe disc space loss. Circumferential disc osteophyte complex. Mild to moderate facet and ligament flavum hypertrophy greater on the left. No significant spinal stenosis. Mild left lateral recess stenosis (descending left L5 nerve level series 115, image 27). Moderate left and moderate to severe right (series 7, image 4) L4 neural foraminal stenosis.   L5-S1: Better preserved disc space. Circumferential disc bulging is asymmetric to the left. Moderate facet and ligament flavum hypertrophy greater on the left. Degenerative left facet joint fluid. No spinal stenosis, but moderate left lateral recess stenosis (descending left L5 nerve level series 115, image 32). Severe left L5 neural foraminal stenosis (series 7, image 11). Only borderline to mild right foraminal stenosis.   IMPRESSION: 1. Advanced lumbar spine disc and endplate degeneration in the setting of mild to moderate levoconvex scoliosis. Faint degenerative mid lumbar endplate marrow edema. 2. No significant lumbar spinal stenosis due to capacious underlying spinal canal. Right side neural impingement maximal at the right L4 neural foramina, multifactorial. Multilevel moderate and occasionally severe (left L5 nerve level) left side lateral recess and foraminal  stenosis.     Electronically Signed   By: Marlise Simpers M.D.   On: 04/17/2023 08:34     PATIENT SURVEYS:  Modified Oswestry 13/50 (26%)   COGNITION: Overall cognitive status: Within functional limits for tasks assessed     SENSATION: WFL  MUSCLE LENGTH:   POSTURE: forward neck, movement preference around C5/C6 area, and C3/C4 area, B protracted shoulders, R shoulder higher, thoracic kyphosis, R lateral shift   R  convexity at thoracolumbar junction L lumbar convexity around L2,3,4 area L lumbar rotation  B genu valgus R > L   Improved low back comfort level with manual posturral correction from PT  Prone position: L posterior pelvic rotation    PALPATION: TTP L SI joint area.  L ASIS more posterior compared to R   LUMBAR ROM:   AROM eval  Flexion WFL  Extension WFL with R lumbar rotation  Right lateral flexion WFL  Left lateral flexion WFL  Right rotation ful  Left rotation ful     Lumbar extension with R rotation WFL  Lumbar extension with L rotation  WFL with slight symptoms   (Blank rows = not tested)   LOWER EXTREMITY ROM:     Passive  Right eval Left eval  Hip flexion    Hip extension    Hip abduction    Hip adduction    Hip internal rotation 22 degrees  18 degrees  Hip external rotation  [facility]  Knee flexion    Knee extension    Ankle dorsiflexion    Ankle plantarflexion    Ankle inversion    Ankle eversion     (Blank rows = not tested)  LOWER EXTREMITY MMT:    MMT Right eval Left eval  Hip flexion 4 4  Hip extension 4 with lumbar extension compensation 4 with lumbar extension compensation  Hip abduction 4 4  Hip adduction    Hip internal rotation    Hip external rotation    Knee flexion 5 5  Knee extension 5 5  Ankle dorsiflexion    Ankle plantarflexion    Ankle inversion    Ankle eversion     (  Blank rows = not tested)  LUMBAR SPECIAL TESTS:  (-) repeated flexion test Long sit test suggests posterior nutation of L innominate.     FUNCTIONAL TESTS:    GAIT: Distance walked: 60 ft Assistive device utilized: None Level of assistance: Complete Independence Comments: decreased stance L LE, B pelvic drop  TREATMENT DATE: 06/09/2023                                                                                                                               Neuromuscular re-education  SLS with contralateral UE assist and with contralateral LE hip flexion and extension   R 1.5 minutes x 2   Good R glute med muscle use felt  Side stepping with red band around knees 30 ft to the R and 30 ft to the L 2x  Standing with B UE assist   Hip abduction with red band around ankles    R 10x3   L 10x3   Standing hip machine height  4  Hip extension   R plate 40 for 16X0   L plate 40 for 96E4   Hip abduction    R plate 25 for 54U9   L plate 25 for 81X9   Standing static lateral lunge  R 10x  L 10x  Cues for femoral control to decrease R lateral knee pain  Quadruped   Hip extension    L 10x5 seconds   R 10x5 seconds    Difficulty with arm strength to maintain position.   Hooklying   Crunch 10x2   Posterior pelvic tilt with march 10x2 each LE     Performed to promote lumbopelvic control during gait.          Improved exercise technique, movement at target joints, use of target muscles after mod verbal, visual, tactile cues.      PATIENT EDUCATION:  Education details: there-ex, HEP Person educated: Patient Education method: Explanation, Demonstration, Tactile cues, Verbal cues, and Handouts Education comprehension: verbalized understanding and returned demonstration  HOME EXERCISE PROGRAM: Access Code: [email or web address] URL: [email or web address] Date: 05/17/2023 Prepared by: Suzzane Estes  Exercises - Supine Piriformis Stretch with Leg Straight  - 3 x daily - 7 x weekly - 1 sets - 5 reps - 30 seconds  hold - Seated Hip Internal Rotation AROM  - 1 x daily - 7 x weekly - 3 sets  - 10 reps - 5 seconds hold - Right Standing Lateral Shift Correction at Wall - Repetitions  - 1 x daily - 7 x weekly - 3 sets - 10 reps - 5 seconds hold  - Standing Hip Abduction with Resistance at Ankles and Counter Support  - 1 x daily - 7 x weekly - 3 sets - 10 reps  Yellow band  Red band upgrade on 06/05/2023  - Supine March with Posterior Pelvic Tilt  - 1 x daily - 7 x weekly - 3 sets - 10 reps  -  Supine Lower Trunk Rotation  - 1 x daily - 7 x weekly - 3 sets - 10 reps - 5 seconds hold  - Supine Posterior Pelvic Tilt  - 1 x daily - 7 x weekly - 3 sets - 10 reps - 10 seconds hold  - Forward T with Counter Support  - 1 x daily - 7 x weekly - 2-3 sets - 10 reps    SLS with contralateral UE assist and with contralateral LE hip flexion and extension   R 1 minute x 3   ASSESSMENT:  CLINICAL IMPRESSION:  Continued working on glute med strengthening to decrease pelvic drop and lumbar side bend compensation during gait to help decrease low back pain with walking longer distances. Continued working on trunk strengthening to decrease lumbar extension stress. Good muscle use felt with exercises. Pt tolerated session well without aggravation of symptoms.  Pt will benefit from continued skilled physical therapy services to decrease pain, improve strength and function.     OBJECTIVE IMPAIRMENTS: difficulty walking, decreased ROM, decreased strength, improper body mechanics, postural dysfunction, and pain.   ACTIVITY LIMITATIONS: standing and locomotion level  PARTICIPATION LIMITATIONS:   PERSONAL FACTORS: Age, Fitness, Time since onset of injury/illness/exacerbation, and 1-2 comorbidities: Osteopenia, arthritis, skin CA are also affecting patient's functional outcome.   REHAB POTENTIAL: Fair    CLINICAL DECISION MAKING: Stable/uncomplicated  EVALUATION COMPLEXITY: Low   GOALS: Goals reviewed with patient? Yes  SHORT TERM GOALS: Target date: 06/02/2023  Pt will be independent with  her initial HEP to improve posture, strength, function, and ability to perform standing tasks and ambulate longer distances more comfortably for her back.  Baseline: Pt has started her initial HEP. (05/17/2023) Goal status: INITIAL      LONG TERM GOALS: Target date: 07/14/2023  Pt will have a decrease in low back pain to 2/10 or less at worst to promote ability to ambulate longer distances, perform standing tasks, and play tennis more comfortably for her back.  Baseline: L low back pain. 5/10 at worst for the past 3 months  (05/17/2023) Goal status: INITIAL  2.  Pt will improve B hip extension and abduction strength by at least 1/2 MMT grade to promote ability to perform standing tasks as well as ambulate with less low back pain.  Baseline:  MMT Right eval Left eval  Hip extension 4 with lumbar extension compensation 4 with lumbar extension compensation  Hip abduction 4 4   (05/17/2023)   Goal status: INITIAL  3.  Pt will improve B hip IR ROM by at least 10 degrees to promote ability to ambulate longer distances more comfortably for her back.  Baseline:  Passive  Right eval Left eval  Hip internal rotation 22 degrees  18 degrees   (05/17/2023)  Goal status: INITIAL  4.  Pt will improve her Modified Oswestry Low Back Pain disability Questionnaire by at least 10% as a demonstration of improved function.  Baseline: 13/50 (26%) (05/17/2023) Goal status: INITIAL    PLAN:  PT FREQUENCY: 1-2x/week  PT DURATION: 8 weeks  PLANNED INTERVENTIONS: 97110-Therapeutic exercises, 97530- Therapeutic activity, [id number]- Neuromuscular re-education, 97535- Self Care, [id number]- Manual therapy, [id number]- Electrical stimulation (unattended), [phone]- Ionotophoresis 4mg /ml Dexamethasone, Patient/Family education, and Dry Needling.  PLAN FOR NEXT SESSION: Posture, trunk and hip strengthening, hip ROM, manual techniques, modalities PRN   Taegan Haider, PT, DPT 06/09/2023, 10:30 AM

## 2023-06-12 ENCOUNTER — Ambulatory Visit

## 2023-06-12 DIAGNOSIS — M25612 Stiffness of left shoulder, not elsewhere classified: Secondary | ICD-10-CM | POA: Diagnosis not present

## 2023-06-12 DIAGNOSIS — M5459 Other low back pain: Secondary | ICD-10-CM | POA: Diagnosis not present

## 2023-06-12 DIAGNOSIS — M25512 Pain in left shoulder: Secondary | ICD-10-CM | POA: Diagnosis not present

## 2023-06-12 DIAGNOSIS — R293 Abnormal posture: Secondary | ICD-10-CM | POA: Diagnosis not present

## 2023-06-12 DIAGNOSIS — F4323 Adjustment disorder with mixed anxiety and depressed mood: Secondary | ICD-10-CM

## 2023-06-12 NOTE — Therapy (Signed)
 OUTPATIENT PHYSICAL THERAPY TREATMENT   Patient Name: Natalie Sosa MRN: [id number] DOB:02-May-1945, 78 y.o., female Today's Date: 06/12/2023  END OF SESSION:  PT End of Session - 06/12/23 1109     Visit Number 8    Number of Visits 17    Date for PT Re-Evaluation 07/14/23    PT Start Time 1109    PT Stop Time 1151    PT Time Calculation (min) 42 min    Activity Tolerance Patient tolerated treatment well    Behavior During Therapy [facility] for tasks assessed/performed                    Past Medical History:  Diagnosis Date   Arthritis    right knee, right thumb   Bright red rectal bleeding 09/01/2021   Cancer (HCC)    skin   COVID-19 12/31/2020   ENDOMETRIOSIS 10/04/2006   Qualifier: History of  By: Roxine Cordia MD, Candie Chamber    Suprapubic pressure 09/01/2021   Past Surgical History:  Procedure Laterality Date   ACHILLES TENDON REPAIR  06/14/2004   CATARACT EXTRACTION W/PHACO Right 11/09/2022   Procedure: CATARACT EXTRACTION PHACO AND INTRAOCULAR LENS PLACEMENT (IOC) RIGHT TORIC 7.96 00:48.4;  Surgeon: Annell Kidney, MD;  Location: [facility];  Service: Ophthalmology;  Laterality: Right;   CATARACT EXTRACTION W/PHACO Left 12/07/2022   Procedure: CATARACT EXTRACTION PHACO AND INTRAOCULAR LENS PLACEMENT (IOC) LEFT  CLAREON VIVITY TORIC LENS 7.43 00:49.5;  Surgeon: Annell Kidney, MD;  Location: [facility];  Service: Ophthalmology;  Laterality: Left;   CESAREAN SECTION     placenta previa   CHOLECYSTECTOMY  06/13/2001   COLONOSCOPY WITH PROPOFOL  N/A 09/24/2021   Procedure: COLONOSCOPY WITH PROPOFOL ;  Surgeon: Selena Daily, MD;  Location: [facility];  Service: Gastroenterology;  Laterality: N/A;   Patient Active Problem List   Diagnosis Date Noted   Adjustment reaction with anxiety and depression 03/01/2023   Osteoarthritis 02/17/2022   Encounter for annual general medical examination with abnormal findings in adult 02/17/2022    Arthritis of carpometacarpal [facility]) joint of right thumb 12/28/2021   Polyp of cecum    Unilateral primary osteoarthritis, right knee 09/22/2021   Screen for colon cancer 01/11/2016   Insomnia 01/01/2014   HYPERCHOLESTEROLEMIA 10/04/2006    PCP: Gabriel John, NP   REFERRING PROVIDER: Arnie Lao MD  REFERRING DIAG: Low back pain, DDD  Rationale for Evaluation and Treatment: Rehabilitation  THERAPY DIAG:  Other low back pain  ONSET DATE: 5-6 years ago  SUBJECTIVE:  SUBJECTIVE STATEMENT: Low back is no problems right now. Was ok during the last session and during the weekend.        PERTINENT HISTORY:  Low back pain.  Pt was playing tennis 6 years ago. Pt was trying to arch her back to get a better tennis serve. After a while pain worsened. Pt then went to a chiropractor but also rested. Got an x-ray which revealed scoliosis on L side. The pain would take her breath away. Sitting and resting helps. Has been dealing with her back pain ever since. Has not hurt that bad again. Pt also has R dorsal foot numbness, got an MRI for her low back.    No latex allergies No blood pressure problems per pt.  Has osteopenia/osteoporosis   PAIN:  Are you having pain? Yes: NPRS scale: 0/10 Pain location: L low back Pain description: sharp Aggravating factors: Lunging to the R or L while playing tennis; serving at tennis (does not do that anymore), walking about 15 minutes; standing for 30 minutes Relieving factors: sitting for 5 minutes   PRECAUTIONS: Osteopenia per bone density on 04/13/2023  RED FLAGS: Bowel or bladder incontinence: No and Cauda equina syndrome: No   WEIGHT BEARING RESTRICTIONS: No  FALLS:  Has patient fallen in last 6 months? No  LIVING ENVIRONMENT: Lives  with: lives with their spouse Lives in: House/apartment Stairs: No Has following equipment at home: None  OCCUPATION: retired  PLOF: Independent  PATIENT GOALS: Get her muscles strong enough so the degeneration does not get worse.   NEXT MD VISIT: None yet  OBJECTIVE:  Note: Objective measures were completed at Evaluation unless otherwise noted.  DIAGNOSTIC FINDINGS:  MR Lumbar Spine w/o contrast 03/31/2023  Narrative & Impression  CLINICAL DATA:  78 year old female with 4 months of low back pain. Numbness at the top of the right foot.   EXAM: MRI LUMBAR SPINE WITHOUT CONTRAST   TECHNIQUE: Multiplanar, multisequence MR imaging of the lumbar spine was performed. No intravenous contrast was administered.   COMPARISON:  None Available.   FINDINGS: Segmentation: Lumbar segmentation appears to be normal and will be designated as such for this report.   Alignment: Levoconvex lumbar scoliosis is mild-to-moderate, apex at L3-L4. Associated straightening of lumbar lordosis.   Vertebrae: Normal background bone marrow signal. Widespread lumbar degenerative endplate spurring. Mostly chronic degenerative endplate marrow signal changes associated. Faint superimposed endplate marrow edema such as at L2-L3 and L3-L4 on series 109, image 7. Intact visible sacrum and SI joints. Incidental small S2-S3 sacral Tarlov cysts (normal variant).   Conus medullaris and cauda equina: Conus extends to the L1-L2 level. No lower spinal cord or conus signal abnormality. Generally normal cauda equina nerve roots.   Paraspinal and other soft tissues: Negative.   Disc levels:   T11-T12 and   T12-L1:  Negative.   L1-L2: Disc desiccation and disc space loss. Disc bulging asymmetric to the left. No spinal or convincing lateral recess stenosis. Mild mostly left side L1 foraminal stenosis.   L2-L3: Severe disc space loss. Circumferential disc osteophyte complex. Mild facet and ligament flavum  hypertrophy greater on the right. No spinal or convincing lateral recess stenosis. Mild more so than moderate bilateral L2 neural foraminal stenosis.   L3-L4: Similar disc space loss. Circumferential disc bulging and endplate spurring with asymmetric left foraminal component of disc on series 7, image 13. Mild facet and ligament flavum hypertrophy. No spinal or lateral recess stenosis. Moderate left and mild to moderate right L3 neural  foraminal stenosis.   L4-L5: Severe disc space loss. Circumferential disc osteophyte complex. Mild to moderate facet and ligament flavum hypertrophy greater on the left. No significant spinal stenosis. Mild left lateral recess stenosis (descending left L5 nerve level series 115, image 27). Moderate left and moderate to severe right (series 7, image 4) L4 neural foraminal stenosis.   L5-S1: Better preserved disc space. Circumferential disc bulging is asymmetric to the left. Moderate facet and ligament flavum hypertrophy greater on the left. Degenerative left facet joint fluid. No spinal stenosis, but moderate left lateral recess stenosis (descending left L5 nerve level series 115, image 32). Severe left L5 neural foraminal stenosis (series 7, image 11). Only borderline to mild right foraminal stenosis.   IMPRESSION: 1. Advanced lumbar spine disc and endplate degeneration in the setting of mild to moderate levoconvex scoliosis. Faint degenerative mid lumbar endplate marrow edema. 2. No significant lumbar spinal stenosis due to capacious underlying spinal canal. Right side neural impingement maximal at the right L4 neural foramina, multifactorial. Multilevel moderate and occasionally severe (left L5 nerve level) left side lateral recess and foraminal stenosis.     Electronically Signed   By: Marlise Simpers M.D.   On: 04/17/2023 08:34     PATIENT SURVEYS:  Modified Oswestry 13/50 (26%)   COGNITION: Overall cognitive status: Within functional limits  for tasks assessed     SENSATION: WFL  MUSCLE LENGTH:   POSTURE: forward neck, movement preference around C5/C6 area, and C3/C4 area, B protracted shoulders, R shoulder higher, thoracic kyphosis, R lateral shift   R  convexity at thoracolumbar junction L lumbar convexity around L2,3,4 area L lumbar rotation  B genu valgus R > L   Improved low back comfort level with manual posturral correction from PT  Prone position: L posterior pelvic rotation    PALPATION: TTP L SI joint area.  L ASIS more posterior compared to R   LUMBAR ROM:   AROM eval  Flexion WFL  Extension WFL with R lumbar rotation  Right lateral flexion WFL  Left lateral flexion WFL  Right rotation ful  Left rotation ful     Lumbar extension with R rotation WFL  Lumbar extension with L rotation  WFL with slight symptoms   (Blank rows = not tested)   LOWER EXTREMITY ROM:     Passive  Right eval Left eval  Hip flexion    Hip extension    Hip abduction    Hip adduction    Hip internal rotation 22 degrees  18 degrees  Hip external rotation  [facility]  Knee flexion    Knee extension    Ankle dorsiflexion    Ankle plantarflexion    Ankle inversion    Ankle eversion     (Blank rows = not tested)  LOWER EXTREMITY MMT:    MMT Right eval Left eval  Hip flexion 4 4  Hip extension 4 with lumbar extension compensation 4 with lumbar extension compensation  Hip abduction 4 4  Hip adduction    Hip internal rotation    Hip external rotation    Knee flexion 5 5  Knee extension 5 5  Ankle dorsiflexion    Ankle plantarflexion    Ankle inversion    Ankle eversion     (Blank rows = not tested)  LUMBAR SPECIAL TESTS:  (-) repeated flexion test Long sit test suggests posterior nutation of L innominate.    FUNCTIONAL TESTS:    GAIT: Distance walked: 60 ft Assistive device utilized:  None Level of assistance: Complete Independence Comments: decreased stance L LE, B pelvic drop  TREATMENT DATE:  06/12/2023                                                                                                                               Neuromuscular re-education  Quadruped   Hip extension    R 10x5 seconds    L 10x5 seconds   Supine SKTC   R 30 seconds x 3  Supine SLR hip flexion   L 10x3  Crunch 10x2  Then to the R 10x2  Then to the L 10x2  Standing hip machine height  4  Hip extension   R plate 40 for 82N5   L plate 40 for 62Z3   Hip abduction    R plate 25 for 08M5   L plate 25 for 78I6    Sitting with upright posture with L to R pressure from strap at L lumbar convexity  With L trunk side bend stretch 30 seconds x 5   Then with transversus abdominis contraction 10x10 seconds     Slight decrease in L lumbar convexity observed.       Improved exercise technique, movement at target joints, use of target muscles after mod verbal, visual, tactile cues.      PATIENT EDUCATION:  Education details: there-ex, HEP Person educated: Patient Education method: Explanation, Demonstration, Tactile cues, Verbal cues, and Handouts Education comprehension: verbalized understanding and returned demonstration  HOME EXERCISE PROGRAM: Access Code: [email or web address] URL: [email or web address] Date: 05/17/2023 Prepared by: Suzzane Estes  Exercises - Supine Piriformis Stretch with Leg Straight  - 3 x daily - 7 x weekly - 1 sets - 5 reps - 30 seconds  hold - Seated Hip Internal Rotation AROM  - 1 x daily - 7 x weekly - 3 sets - 10 reps - 5 seconds hold - Right Standing Lateral Shift Correction at Wall - Repetitions  - 1 x daily - 7 x weekly - 3 sets - 10 reps - 5 seconds hold  - Standing Hip Abduction with Resistance at Ankles and Counter Support  - 1 x daily - 7 x weekly - 3 sets - 10 reps  Yellow band  Red band upgrade on 06/05/2023  - Supine March with Posterior Pelvic Tilt  - 1 x daily - 7 x weekly - 3 sets - 10 reps  - Supine Lower Trunk Rotation  - 1 x daily -  7 x weekly - 3 sets - 10 reps - 5 seconds hold  - Supine Posterior Pelvic Tilt  - 1 x daily - 7 x weekly - 3 sets - 10 reps - 10 seconds hold  - Forward T with Counter Support  - 1 x daily - 7 x weekly - 2-3 sets - 10 reps    SLS with contralateral UE assist and with contralateral LE hip flexion and extension   R 1 minute x 3  -  Quadruped Bent Leg Hip Extension  - 2 x daily - 7 x weekly - 1 sets - 10 reps - 5 seconds hold   ASSESSMENT:  CLINICAL IMPRESSION:  Continued working trunk and glute strengthening as well as decreasing L lumbar convexity to decrease stress to low back. Good muscle use reported during exercises. No back pain reported throughout session.  Pt will benefit from continued skilled physical therapy services to decrease pain, improve strength and function.     OBJECTIVE IMPAIRMENTS: difficulty walking, decreased ROM, decreased strength, improper body mechanics, postural dysfunction, and pain.   ACTIVITY LIMITATIONS: standing and locomotion level  PARTICIPATION LIMITATIONS:   PERSONAL FACTORS: Age, Fitness, Time since onset of injury/illness/exacerbation, and 1-2 comorbidities: Osteopenia, arthritis, skin CA are also affecting patient's functional outcome.   REHAB POTENTIAL: Fair    CLINICAL DECISION MAKING: Stable/uncomplicated  EVALUATION COMPLEXITY: Low   GOALS: Goals reviewed with patient? Yes  SHORT TERM GOALS: Target date: 06/02/2023  Pt will be independent with her initial HEP to improve posture, strength, function, and ability to perform standing tasks and ambulate longer distances more comfortably for her back.  Baseline: Pt has started her initial HEP. (05/17/2023);  Goal status: INITIAL      LONG TERM GOALS: Target date: 07/14/2023  Pt will have a decrease in low back pain to 2/10 or less at worst to promote ability to ambulate longer distances, perform standing tasks, and play tennis more comfortably for her back.  Baseline: L low back  pain. 5/10 at worst for the past 3 months  (05/17/2023); 4/10 random pain at worst for the past 7 days (06/12/2023)  Goal status: ONGOING  2.  Pt will improve B hip extension and abduction strength by at least 1/2 MMT grade to promote ability to perform standing tasks as well as ambulate with less low back pain.  Baseline:  MMT Right eval Left eval  Hip extension 4 with lumbar extension compensation 4 with lumbar extension compensation  Hip abduction 4 4   (05/17/2023)   Goal status: INITIAL  3.  Pt will improve B hip IR ROM by at least 10 degrees to promote ability to ambulate longer distances more comfortably for her back.  Baseline:  Passive  Right eval Left eval  Hip internal rotation 22 degrees  18 degrees   (05/17/2023)  Goal status: INITIAL  4.  Pt will improve her Modified Oswestry Low Back Pain disability Questionnaire by at least 10% as a demonstration of improved function.  Baseline: 13/50 (26%) (05/17/2023) Goal status: INITIAL    PLAN:  PT FREQUENCY: 1-2x/week  PT DURATION: 8 weeks  PLANNED INTERVENTIONS: 97110-Therapeutic exercises, 97530- Therapeutic activity, [id number]- Neuromuscular re-education, 97535- Self Care, 09811- Manual therapy, G0283- Electrical stimulation (unattended), [phone]- Ionotophoresis 4mg /ml Dexamethasone, Patient/Family education, and Dry Needling.  PLAN FOR NEXT SESSION: Posture, trunk and hip strengthening, hip ROM, manual techniques, modalities PRN   Adlean Hardeman, PT, DPT 06/12/2023, 11:57 AM

## 2023-06-13 MED ORDER — ESCITALOPRAM OXALATE 10 MG PO TABS: 10.0000 mg | ORAL_TABLET | Freq: Every day | ORAL | 0 refills | Status: DC

## 2023-06-13 NOTE — Telephone Encounter (Signed)
 Copied from CRM [id number]. Topic: Clinical - Medication Refill >> Jun 13, 2023  9:49 AM Emmet Harm C wrote: Medication: sertraline  (ZOLOFT ) 25 mg  Has the patient contacted their pharmacy? Yes (Agent: If no, request that the patient contact the pharmacy for the refill. If patient does not wish to contact the pharmacy document the reason why and proceed with request.) (Agent: If yes, when and what did the pharmacy advise?)  This is the patient's preferred pharmacy:  [id number] - [place], [place] - [address] AT [facility] [address] [facility] [address] [place] [address] Phone: [phone] Fax: [phone]  Is this the correct pharmacy for this prescription? Yes If no, delete pharmacy and type the correct one.   Has the prescription been filled recently? No  Is the patient out of the medication? No  Has the patient been seen for an appointment in the last year OR does the patient have an upcoming appointment? Yes  Can we respond through MyChart? No  Agent: Please be advised that Rx refills may take up to 3 business days. We ask that you follow-up with your pharmacy.

## 2023-06-16 ENCOUNTER — Ambulatory Visit

## 2023-06-16 DIAGNOSIS — M5459 Other low back pain: Secondary | ICD-10-CM | POA: Diagnosis not present

## 2023-06-16 DIAGNOSIS — R293 Abnormal posture: Secondary | ICD-10-CM | POA: Diagnosis not present

## 2023-06-16 DIAGNOSIS — M25512 Pain in left shoulder: Secondary | ICD-10-CM | POA: Diagnosis not present

## 2023-06-16 DIAGNOSIS — M25612 Stiffness of left shoulder, not elsewhere classified: Secondary | ICD-10-CM | POA: Diagnosis not present

## 2023-06-16 NOTE — Therapy (Signed)
 OUTPATIENT PHYSICAL THERAPY TREATMENT   Patient Name: AUGUSTINA BRADDOCK MRN: [id number] DOB:[birth year], 78 y.o., female Today's Date: 06/16/2023  END OF SESSION:  PT End of Session - 06/16/23 1037     Visit Number 9    Number of Visits 17    Date for PT Re-Evaluation 07/14/23    Progress Note Due on Visit 10    PT Start Time 1035    PT Stop Time 1115    PT Time Calculation (min) 40 min    Activity Tolerance Patient tolerated treatment well;No increased pain    Behavior During Therapy [facility] for tasks assessed/performed             Past Medical History:  Diagnosis Date   Arthritis    right knee, right thumb   Bright red rectal bleeding 09/01/2021   Cancer (HCC)    skin   COVID-19 12/31/2020   ENDOMETRIOSIS 10/04/2006   Qualifier: History of  By: Roxine Cordia MD, Candie Chamber    Suprapubic pressure 09/01/2021   Past Surgical History:  Procedure Laterality Date   ACHILLES TENDON REPAIR  06/14/2004   CATARACT EXTRACTION W/PHACO Right 11/09/2022   Procedure: CATARACT EXTRACTION PHACO AND INTRAOCULAR LENS PLACEMENT (IOC) RIGHT TORIC 7.96 00:48.4;  Surgeon: Annell Kidney, MD;  Location: [facility];  Service: Ophthalmology;  Laterality: Right;   CATARACT EXTRACTION W/PHACO Left 12/07/2022   Procedure: CATARACT EXTRACTION PHACO AND INTRAOCULAR LENS PLACEMENT (IOC) LEFT  CLAREON VIVITY TORIC LENS 7.43 00:49.5;  Surgeon: Annell Kidney, MD;  Location: [facility];  Service: Ophthalmology;  Laterality: Left;   CESAREAN SECTION     placenta previa   CHOLECYSTECTOMY  06/13/2001   COLONOSCOPY WITH PROPOFOL  N/A 09/24/2021   Procedure: COLONOSCOPY WITH PROPOFOL ;  Surgeon: Selena Daily, MD;  Location: [facility];  Service: Gastroenterology;  Laterality: N/A;   Patient Active Problem List   Diagnosis Date Noted   Adjustment reaction with anxiety and depression 03/01/2023   Osteoarthritis 02/17/2022   Encounter for annual general medical examination with  abnormal findings in adult 02/17/2022   Arthritis of carpometacarpal [facility]) joint of right thumb 12/28/2021   Polyp of cecum    Unilateral primary osteoarthritis, right knee 09/22/2021   Screen for colon cancer 01/11/2016   Insomnia 01/01/2014   HYPERCHOLESTEROLEMIA 10/04/2006    PCP: Gabriel John, NP   REFERRING PROVIDER: Arnie Lao MD  REFERRING DIAG: Low back pain, DDD  Rationale for Evaluation and Treatment: Rehabilitation  THERAPY DIAG:  Other low back pain  ONSET DATE: 5-6 years ago  SUBJECTIVE:  SUBJECTIVE STATEMENT: Low back is no problems right now. Was ok during the last session and during the weekend. Pt has no plans this hoiday weekend, already did the beach thing a couple weeks back.    PERTINENT HISTORY:  Low back pain.  Pt was playing tennis 6 years ago. Pt was trying to arch her back to get a better tennis serve. After a while pain worsened. Pt then went to a chiropractor but also rested. Got an x-ray which revealed scoliosis on L side. The pain would take her breath away. Sitting and resting helps. Has been dealing with her back pain ever since. Has not hurt that bad again. Pt also has R dorsal foot numbness, got an MRI for her low back.    No latex allergies No blood pressure problems per pt.  Has osteopenia/osteoporosis  PAIN:  Are you having pain? Yes: NPRS scale: 0/10 Pain location: L low back Pain description: sharp Aggravating factors: Lunging to the R or L while playing tennis; serving at tennis (does not do that anymore), walking about 15 minutes; standing for 30 minutes Relieving factors: sitting for 5 minutes   PRECAUTIONS: Osteopenia per bone density on 04/13/2023  RED FLAGS: Bowel or bladder incontinence: No and Cauda equina syndrome:  No   WEIGHT BEARING RESTRICTIONS: No  FALLS:  Has patient fallen in last 6 months? No  LIVING ENVIRONMENT: Lives with: lives with their spouse Lives in: House/apartment Stairs: No Has following equipment at home: None  OCCUPATION: retired  PLOF: Independent  PATIENT GOALS: Get her muscles strong enough so the degeneration does not get worse.   NEXT MD VISIT: None yet  OBJECTIVE:  Note: Objective measures were completed at Evaluation unless otherwise noted.  DIAGNOSTIC FINDINGS:  MR Lumbar Spine w/o contrast 03/31/2023  Narrative & Impression  CLINICAL DATA:  78 year old female with 4 months of low back pain. Numbness at the top of the right foot.   EXAM: MRI LUMBAR SPINE WITHOUT CONTRAST   TECHNIQUE: Multiplanar, multisequence MR imaging of the lumbar spine was performed. No intravenous contrast was administered.   COMPARISON:  None Available.   FINDINGS: Segmentation: Lumbar segmentation appears to be normal and will be designated as such for this report.   Alignment: Levoconvex lumbar scoliosis is mild-to-moderate, apex at L3-L4. Associated straightening of lumbar lordosis.   Vertebrae: Normal background bone marrow signal. Widespread lumbar degenerative endplate spurring. Mostly chronic degenerative endplate marrow signal changes associated. Faint superimposed endplate marrow edema such as at L2-L3 and L3-L4 on series 109, image 7. Intact visible sacrum and SI joints. Incidental small S2-S3 sacral Tarlov cysts (normal variant).   Conus medullaris and cauda equina: Conus extends to the L1-L2 level. No lower spinal cord or conus signal abnormality. Generally normal cauda equina nerve roots.   Paraspinal and other soft tissues: Negative.   Disc levels:   T11-T12 and   T12-L1:  Negative.   L1-L2: Disc desiccation and disc space loss. Disc bulging asymmetric to the left. No spinal or convincing lateral recess stenosis. Mild mostly left side L1 foraminal  stenosis.   L2-L3: Severe disc space loss. Circumferential disc osteophyte complex. Mild facet and ligament flavum hypertrophy greater on the right. No spinal or convincing lateral recess stenosis. Mild more so than moderate bilateral L2 neural foraminal stenosis.   L3-L4: Similar disc space loss. Circumferential disc bulging and endplate spurring with asymmetric left foraminal component of disc on series 7, image 13. Mild facet and ligament flavum hypertrophy. No spinal or lateral  recess stenosis. Moderate left and mild to moderate right L3 neural foraminal stenosis.   L4-L5: Severe disc space loss. Circumferential disc osteophyte complex. Mild to moderate facet and ligament flavum hypertrophy greater on the left. No significant spinal stenosis. Mild left lateral recess stenosis (descending left L5 nerve level series 115, image 27). Moderate left and moderate to severe right (series 7, image 4) L4 neural foraminal stenosis.   L5-S1: Better preserved disc space. Circumferential disc bulging is asymmetric to the left. Moderate facet and ligament flavum hypertrophy greater on the left. Degenerative left facet joint fluid. No spinal stenosis, but moderate left lateral recess stenosis (descending left L5 nerve level series 115, image 32). Severe left L5 neural foraminal stenosis (series 7, image 11). Only borderline to mild right foraminal stenosis.   IMPRESSION: 1. Advanced lumbar spine disc and endplate degeneration in the setting of mild to moderate levoconvex scoliosis. Faint degenerative mid lumbar endplate marrow edema. 2. No significant lumbar spinal stenosis due to capacious underlying spinal canal. Right side neural impingement maximal at the right L4 neural foramina, multifactorial. Multilevel moderate and occasionally severe (left L5 nerve level) left side lateral recess and foraminal stenosis.     Electronically Signed   By: Marlise Simpers M.D.   On: 04/17/2023 08:34    PATIENT SURVEYS:  Modified Oswestry 13/50 (26%)   COGNITION: Overall cognitive status: Within functional limits for tasks assessed     SENSATION: WFL  MUSCLE LENGTH:   POSTURE: forward neck, movement preference around C5/C6 area, and C3/C4 area, B protracted shoulders, R shoulder higher, thoracic kyphosis, R lateral shift   R  convexity at thoracolumbar junction L lumbar convexity around L2,3,4 area L lumbar rotation  B genu valgus R > L   Improved low back comfort level with manual posturral correction from PT  Prone position: L posterior pelvic rotation    PALPATION: TTP L SI joint area.  L ASIS more posterior compared to R  LUMBAR ROM:   AROM eval  Flexion WFL  Extension WFL with R lumbar rotation  Right lateral flexion WFL  Left lateral flexion WFL  Right rotation ful  Left rotation ful     Lumbar extension with R rotation WFL  Lumbar extension with L rotation  WFL with slight symptoms   (Blank rows = not tested)   LOWER EXTREMITY ROM:     Passive  Right eval Left eval  Hip flexion    Hip extension    Hip abduction    Hip adduction    Hip internal rotation 22 degrees  18 degrees  Hip external rotation  [facility] [facility]  Knee flexion    Knee extension    Ankle dorsiflexion    Ankle plantarflexion    Ankle inversion    Ankle eversion     (Blank rows = not tested)  LOWER EXTREMITY MMT:    MMT Right eval Left eval  Hip flexion 4 4  Hip extension 4 with lumbar extension compensation 4 with lumbar extension compensation  Hip abduction 4 4  Hip adduction    Hip internal rotation    Hip external rotation    Knee flexion 5 5  Knee extension 5 5   (Blank rows = not tested)  LUMBAR SPECIAL TESTS:  (-) repeated flexion test Long sit test suggests posterior nutation of L innominate.   GAIT: Distance walked: 60 ft Assistive device utilized: None Level of assistance: Complete Independence Comments: decreased stance L LE, B pelvic drop  Intervention  06/16/23 -  Standing hip machine height  4             Hip extension                         R plate 40 for 11B1                         L plate 40 for 47W2              Hip abduction                          R plate 40 for 95A2                         L plate 40 for 13Y8 -crunches x10, reverse curlup x10 (blue ball between knees)  -hooklying cervical retraction + isometric bilat shoulder extension into plinth 10x5secH (in response to pt's concerns regarding recent bone density testing) -prone hip extension, trunk supported on elbows on high plinth 1x10 bilat  -lateral side stepping c red TB 1x34ft bilat  -prone hip extension, trunk supported on elbows on high plinth 1x10 bilat  -lateral tennis shuffle with minGuard assist 2x62ft  -backward shuffle 2x47ft minGuard assist -SLS balance on practice 10x10sec bilat, alternating on treadmill belt with 2 rails as needed.    PATIENT EDUCATION:  Education details: there-ex, HEP Person educated: Patient Education method: Explanation, Demonstration, Tactile cues, Verbal cues, and Handouts Education comprehension: verbalized understanding and returned demonstration  HOME EXERCISE PROGRAM: Access Code: [email or web address] URL: [email or web address] Date: 05/17/2023 Prepared by: Suzzane Estes  Exercises - Supine Piriformis Stretch with Leg Straight  - 3 x daily - 7 x weekly - 1 sets - 5 reps - 30 seconds  hold - Seated Hip Internal Rotation AROM  - 1 x daily - 7 x weekly - 3 sets - 10 reps - 5 seconds hold - Right Standing Lateral Shift Correction at Wall - Repetitions  - 1 x daily - 7 x weekly - 3 sets - 10 reps - 5 seconds hold  - Standing Hip Abduction with Resistance at Ankles and Counter Support  - 1 x daily - 7 x weekly - 3 sets - 10 reps  Yellow band  Red band upgrade on 06/05/2023 - Supine March with Posterior Pelvic Tilt  - 1 x daily - 7 x weekly - 3 sets - 10 reps - Supine Lower Trunk Rotation  - 1 x daily - 7 x weekly - 3 sets - 10  reps - 5 seconds hold - Supine Posterior Pelvic Tilt  - 1 x daily - 7 x weekly - 3 sets - 10 reps - 10 seconds hold - Forward T with Counter Support  - 1 x daily - 7 x weekly - 2-3 sets - 10 reps -SLS with contralateral UE assist and with contralateral LE hip flexion and extension   R 1 minute x 3 - Quadruped Bent Leg Hip Extension  - 2 x daily - 7 x weekly - 1 sets - 10 reps - 5 seconds hold   ASSESSMENT:  CLINICAL IMPRESSION: Continued with POC and recent program updates pt feeling good in general. No significant fatigue noted in session. HEP remains appropriate, no updates made. Able to integrate some of her tennis based movements into session, given her higher functional status. Also wanted to keep her engaged with  these high level baseline balance status as she indicates not being able to tolerate tennis play as much as she used to. Pt will benefit from continued skilled physical therapy services to decrease pain, improve strength and function.    OBJECTIVE IMPAIRMENTS: difficulty walking, decreased ROM, decreased strength, improper body mechanics, postural dysfunction, and pain.   ACTIVITY LIMITATIONS: standing and locomotion level  PARTICIPATION LIMITATIONS:   PERSONAL FACTORS: Age, Fitness, Time since onset of injury/illness/exacerbation, and 1-2 comorbidities: Osteopenia, arthritis, skin CA are also affecting patient's functional outcome.   REHAB POTENTIAL: Fair    CLINICAL DECISION MAKING: Stable/uncomplicated  EVALUATION COMPLEXITY: Low  GOALS: Goals reviewed with patient? Yes  SHORT TERM GOALS: Target date: 06/02/2023  Pt will be independent with her initial HEP to improve posture, strength, function, and ability to perform standing tasks and ambulate longer distances more comfortably for her back.  Baseline: Pt has started her initial HEP. (05/17/2023);  Goal status: INITIAL  LONG TERM GOALS: Target date: 07/14/2023  Pt will have a decrease in low back pain to 2/10 or  less at worst to promote ability to ambulate longer distances, perform standing tasks, and play tennis more comfortably for her back.  Baseline: L low back pain. 5/10 at worst for the past 3 months  (05/17/2023); 4/10 random pain at worst for the past 7 days (06/12/2023) Goal status: ONGOING  2.  Pt will improve B hip extension and abduction strength by at least 1/2 MMT grade to promote ability to perform standing tasks as well as ambulate with less low back pain.  Baseline:  MMT Right eval Left eval  Hip extension 4 with lumbar extension compensation 4 with lumbar extension compensation  Hip abduction 4 4   (05/17/2023) Goal status: INITIAL  3.  Pt will improve B hip IR ROM by at least 10 degrees to promote ability to ambulate longer distances more comfortably for her back.  Baseline:  Passive  Right eval Left eval  Hip internal rotation 22 degrees  18 degrees   (05/17/2023)  Goal status: INITIAL  4.  Pt will improve her Modified Oswestry Low Back Pain disability Questionnaire by at least 10% as a demonstration of improved function.  Baseline: 13/50 (26%) (05/17/2023) Goal status: INITIAL  PLAN:  PT FREQUENCY: 1-2x/week  PT DURATION: 8 weeks  PLANNED INTERVENTIONS: 97110-Therapeutic exercises, 97530- Therapeutic activity, [id number]- Neuromuscular re-education, 97535- Self Care, 16109- Manual therapy, G0283- Electrical stimulation (unattended), [phone]- Ionotophoresis 4mg /ml Dexamethasone, Patient/Family education, and Dry Needling.  PLAN FOR NEXT SESSION: Posture, trunk and hip strengthening, hip ROM, manual techniques, modalities PRN   Peter Keyworth C, PT, DPT 06/16/2023, 10:40 AM  10:40 AM, 06/16/23 Dawn Eth, PT, DPT Physical Therapist - [place] [phone] (Office)

## 2023-06-20 ENCOUNTER — Ambulatory Visit

## 2023-06-20 DIAGNOSIS — M5459 Other low back pain: Secondary | ICD-10-CM

## 2023-06-20 DIAGNOSIS — M25512 Pain in left shoulder: Secondary | ICD-10-CM | POA: Diagnosis not present

## 2023-06-20 DIAGNOSIS — M25612 Stiffness of left shoulder, not elsewhere classified: Secondary | ICD-10-CM | POA: Diagnosis not present

## 2023-06-20 DIAGNOSIS — R293 Abnormal posture: Secondary | ICD-10-CM | POA: Diagnosis not present

## 2023-06-20 NOTE — Therapy (Signed)
 OUTPATIENT PHYSICAL THERAPY TREATMENT And Progress Report (05/17/2023 - 06/20/2023)   Patient Name: Natalie Sosa MRN: [id number] DOB:1945-09-29, 78 y.o., female Today's Date: 06/20/2023  END OF SESSION:  PT End of Session - 06/20/23 0906     Visit Number 10    Number of Visits 17    Date for PT Re-Evaluation 07/14/23    Progress Note Due on Visit 10    PT Start Time 0906    PT Stop Time 0948    PT Time Calculation (min) 42 min    Activity Tolerance Patient tolerated treatment well;No increased pain    Behavior During Therapy [place] for tasks assessed/performed                     Past Medical History:  Diagnosis Date   Arthritis    right knee, right thumb   Bright red rectal bleeding 09/01/2021   Cancer (HCC)    skin   COVID-19 12/31/2020   ENDOMETRIOSIS 10/04/2006   Qualifier: History of  By: Roxine Cordia MD, Candie Chamber    Suprapubic pressure 09/01/2021   Past Surgical History:  Procedure Laterality Date   ACHILLES TENDON REPAIR  06/14/2004   CATARACT EXTRACTION W/PHACO Right 11/09/2022   Procedure: CATARACT EXTRACTION PHACO AND INTRAOCULAR LENS PLACEMENT (IOC) RIGHT TORIC 7.96 00:48.4;  Surgeon: Annell Kidney, MD;  Location: [facility];  Service: Ophthalmology;  Laterality: Right;   CATARACT EXTRACTION W/PHACO Left 12/07/2022   Procedure: CATARACT EXTRACTION PHACO AND INTRAOCULAR LENS PLACEMENT (IOC) LEFT  CLAREON VIVITY TORIC LENS 7.43 00:49.5;  Surgeon: Annell Kidney, MD;  Location: [facility];  Service: Ophthalmology;  Laterality: Left;   CESAREAN SECTION     placenta previa   CHOLECYSTECTOMY  06/13/2001   COLONOSCOPY WITH PROPOFOL  N/A 09/24/2021   Procedure: COLONOSCOPY WITH PROPOFOL ;  Surgeon: Selena Daily, MD;  Location: [facility];  Service: Gastroenterology;  Laterality: N/A;   Patient Active Problem List   Diagnosis Date Noted   Adjustment reaction with anxiety and depression 03/01/2023   Osteoarthritis  02/17/2022   Encounter for annual general medical examination with abnormal findings in adult 02/17/2022   Arthritis of carpometacarpal [facility]) joint of right thumb 12/28/2021   Polyp of cecum    Unilateral primary osteoarthritis, right knee 09/22/2021   Screen for colon cancer 01/11/2016   Insomnia 01/01/2014   HYPERCHOLESTEROLEMIA 10/04/2006    PCP: Gabriel John, NP   REFERRING PROVIDER: Arnie Lao MD  REFERRING DIAG: Low back pain, DDD  Rationale for Evaluation and Treatment: Rehabilitation  THERAPY DIAG:  Other low back pain  ONSET DATE: 5-6 years ago  SUBJECTIVE:  SUBJECTIVE STATEMENT: Low back is pretty good right now. Was fine during the weekend. Walked down a street where L LE was lower. L low back bothered her initially but then eased off.  Has not yet played tennis. Scheduled tennis for this Thursday.        PERTINENT HISTORY:  Low back pain.  Pt was playing tennis 6 years ago. Pt was trying to arch her back to get a better tennis serve. After a while pain worsened. Pt then went to a chiropractor but also rested. Got an x-ray which revealed scoliosis on L side. The pain would take her breath away. Sitting and resting helps. Has been dealing with her back pain ever since. Has not hurt that bad again. Pt also has R dorsal foot numbness, got an MRI for her low back.    No latex allergies No blood pressure problems per pt.  Has osteopenia/osteoporosis   PAIN:  Are you having pain? Yes: NPRS scale: 0/10 Pain location: L low back Pain description: sharp Aggravating factors: Lunging to the R or L while playing tennis; serving at tennis (does not do that anymore), walking about 15 minutes; standing for 30 minutes Relieving factors: sitting for 5 minutes    PRECAUTIONS: Osteopenia per bone density on 04/13/2023  RED FLAGS: Bowel or bladder incontinence: No and Cauda equina syndrome: No   WEIGHT BEARING RESTRICTIONS: No  FALLS:  Has patient fallen in last 6 months? No  LIVING ENVIRONMENT: Lives with: lives with their spouse Lives in: House/apartment Stairs: No Has following equipment at home: None  OCCUPATION: retired  PLOF: Independent  PATIENT GOALS: Get her muscles strong enough so the degeneration does not get worse.   NEXT MD VISIT: None yet  OBJECTIVE:  Note: Objective measures were completed at Evaluation unless otherwise noted.  DIAGNOSTIC FINDINGS:  MR Lumbar Spine w/o contrast 03/31/2023  Narrative & Impression  CLINICAL DATA:  78 year old female with 4 months of low back pain. Numbness at the top of the right foot.   EXAM: MRI LUMBAR SPINE WITHOUT CONTRAST   TECHNIQUE: Multiplanar, multisequence MR imaging of the lumbar spine was performed. No intravenous contrast was administered.   COMPARISON:  None Available.   FINDINGS: Segmentation: Lumbar segmentation appears to be normal and will be designated as such for this report.   Alignment: Levoconvex lumbar scoliosis is mild-to-moderate, apex at L3-L4. Associated straightening of lumbar lordosis.   Vertebrae: Normal background bone marrow signal. Widespread lumbar degenerative endplate spurring. Mostly chronic degenerative endplate marrow signal changes associated. Faint superimposed endplate marrow edema such as at L2-L3 and L3-L4 on series 109, image 7. Intact visible sacrum and SI joints. Incidental small S2-S3 sacral Tarlov cysts (normal variant).   Conus medullaris and cauda equina: Conus extends to the L1-L2 level. No lower spinal cord or conus signal abnormality. Generally normal cauda equina nerve roots.   Paraspinal and other soft tissues: Negative.   Disc levels:   T11-T12 and   T12-L1:  Negative.   L1-L2: Disc desiccation and disc  space loss. Disc bulging asymmetric to the left. No spinal or convincing lateral recess stenosis. Mild mostly left side L1 foraminal stenosis.   L2-L3: Severe disc space loss. Circumferential disc osteophyte complex. Mild facet and ligament flavum hypertrophy greater on the right. No spinal or convincing lateral recess stenosis. Mild more so than moderate bilateral L2 neural foraminal stenosis.   L3-L4: Similar disc space loss. Circumferential disc bulging and endplate spurring with asymmetric left foraminal component of disc on  series 7, image 13. Mild facet and ligament flavum hypertrophy. No spinal or lateral recess stenosis. Moderate left and mild to moderate right L3 neural foraminal stenosis.   L4-L5: Severe disc space loss. Circumferential disc osteophyte complex. Mild to moderate facet and ligament flavum hypertrophy greater on the left. No significant spinal stenosis. Mild left lateral recess stenosis (descending left L5 nerve level series 115, image 27). Moderate left and moderate to severe right (series 7, image 4) L4 neural foraminal stenosis.   L5-S1: Better preserved disc space. Circumferential disc bulging is asymmetric to the left. Moderate facet and ligament flavum hypertrophy greater on the left. Degenerative left facet joint fluid. No spinal stenosis, but moderate left lateral recess stenosis (descending left L5 nerve level series 115, image 32). Severe left L5 neural foraminal stenosis (series 7, image 11). Only borderline to mild right foraminal stenosis.   IMPRESSION: 1. Advanced lumbar spine disc and endplate degeneration in the setting of mild to moderate levoconvex scoliosis. Faint degenerative mid lumbar endplate marrow edema. 2. No significant lumbar spinal stenosis due to capacious underlying spinal canal. Right side neural impingement maximal at the right L4 neural foramina, multifactorial. Multilevel moderate and occasionally severe (left L5 nerve  level) left side lateral recess and foraminal stenosis.     Electronically Signed   By: Marlise Simpers M.D.   On: 04/17/2023 08:34     PATIENT SURVEYS:  Modified Oswestry 13/50 (26%)   COGNITION: Overall cognitive status: Within functional limits for tasks assessed     SENSATION: WFL  MUSCLE LENGTH:   POSTURE: forward neck, movement preference around C5/C6 area, and C3/C4 area, B protracted shoulders, R shoulder higher, thoracic kyphosis, R lateral shift   R  convexity at thoracolumbar junction L lumbar convexity around L2,3,4 area L lumbar rotation  B genu valgus R > L   Improved low back comfort level with manual posturral correction from PT  Prone position: L posterior pelvic rotation    PALPATION: TTP L SI joint area.  L ASIS more posterior compared to R   LUMBAR ROM:   AROM eval  Flexion WFL  Extension WFL with R lumbar rotation  Right lateral flexion WFL  Left lateral flexion WFL  Right rotation ful  Left rotation ful     Lumbar extension with R rotation WFL  Lumbar extension with L rotation  WFL with slight symptoms   (Blank rows = not tested)   LOWER EXTREMITY ROM:     Passive  Right eval Left eval  Hip flexion    Hip extension    Hip abduction    Hip adduction    Hip internal rotation 22 degrees  18 degrees  Hip external rotation  [facility]  Knee flexion    Knee extension    Ankle dorsiflexion    Ankle plantarflexion    Ankle inversion    Ankle eversion     (Blank rows = not tested)  LOWER EXTREMITY MMT:    MMT Right eval Left eval  Hip flexion 4 4  Hip extension 4 with lumbar extension compensation 4 with lumbar extension compensation  Hip abduction 4 4  Hip adduction    Hip internal rotation    Hip external rotation    Knee flexion 5 5  Knee extension 5 5  Ankle dorsiflexion    Ankle plantarflexion    Ankle inversion    Ankle eversion     (Blank rows = not tested)  LUMBAR SPECIAL TESTS:  (-) repeated flexion test  Long sit  test suggests posterior nutation of L innominate.    FUNCTIONAL TESTS:    GAIT: Distance walked: 60 ft Assistive device utilized: None Level of assistance: Complete Independence Comments: decreased stance L LE, B pelvic drop  TREATMENT DATE: 06/20/2023                                                                                                                               Therapeutic Exercise  Manually resisted S/L hip abduction and prone hip extension 1-2x each way  Hip IR PROM at 90/90 1x each hip  Reviewed progress/current status with PT towards goals   Standing hip machine height  4             Hip extension                         R plate 40 for 09W1                         L plate 40 for 19J4              Hip abduction                          R plate 40 for 78G9                         L plate 40 for 56O1.  Crunch 10x  Then to the R 10x  Then to the L 10x  Reverse crunch 10x   Lateral side stepping c green TB 2x30 ft  bilat   SLS with contralateral UE assist PRN  R 10x10 seconds   L 10x10 seconds    Improved exercise technique, movement at target joints, use of target muscles after mod verbal, visual, tactile cues.      PATIENT EDUCATION:  Education details: there-ex, HEP Person educated: Patient Education method: Explanation, Demonstration, Tactile cues, Verbal cues, and Handouts Education comprehension: verbalized understanding and returned demonstration  HOME EXERCISE PROGRAM: Access Code: [email or web address] URL: [email or web address] Date: 05/17/2023 Prepared by: Suzzane Estes  Exercises - Supine Piriformis Stretch with Leg Straight  - 3 x daily - 7 x weekly - 1 sets - 5 reps - 30 seconds  hold - Seated Hip Internal Rotation AROM  - 1 x daily - 7 x weekly - 3 sets - 10 reps - 5 seconds hold - Right Standing Lateral Shift Correction at Wall - Repetitions  - 1 x daily - 7 x weekly - 3 sets - 10 reps - 5 seconds hold  - Standing Hip  Abduction with Resistance at Ankles and Counter Support  - 1 x daily - 7 x weekly - 3 sets - 10 reps  Yellow band  Red band upgrade on 06/05/2023  - Supine March with Posterior Pelvic Tilt  -  1 x daily - 7 x weekly - 3 sets - 10 reps  - Supine Lower Trunk Rotation  - 1 x daily - 7 x weekly - 3 sets - 10 reps - 5 seconds hold  - Supine Posterior Pelvic Tilt  - 1 x daily - 7 x weekly - 3 sets - 10 reps - 10 seconds hold  - Forward T with Counter Support  - 1 x daily - 7 x weekly - 2-3 sets - 10 reps    SLS with contralateral UE assist and with contralateral LE hip flexion and extension   R 1 minute x 3  - Quadruped Bent Leg Hip Extension  - 2 x daily - 7 x weekly - 1 sets - 10 reps - 5 seconds hold   ASSESSMENT:  CLINICAL IMPRESSION: Pt demonstrates overall improved low back pain, improved hip IR ROM, hip abduction strength and function since initial evaluation. Pt making progress with PT towards goals. Continued working trunk and glute strengthening to decrease stress to low back. Good muscle use reported during exercises. Pt tolerated session well without aggravation of symptoms. Pt will benefit from continued skilled physical therapy services to decrease pain, improve strength and function.     OBJECTIVE IMPAIRMENTS: difficulty walking, decreased ROM, decreased strength, improper body mechanics, postural dysfunction, and pain.   ACTIVITY LIMITATIONS: standing and locomotion level  PARTICIPATION LIMITATIONS:   PERSONAL FACTORS: Age, Fitness, Time since onset of injury/illness/exacerbation, and 1-2 comorbidities: Osteopenia, arthritis, skin CA are also affecting patient's functional outcome.   REHAB POTENTIAL: Fair    CLINICAL DECISION MAKING: Stable/uncomplicated  EVALUATION COMPLEXITY: Low   GOALS: Goals reviewed with patient? Yes  SHORT TERM GOALS: Target date: 06/02/2023  Pt will be independent with her initial HEP to improve posture, strength, function, and ability to  perform standing tasks and ambulate longer distances more comfortably for her back.  Baseline: Pt has started her initial HEP. (05/17/2023); No questions with her HEP (06/20/2023) Goal status: MET      LONG TERM GOALS: Target date: 07/14/2023  Pt will have a decrease in low back pain to 2/10 or less at worst to promote ability to ambulate longer distances, perform standing tasks, and play tennis more comfortably for her back.  Baseline: L low back pain. 5/10 at worst for the past 3 months  (05/17/2023); 4/10 random pain at worst for the past 7 days (06/12/2023); 3-4/10 random back pain. (06/20/2023)  Goal status: ONGOING  2.  Pt will improve B hip extension and abduction strength by at least 1/2 MMT grade to promote ability to perform standing tasks as well as ambulate with less low back pain.  Baseline:  MMT Right eval Left eval R ( 06/20/2023) L (06/20/2023)  Hip extension 4 with lumbar extension compensation 4 with lumbar extension compensation 4 4  Hip abduction 4 4 4+ 4+   (05/17/2023)   Goal status: PARTIALLY MET  3.  Pt will improve B hip IR ROM by at least 10 degrees to promote ability to ambulate longer distances more comfortably for her back.  Baseline:  Passive  Right eval Left eval R (06/20/2023) L (06/20/2023)  Hip internal rotation 22 degrees  18 degrees 28 degrees  30 degrees   (05/17/2023)  Goal status: PARTIALLY MET   4.  Pt will improve her Modified Oswestry Low Back Pain disability Questionnaire by at least 10% as a demonstration of improved function.  Baseline: 13/50 (26%) (05/17/2023); 8/50 (16%) (06/20/2023) Goal status: PARTIALLY MET  PLAN:  PT FREQUENCY: 1-2x/week  PT DURATION: 8 weeks  PLANNED INTERVENTIONS: 97110-Therapeutic exercises, 97530- Therapeutic activity, [id number]- Neuromuscular re-education, 97535- Self Care, 16109- Manual therapy, [id number]- Electrical stimulation (unattended), [phone]- Ionotophoresis 4mg /ml Dexamethasone, Patient/Family  education, and Dry Needling.  PLAN FOR NEXT SESSION: Posture, trunk and hip strengthening, hip ROM, manual techniques, modalities PRN  Thank you for your referral.  Connelly Spruell, PT, DPT 06/20/2023, 1:00 PM

## 2023-06-23 ENCOUNTER — Ambulatory Visit

## 2023-06-23 DIAGNOSIS — R293 Abnormal posture: Secondary | ICD-10-CM

## 2023-06-23 DIAGNOSIS — M25512 Pain in left shoulder: Secondary | ICD-10-CM | POA: Diagnosis not present

## 2023-06-23 DIAGNOSIS — M5459 Other low back pain: Secondary | ICD-10-CM | POA: Diagnosis not present

## 2023-06-23 DIAGNOSIS — M25612 Stiffness of left shoulder, not elsewhere classified: Secondary | ICD-10-CM | POA: Diagnosis not present

## 2023-06-23 NOTE — Therapy (Signed)
 OUTPATIENT PHYSICAL THERAPY TREATMENT  Patient Name: Natalie Sosa MRN: [id number] DOB:May 19, 1945, 78 y.o., female Today's Date: 06/23/2023  END OF SESSION:  PT End of Session - 06/23/23 0948     Visit Number 11    Number of Visits 17    Date for PT Re-Evaluation 07/14/23    Progress Note Due on Visit 20    PT Start Time 0946    PT Stop Time 1026    PT Time Calculation (min) 40 min    Activity Tolerance Patient tolerated treatment well;No increased pain    Behavior During Therapy [facility] for tasks assessed/performed                     Past Medical History:  Diagnosis Date   Arthritis    right knee, right thumb   Bright red rectal bleeding 09/01/2021   Cancer (HCC)    skin   COVID-19 12/31/2020   ENDOMETRIOSIS 10/04/2006   Qualifier: History of  By: Roxine Cordia MD, Candie Chamber    Suprapubic pressure 09/01/2021   Past Surgical History:  Procedure Laterality Date   ACHILLES TENDON REPAIR  06/14/2004   CATARACT EXTRACTION W/PHACO Right 11/09/2022   Procedure: CATARACT EXTRACTION PHACO AND INTRAOCULAR LENS PLACEMENT (IOC) RIGHT TORIC 7.96 00:48.4;  Surgeon: Annell Kidney, MD;  Location: [facility];  Service: Ophthalmology;  Laterality: Right;   CATARACT EXTRACTION W/PHACO Left 12/07/2022   Procedure: CATARACT EXTRACTION PHACO AND INTRAOCULAR LENS PLACEMENT (IOC) LEFT  CLAREON VIVITY TORIC LENS 7.43 00:49.5;  Surgeon: Annell Kidney, MD;  Location: [facility];  Service: Ophthalmology;  Laterality: Left;   CESAREAN SECTION     placenta previa   CHOLECYSTECTOMY  06/13/2001   COLONOSCOPY WITH PROPOFOL  N/A 09/24/2021   Procedure: COLONOSCOPY WITH PROPOFOL ;  Surgeon: Selena Daily, MD;  Location: [facility];  Service: Gastroenterology;  Laterality: N/A;   Patient Active Problem List   Diagnosis Date Noted   Adjustment reaction with anxiety and depression 03/01/2023   Osteoarthritis 02/17/2022   Encounter for annual general medical  examination with abnormal findings in adult 02/17/2022   Arthritis of carpometacarpal [facility]) joint of right thumb 12/28/2021   Polyp of cecum    Unilateral primary osteoarthritis, right knee 09/22/2021   Screen for colon cancer 01/11/2016   Insomnia 01/01/2014   HYPERCHOLESTEROLEMIA 10/04/2006    PCP: Gabriel John, NP   REFERRING PROVIDER: Arnie Lao MD  REFERRING DIAG: Low back pain, DDD  Rationale for Evaluation and Treatment: Rehabilitation  THERAPY DIAG:  Other low back pain  Left shoulder pain, unspecified chronicity  Stiffness of left shoulder, not elsewhere classified  Abnormal posture  ONSET DATE: 5-6 years ago  SUBJECTIVE:  SUBJECTIVE STATEMENT: Played doubles tennis yesterday, back pain at 1 hour, then serious discomfort at 75 minutes. Took longer to resolve, but does not hurt this morning.   PERTINENT HISTORY:  Low back pain.  Pt was playing tennis 6 years ago. Pt was trying to arch her back to get a better tennis serve. After a while pain worsened. Pt then went to a chiropractor but also rested. Got an x-ray which revealed scoliosis on L side. The pain would take her breath away. Sitting and resting helps. Has been dealing with her back pain ever since. Has not hurt that bad again. Pt also has R dorsal foot numbness, got an MRI for her low back.   No latex allergies No blood pressure problems per pt.  Has osteopenia/osteoporosis PAIN:  Are you having pain? No pain today   PRECAUTIONS: Osteopenia per bone density on 04/13/2023  RED FLAGS: Bowel or bladder incontinence: No and Cauda equina syndrome: No   WEIGHT BEARING RESTRICTIONS: No  FALLS:  Has patient fallen in last 6 months? No  LIVING ENVIRONMENT: Lives with: lives with their spouse Lives in:  House/apartment Stairs: No Has following equipment at home: None  OCCUPATION: retired  PLOF: Independent  PATIENT GOALS: Get her muscles strong enough so the degeneration does not get worse.   NEXT MD VISIT: None yet  OBJECTIVE:  Note: Objective measures were completed at Evaluation unless otherwise noted.  DIAGNOSTIC FINDINGS:  MR Lumbar Spine w/o contrast 03/31/2023  Narrative & Impression  CLINICAL DATA:  78 year old female with 4 months of low back pain. Numbness at the top of the right foot.   EXAM: MRI LUMBAR SPINE WITHOUT CONTRAST   TECHNIQUE: Multiplanar, multisequence MR imaging of the lumbar spine was performed. No intravenous contrast was administered.   COMPARISON:  None Available.   FINDINGS: Segmentation: Lumbar segmentation appears to be normal and will be designated as such for this report.   Alignment: Levoconvex lumbar scoliosis is mild-to-moderate, apex at L3-L4. Associated straightening of lumbar lordosis.   Vertebrae: Normal background bone marrow signal. Widespread lumbar degenerative endplate spurring. Mostly chronic degenerative endplate marrow signal changes associated. Faint superimposed endplate marrow edema such as at L2-L3 and L3-L4 on series 109, image 7. Intact visible sacrum and SI joints. Incidental small S2-S3 sacral Tarlov cysts (normal variant).   Conus medullaris and cauda equina: Conus extends to the L1-L2 level. No lower spinal cord or conus signal abnormality. Generally normal cauda equina nerve roots.   Paraspinal and other soft tissues: Negative.   Disc levels:   T11-T12 and   T12-L1:  Negative.   L1-L2: Disc desiccation and disc space loss. Disc bulging asymmetric to the left. No spinal or convincing lateral recess stenosis. Mild mostly left side L1 foraminal stenosis.   L2-L3: Severe disc space loss. Circumferential disc osteophyte complex. Mild facet and ligament flavum hypertrophy greater on the right. No  spinal or convincing lateral recess stenosis. Mild more so than moderate bilateral L2 neural foraminal stenosis.   L3-L4: Similar disc space loss. Circumferential disc bulging and endplate spurring with asymmetric left foraminal component of disc on series 7, image 13. Mild facet and ligament flavum hypertrophy. No spinal or lateral recess stenosis. Moderate left and mild to moderate right L3 neural foraminal stenosis.   L4-L5: Severe disc space loss. Circumferential disc osteophyte complex. Mild to moderate facet and ligament flavum hypertrophy greater on the left. No significant spinal stenosis. Mild left lateral recess stenosis (descending left L5 nerve level series 115, image  27). Moderate left and moderate to severe right (series 7, image 4) L4 neural foraminal stenosis.   L5-S1: Better preserved disc space. Circumferential disc bulging is asymmetric to the left. Moderate facet and ligament flavum hypertrophy greater on the left. Degenerative left facet joint fluid. No spinal stenosis, but moderate left lateral recess stenosis (descending left L5 nerve level series 115, image 32). Severe left L5 neural foraminal stenosis (series 7, image 11). Only borderline to mild right foraminal stenosis.   IMPRESSION: 1. Advanced lumbar spine disc and endplate degeneration in the setting of mild to moderate levoconvex scoliosis. Faint degenerative mid lumbar endplate marrow edema. 2. No significant lumbar spinal stenosis due to capacious underlying spinal canal. Right side neural impingement maximal at the right L4 neural foramina, multifactorial. Multilevel moderate and occasionally severe (left L5 nerve level) left side lateral recess and foraminal stenosis.     Electronically Signed   By: Marlise Simpers M.D.   On: 04/17/2023 08:34   PATIENT SURVEYS:  Modified Oswestry 13/50 (26%)   COGNITION: Overall cognitive status: Within functional limits for tasks  assessed     SENSATION: WFL  POSTURE: forward neck, movement preference around C5/C6 area, and C3/C4 area, B protracted shoulders, R shoulder higher, thoracic kyphosis, R lateral shift   R  convexity at thoracolumbar junction L lumbar convexity around L2,3,4 area L lumbar rotation  B genu valgus R > L  Improved low back comfort level with manual posturral correction from PT Prone position: L posterior pelvic rotation   PALPATION: TTP L SI joint area.  L ASIS more posterior compared to R  LUMBAR ROM:   AROM eval  Flexion WFL  Extension WFL with R lumbar rotation  Right lateral flexion WFL  Left lateral flexion WFL  Right rotation ful  Left rotation ful     Lumbar extension with R rotation WFL  Lumbar extension with L rotation  WFL with slight symptoms   (Blank rows = not tested)   LOWER EXTREMITY ROM:     Passive  Right eval Left eval  Hip flexion    Hip extension    Hip abduction    Hip adduction    Hip internal rotation 22 degrees  18 degrees  Hip external rotation  [facility]  Knee flexion    Knee extension     (Blank rows = not tested)  LOWER EXTREMITY MMT:    MMT Right eval Left eval  Hip flexion 4 4  Hip extension 4 with lumbar extension compensation 4 with lumbar extension compensation  Hip abduction 4 4  Hip adduction    Hip internal rotation    Hip external rotation    Knee flexion 5 5  Knee extension 5 5  Ankle dorsiflexion    Ankle plantarflexion    Ankle inversion    Ankle eversion     (Blank rows = not tested)  LUMBAR SPECIAL TESTS:  (-) repeated flexion test Long sit test suggests posterior nutation of L innominate.   FUNCTIONAL TESTS:   GAIT: Distance walked: 60 ft Assistive device utilized: None Level of assistance: Complete Independence Comments: decreased stance L LE, B pelvic drop  TREATMENT DATE 06/23/23                                                                                                                              -  lateral shuffle warmup 3x21ft bilat (supervision level) (some right knee soreness)  -forward/backward shuffle 5x28ft alternating    -Matrix rotary hip machina 1x12 @ 45lb bilat, 1x10 hip ABDCT @ 45lb  -Matrix rotary hip machina 1x12 @ 45lb bilat, 1x10 hip ABDCT @ 45lb  -seated OMEGA lateral low cable 1x15 @ 15lbs (easy effort, first time add on)  -romanian dead lift, 20lb KB to 8" platform 1x10 (easy effort, slight knee bend, feels lumbar spine  muscle targetting without pain)   -SLS on airex foam 10x10secH bilat   -crunches x10, reverse curlup x10 (blue ball between knees)  -crunches x10, reverse curlup x10 (blue ball between knees)    PATIENT EDUCATION:  Education details: there-ex, HEP Person educated: Patient Education method: Explanation, Demonstration, Tactile cues, Verbal cues, and Handouts Education comprehension: verbalized understanding and returned demonstration  HOME EXERCISE PROGRAM: Access Code: [email or web address] URL: [email or web address] Date: 05/17/2023 Prepared by: Suzzane Estes  Exercises - Supine Piriformis Stretch with Leg Straight  - 3 x daily - 7 x weekly - 1 sets - 5 reps - 30 seconds  hold - Seated Hip Internal Rotation AROM  - 1 x daily - 7 x weekly - 3 sets - 10 reps - 5 seconds hold - Right Standing Lateral Shift Correction at Wall - Repetitions  - 1 x daily - 7 x weekly - 3 sets - 10 reps - 5 seconds hold  - Standing Hip Abduction with Resistance at Ankles and Counter Support  - 1 x daily - 7 x weekly - 3 sets - 10 reps  Yellow band Red band upgrade on 06/05/2023  - Supine March with Posterior Pelvic Tilt  - 1 x daily - 7 x weekly - 3 sets - 10 reps - Supine Lower Trunk Rotation  - 1 x daily - 7 x weekly - 3 sets - 10 reps - 5 seconds hold - Supine Posterior Pelvic Tilt  - 1 x daily - 7 x weekly - 3 sets - 10 reps - 10 seconds hold - Forward T with Counter Support  - 1 x daily - 7 x weekly - 2-3 sets - 10 reps -SLS with contralateral UE  assist and with contralateral LE hip flexion and extension   R 1 minute x 3 - Quadruped Bent Leg Hip Extension  - 2 x daily - 7 x weekly - 1 sets - 10 reps - 5 seconds hold  ASSESSMENT:  CLINICAL IMPRESSION: Pt able to advance loading on hip loading today. Added in RDL and row to integrate upper and low backs, no additional pain, excellent activation and proprioception. Advanced SLS balance to foam surface.  Pt will benefit from continued skilled physical therapy services to decrease pain, improve strength and function.   OBJECTIVE IMPAIRMENTS: difficulty walking, decreased ROM, decreased strength, improper body mechanics, postural dysfunction, and pain.   ACTIVITY LIMITATIONS: standing and locomotion level  PARTICIPATION LIMITATIONS:   PERSONAL FACTORS: Age, Fitness, Time since onset of injury/illness/exacerbation, and 1-2 comorbidities: Osteopenia, arthritis, skin CA are also affecting patient's functional outcome.   REHAB POTENTIAL: Fair    CLINICAL DECISION MAKING: Stable/uncomplicated  EVALUATION COMPLEXITY: Low  GOALS: Goals reviewed with patient? Yes  SHORT TERM GOALS: Target date: 06/02/2023  Pt will be independent with her initial HEP to improve posture, strength, function, and ability to perform standing tasks and ambulate longer distances more comfortably for her back.  Baseline: Pt has started her initial HEP. (05/17/2023); No questions with her HEP (06/20/2023) Goal status: MET  LONG  TERM GOALS: Target date: 07/14/2023  Pt will have a decrease in low back pain to 2/10 or less at worst to promote ability to ambulate longer distances, perform standing tasks, and play tennis more comfortably for her back.  Baseline: L low back pain. 5/10 at worst for the past 3 months  (05/17/2023); 4/10 random pain at worst for the past 7 days (06/12/2023); 3-4/10 random back pain. (06/20/2023)  Goal status: ONGOING  2.  Pt will improve B hip extension and abduction strength by at least 1/2  MMT grade to promote ability to perform standing tasks as well as ambulate with less low back pain.  Baseline:  MMT Right eval Left eval R ( 06/20/2023) L (06/20/2023)  Hip extension 4 with lumbar extension compensation 4 with lumbar extension compensation 4 4  Hip abduction 4 4 4+ 4+   (05/17/2023)  Goal status: PARTIALLY MET  3.  Pt will improve B hip IR ROM by at least 10 degrees to promote ability to ambulate longer distances more comfortably for her back.  Baseline:  Passive  Right eval Left eval R (06/20/2023) L (06/20/2023)  Hip internal rotation 22 degrees  18 degrees 28 degrees  30 degrees   (05/17/2023) Goal status: PARTIALLY MET   4.  Pt will improve her Modified Oswestry Low Back Pain disability Questionnaire by at least 10% as a demonstration of improved function.  Baseline: 13/50 (26%) (05/17/2023); 8/50 (16%) (06/20/2023) Goal status: PARTIALLY MET  PLAN:  PT FREQUENCY: 1-2x/week  PT DURATION: 8 weeks  PLANNED INTERVENTIONS: 97110-Therapeutic exercises, 97530- Therapeutic activity, [id number]- Neuromuscular re-education, 97535- Self Care, 36144- Manual therapy, G0283- Electrical stimulation (unattended), [phone]- Ionotophoresis 4mg /ml Dexamethasone, Patient/Family education, and Dry Needling.  PLAN FOR NEXT SESSION: Posture, trunk and hip strengthening, hip ROM, manual techniques, modalities PRN  Thank you for your referral.  Dawn Eth, PT, DPT 06/23/2023, 9:50 AM  9:51 AM, 06/23/23 Dawn Eth, PT, DPT Physical Therapist - [place] [phone] (Office)

## 2023-06-26 ENCOUNTER — Ambulatory Visit: Attending: Orthopaedic Surgery

## 2023-06-26 DIAGNOSIS — M5459 Other low back pain: Secondary | ICD-10-CM | POA: Diagnosis not present

## 2023-06-26 NOTE — Therapy (Signed)
 OUTPATIENT PHYSICAL THERAPY TREATMENT    Patient Name: Natalie Sosa MRN: [id number] DOB:September 26, 1945, 78 y.o., female Today's Date: 06/26/2023  END OF SESSION:  PT End of Session - 06/26/23 1348     Visit Number 12    Number of Visits 17    Date for PT Re-Evaluation 07/14/23    Progress Note Due on Visit 20    PT Start Time 1349    PT Stop Time 1430    PT Time Calculation (min) 41 min    Activity Tolerance Patient tolerated treatment well;No increased pain    Behavior During Therapy [facility], [place] for tasks assessed/performed                      Past Medical History:  Diagnosis Date   Arthritis    right knee, right thumb   Bright red rectal bleeding 09/01/2021   Cancer (HCC)    skin   COVID-19 12/31/2020   ENDOMETRIOSIS 10/04/2006   Qualifier: History of  By: Roxine Cordia MD, Candie Chamber    Suprapubic pressure 09/01/2021   Past Surgical History:  Procedure Laterality Date   ACHILLES TENDON REPAIR  06/14/2004   CATARACT EXTRACTION W/PHACO Right 11/09/2022   Procedure: CATARACT EXTRACTION PHACO AND INTRAOCULAR LENS PLACEMENT (IOC) RIGHT TORIC 7.96 00:48.4;  Surgeon: Annell Kidney, MD;  Location: [facility];  Service: Ophthalmology;  Laterality: Right;   CATARACT EXTRACTION W/PHACO Left 12/07/2022   Procedure: CATARACT EXTRACTION PHACO AND INTRAOCULAR LENS PLACEMENT (IOC) LEFT  CLAREON VIVITY TORIC LENS 7.43 00:49.5;  Surgeon: Annell Kidney, MD;  Location: [facility];  Service: Ophthalmology;  Laterality: Left;   CESAREAN SECTION     placenta previa   CHOLECYSTECTOMY  06/13/2001   COLONOSCOPY WITH PROPOFOL  N/A 09/24/2021   Procedure: COLONOSCOPY WITH PROPOFOL ;  Surgeon: Selena Daily, MD;  Location: [facility];  Service: Gastroenterology;  Laterality: N/A;   Patient Active Problem List   Diagnosis Date Noted   Adjustment reaction with anxiety and depression 03/01/2023   Osteoarthritis 02/17/2022   Encounter for annual general  medical examination with abnormal findings in adult 02/17/2022   Arthritis of carpometacarpal [facility]) joint of right thumb 12/28/2021   Polyp of cecum    Unilateral primary osteoarthritis, right knee 09/22/2021   Screen for colon cancer 01/11/2016   Insomnia 01/01/2014   HYPERCHOLESTEROLEMIA 10/04/2006    PCP: Gabriel John, NP   REFERRING PROVIDER: Arnie Lao MD  REFERRING DIAG: Low back pain, DDD  Rationale for Evaluation and Treatment: Rehabilitation  THERAPY DIAG:  Other low back pain  ONSET DATE: 5-6 years ago  SUBJECTIVE:  SUBJECTIVE STATEMENT: Low back is ok right now, 0/10 currently. Back was not happy after playing tennis last time for about an hour, 5/10 L low back. Got better when driving home. Going to play tennis again tomorrow.        PERTINENT HISTORY:  Low back pain.  Pt was playing tennis 6 years ago. Pt was trying to arch her back to get a better tennis serve. After a while pain worsened. Pt then went to a chiropractor but also rested. Got an x-ray which revealed scoliosis on L side. The pain would take her breath away. Sitting and resting helps. Has been dealing with her back pain ever since. Has not hurt that bad again. Pt also has R dorsal foot numbness, got an MRI for her low back.    No latex allergies No blood pressure problems per pt.  Has osteopenia/osteoporosis   PAIN:  Are you having pain? Yes: NPRS scale: 0/10 Pain location: L low back Pain description: sharp Aggravating factors: Lunging to the R or L while playing tennis; serving at tennis (does not do that anymore), walking about 15 minutes; standing for 30 minutes Relieving factors: sitting for 5 minutes   PRECAUTIONS: Osteopenia per bone density on 04/13/2023  RED FLAGS: Bowel or  bladder incontinence: No and Cauda equina syndrome: No   WEIGHT BEARING RESTRICTIONS: No  FALLS:  Has patient fallen in last 6 months? No  LIVING ENVIRONMENT: Lives with: lives with their spouse Lives in: House/apartment Stairs: No Has following equipment at home: None  OCCUPATION: retired  PLOF: Independent  PATIENT GOALS: Get her muscles strong enough so the degeneration does not get worse.   NEXT MD VISIT: None yet  OBJECTIVE:  Note: Objective measures were completed at Evaluation unless otherwise noted.  DIAGNOSTIC FINDINGS:  MR Lumbar Spine w/o contrast 03/31/2023  Narrative & Impression  CLINICAL DATA:  78 year old female with 4 months of low back pain. Numbness at the top of the right foot.   EXAM: MRI LUMBAR SPINE WITHOUT CONTRAST   TECHNIQUE: Multiplanar, multisequence MR imaging of the lumbar spine was performed. No intravenous contrast was administered.   COMPARISON:  None Available.   FINDINGS: Segmentation: Lumbar segmentation appears to be normal and will be designated as such for this report.   Alignment: Levoconvex lumbar scoliosis is mild-to-moderate, apex at L3-L4. Associated straightening of lumbar lordosis.   Vertebrae: Normal background bone marrow signal. Widespread lumbar degenerative endplate spurring. Mostly chronic degenerative endplate marrow signal changes associated. Faint superimposed endplate marrow edema such as at L2-L3 and L3-L4 on series 109, image 7. Intact visible sacrum and SI joints. Incidental small S2-S3 sacral Tarlov cysts (normal variant).   Conus medullaris and cauda equina: Conus extends to the L1-L2 level. No lower spinal cord or conus signal abnormality. Generally normal cauda equina nerve roots.   Paraspinal and other soft tissues: Negative.   Disc levels:   T11-T12 and   T12-L1:  Negative.   L1-L2: Disc desiccation and disc space loss. Disc bulging asymmetric to the left. No spinal or convincing lateral  recess stenosis. Mild mostly left side L1 foraminal stenosis.   L2-L3: Severe disc space loss. Circumferential disc osteophyte complex. Mild facet and ligament flavum hypertrophy greater on the right. No spinal or convincing lateral recess stenosis. Mild more so than moderate bilateral L2 neural foraminal stenosis.   L3-L4: Similar disc space loss. Circumferential disc bulging and endplate spurring with asymmetric left foraminal component of disc on series 7, image 13. Mild facet  and ligament flavum hypertrophy. No spinal or lateral recess stenosis. Moderate left and mild to moderate right L3 neural foraminal stenosis.   L4-L5: Severe disc space loss. Circumferential disc osteophyte complex. Mild to moderate facet and ligament flavum hypertrophy greater on the left. No significant spinal stenosis. Mild left lateral recess stenosis (descending left L5 nerve level series 115, image 27). Moderate left and moderate to severe right (series 7, image 4) L4 neural foraminal stenosis.   L5-S1: Better preserved disc space. Circumferential disc bulging is asymmetric to the left. Moderate facet and ligament flavum hypertrophy greater on the left. Degenerative left facet joint fluid. No spinal stenosis, but moderate left lateral recess stenosis (descending left L5 nerve level series 115, image 32). Severe left L5 neural foraminal stenosis (series 7, image 11). Only borderline to mild right foraminal stenosis.   IMPRESSION: 1. Advanced lumbar spine disc and endplate degeneration in the setting of mild to moderate levoconvex scoliosis. Faint degenerative mid lumbar endplate marrow edema. 2. No significant lumbar spinal stenosis due to capacious underlying spinal canal. Right side neural impingement maximal at the right L4 neural foramina, multifactorial. Multilevel moderate and occasionally severe (left L5 nerve level) left side lateral recess and foraminal stenosis.     Electronically  Signed   By: Marlise Simpers M.D.   On: 04/17/2023 08:34     PATIENT SURVEYS:  Modified Oswestry 13/50 (26%)   COGNITION: Overall cognitive status: Within functional limits for tasks assessed     SENSATION: WFL  MUSCLE LENGTH:   POSTURE: forward neck, movement preference around C5/C6 area, and C3/C4 area, B protracted shoulders, R shoulder higher, thoracic kyphosis, R lateral shift   R  convexity at thoracolumbar junction L lumbar convexity around L2,3,4 area L lumbar rotation  B genu valgus R > L   Improved low back comfort level with manual posturral correction from PT  Prone position: L posterior pelvic rotation    PALPATION: TTP L SI joint area.  L ASIS more posterior compared to R   LUMBAR ROM:   AROM eval  Flexion WFL  Extension WFL with R lumbar rotation  Right lateral flexion WFL  Left lateral flexion WFL  Right rotation ful  Left rotation ful     Lumbar extension with R rotation WFL  Lumbar extension with L rotation  WFL with slight symptoms   (Blank rows = not tested)   LOWER EXTREMITY ROM:     Passive  Right eval Left eval  Hip flexion    Hip extension    Hip abduction    Hip adduction    Hip internal rotation 22 degrees  18 degrees  Hip external rotation  [facility]  Knee flexion    Knee extension    Ankle dorsiflexion    Ankle plantarflexion    Ankle inversion    Ankle eversion     (Blank rows = not tested)  LOWER EXTREMITY MMT:    MMT Right eval Left eval  Hip flexion 4 4  Hip extension 4 with lumbar extension compensation 4 with lumbar extension compensation  Hip abduction 4 4  Hip adduction    Hip internal rotation    Hip external rotation    Knee flexion 5 5  Knee extension 5 5  Ankle dorsiflexion    Ankle plantarflexion    Ankle inversion    Ankle eversion     (Blank rows = not tested)  LUMBAR SPECIAL TESTS:  (-) repeated flexion test Long sit test suggests posterior nutation  of L innominate.    FUNCTIONAL TESTS:     GAIT: Distance walked: 60 ft Assistive device utilized: None Level of assistance: Complete Independence Comments: decreased stance L LE, B pelvic drop  TREATMENT DATE: 06/26/2023                                                                                                                               Therapeutic Exercise  Standing posture L lumbar rotation, R lumbar side bend.   Standing PNF chops to the R   Green band 10x5 seconds for 3 sets    Standing L shoulder adduction green band 10x2 with 5 second holds   Decreased L lumbar rotation posture observed.    Standing hip machine height  4             Hip extension                         R plate 45 for [id number]                         L plate 45 for [id number]            L to R pressure to L lumbar convexity with gray band    With L trunk side bend   In standing 10x5 seconds    Then in sitting 10x5 seconds    Slight improved lumbar posture observed after aforementioned exercise.     Improved exercise technique, movement at target joints, use of target muscles after mod verbal, visual, tactile cues.      PATIENT EDUCATION:  Education details: there-ex, HEP Person educated: Patient Education method: Explanation, Demonstration, Tactile cues, Verbal cues, and Handouts Education comprehension: verbalized understanding and returned demonstration  HOME EXERCISE PROGRAM: Access Code: [email or web address] URL: [email or web address] Date: 05/17/2023 Prepared by: Suzzane Estes  Exercises - Supine Piriformis Stretch with Leg Straight  - 3 x daily - 7 x weekly - 1 sets - 5 reps - 30 seconds  hold - Seated Hip Internal Rotation AROM  - 1 x daily - 7 x weekly - 3 sets - 10 reps - 5 seconds hold - Right Standing Lateral Shift Correction at Wall - Repetitions  - 1 x daily - 7 x weekly - 3 sets - 10 reps - 5 seconds hold  - Standing Hip Abduction with Resistance at Ankles and Counter Support  - 1 x daily - 7 x weekly - 3 sets  - 10 reps  Yellow band  Red band upgrade on 06/05/2023  - Supine March with Posterior Pelvic Tilt  - 1 x daily - 7 x weekly - 3 sets - 10 reps  - Supine Lower Trunk Rotation  - 1 x daily - 7 x weekly - 3 sets - 10 reps - 5 seconds hold  - Supine Posterior Pelvic Tilt  - 1 x daily - 7  x weekly - 3 sets - 10 reps - 10 seconds hold  - Forward T with Counter Support  - 1 x daily - 7 x weekly - 2-3 sets - 10 reps    SLS with contralateral UE assist and with contralateral LE hip flexion and extension   R 1 minute x 3  - Quadruped Bent Leg Hip Extension  - 2 x daily - 7 x weekly - 1 sets - 10 reps - 5 seconds hold  Standing PNF chops to the R   Green band 10x5 seconds for 3 sets  - Shoulder Adduction with Anchored Resistance  - 1 x daily - 7 x weekly - 3 sets - 10 reps - 5 seconds hold  Green band.   L to R pressure to L lumbar convexity with gray band    With L trunk side bend   in sitting 10x5 seconds    ASSESSMENT:  CLINICAL IMPRESSION: Worked on decreasing L lumbar convexity, R lumbar side bend, and L lumbar rotation to decrease stress to low back. Continued working on [employer] as well to decrease stress to low back.  Pt tolerated session well without aggravation of symptoms. Pt will benefit from continued skilled physical therapy services to decrease pain, improve strength and function.     OBJECTIVE IMPAIRMENTS: difficulty walking, decreased ROM, decreased strength, improper body mechanics, postural dysfunction, and pain.   ACTIVITY LIMITATIONS: standing and locomotion level  PARTICIPATION LIMITATIONS:   PERSONAL FACTORS: Age, Fitness, Time since onset of injury/illness/exacerbation, and 1-2 comorbidities: Osteopenia, arthritis, skin CA are also affecting patient's functional outcome.   REHAB POTENTIAL: Fair    CLINICAL DECISION MAKING: Stable/uncomplicated  EVALUATION COMPLEXITY: Low   GOALS: Goals reviewed with patient? Yes  SHORT TERM GOALS: Target  date: 06/02/2023  Pt will be independent with her initial HEP to improve posture, strength, function, and ability to perform standing tasks and ambulate longer distances more comfortably for her back.  Baseline: Pt has started her initial HEP. (05/17/2023); No questions with her HEP (06/20/2023) Goal status: MET      LONG TERM GOALS: Target date: 07/14/2023  Pt will have a decrease in low back pain to 2/10 or less at worst to promote ability to ambulate longer distances, perform standing tasks, and play tennis more comfortably for her back.  Baseline: L low back pain. 5/10 at worst for the past 3 months  (05/17/2023); 4/10 random pain at worst for the past 7 days (06/12/2023); 3-4/10 random back pain. (06/20/2023)  Goal status: ONGOING  2.  Pt will improve B hip extension and abduction strength by at least 1/2 MMT grade to promote ability to perform standing tasks as well as ambulate with less low back pain.  Baseline:  MMT Right eval Left eval R ( 06/20/2023) L (06/20/2023)  Hip extension 4 with lumbar extension compensation 4 with lumbar extension compensation 4 4  Hip abduction 4 4 4+ 4+   (05/17/2023)   Goal status: PARTIALLY MET  3.  Pt will improve B hip IR ROM by at least 10 degrees to promote ability to ambulate longer distances more comfortably for her back.  Baseline:  Passive  Right eval Left eval R (06/20/2023) L (06/20/2023)  Hip internal rotation 22 degrees  18 degrees 28 degrees  30 degrees   (05/17/2023)  Goal status: PARTIALLY MET   4.  Pt will improve her Modified Oswestry Low Back Pain disability Questionnaire by at least 10% as a demonstration of improved function.  Baseline: 13/50 (26%) (05/17/2023); 8/50 (16%) (06/20/2023) Goal status: PARTIALLY MET    PLAN:  PT FREQUENCY: 1-2x/week  PT DURATION: 8 weeks  PLANNED INTERVENTIONS: 97110-Therapeutic exercises, 97530- Therapeutic activity, [id number]- Neuromuscular re-education, 97535- Self Care, [id number]- Manual  therapy, G0283- Electrical stimulation (unattended), 82956- Ionotophoresis 4mg /ml Dexamethasone, Patient/Family education, and Dry Needling.  PLAN FOR NEXT SESSION: Posture, trunk and hip strengthening, hip ROM, manual techniques, modalities PRN   Demarko Zeimet, PT, DPT 06/26/2023, 4:40 PM

## 2023-06-28 ENCOUNTER — Ambulatory Visit

## 2023-06-28 DIAGNOSIS — M5459 Other low back pain: Secondary | ICD-10-CM | POA: Diagnosis not present

## 2023-06-28 NOTE — Therapy (Signed)
 OUTPATIENT PHYSICAL THERAPY TREATMENT    Patient Name: Natalie Sosa MRN: [id number] DOB:08-Jul-1945, 78 y.o., female Today's Date: 06/28/2023  END OF SESSION:  PT End of Session - 06/28/23 1301     Visit Number 13    Number of Visits 17    Date for PT Re-Evaluation 07/14/23    Progress Note Due on Visit 20    PT Start Time 1301    PT Stop Time 1344    PT Time Calculation (min) 43 min    Activity Tolerance Patient tolerated treatment well    Behavior During Therapy [facility] for tasks assessed/performed                       Past Medical History:  Diagnosis Date   Arthritis    right knee, right thumb   Bright red rectal bleeding 09/01/2021   Cancer (HCC)    skin   COVID-19 12/31/2020   ENDOMETRIOSIS 10/04/2006   Qualifier: History of  By: Roxine Cordia MD, Candie Chamber    Suprapubic pressure 09/01/2021   Past Surgical History:  Procedure Laterality Date   ACHILLES TENDON REPAIR  06/14/2004   CATARACT EXTRACTION W/PHACO Right 11/09/2022   Procedure: CATARACT EXTRACTION PHACO AND INTRAOCULAR LENS PLACEMENT (IOC) RIGHT TORIC 7.96 00:48.4;  Surgeon: Annell Kidney, MD;  Location: [facility];  Service: Ophthalmology;  Laterality: Right;   CATARACT EXTRACTION W/PHACO Left 12/07/2022   Procedure: CATARACT EXTRACTION PHACO AND INTRAOCULAR LENS PLACEMENT (IOC) LEFT  CLAREON VIVITY TORIC LENS 7.43 00:49.5;  Surgeon: Annell Kidney, MD;  Location: [facility];  Service: Ophthalmology;  Laterality: Left;   CESAREAN SECTION     placenta previa   CHOLECYSTECTOMY  06/13/2001   COLONOSCOPY WITH PROPOFOL  N/A 09/24/2021   Procedure: COLONOSCOPY WITH PROPOFOL ;  Surgeon: Selena Daily, MD;  Location: [facility];  Service: Gastroenterology;  Laterality: N/A;   Patient Active Problem List   Diagnosis Date Noted   Adjustment reaction with anxiety and depression 03/01/2023   Osteoarthritis 02/17/2022   Encounter for annual general medical examination  with abnormal findings in adult 02/17/2022   Arthritis of carpometacarpal [facility]) joint of right thumb 12/28/2021   Polyp of cecum    Unilateral primary osteoarthritis, right knee 09/22/2021   Screen for colon cancer 01/11/2016   Insomnia 01/01/2014   HYPERCHOLESTEROLEMIA 10/04/2006    PCP: Gabriel John, NP   REFERRING PROVIDER: Arnie Lao MD  REFERRING DIAG: Low back pain, DDD  Rationale for Evaluation and Treatment: Rehabilitation  THERAPY DIAG:  Other low back pain  ONSET DATE: 5-6 years ago  SUBJECTIVE:  SUBJECTIVE STATEMENT: Tennis was ok. Back started hurting a little bit after an hour (4-5/10). Sat down and felt better. Started playing tennis again and it bothered her again. Did the strap exercise this morning, and was fine while doing that. Back bothered her again afterwards.  No back pain currently. Tightening her stomach helps her back when standing.        PERTINENT HISTORY:  Low back pain.  Pt was playing tennis 6 years ago. Pt was trying to arch her back to get a better tennis serve. After a while pain worsened. Pt then went to a chiropractor but also rested. Got an x-ray which revealed scoliosis on L side. The pain would take her breath away. Sitting and resting helps. Has been dealing with her back pain ever since. Has not hurt that bad again. Pt also has R dorsal foot numbness, got an MRI for her low back.    No latex allergies No blood pressure problems per pt.  Has osteopenia/osteoporosis   PAIN:  Are you having pain? Yes: NPRS scale: 0/10 Pain location: L low back Pain description: sharp Aggravating factors: Lunging to the R or L while playing tennis; serving at tennis (does not do that anymore), walking about 15 minutes; standing for 30  minutes Relieving factors: sitting for 5 minutes   PRECAUTIONS: Osteopenia per bone density on 04/13/2023  RED FLAGS: Bowel or bladder incontinence: No and Cauda equina syndrome: No   WEIGHT BEARING RESTRICTIONS: No  FALLS:  Has patient fallen in last 6 months? No  LIVING ENVIRONMENT: Lives with: lives with their spouse Lives in: House/apartment Stairs: No Has following equipment at home: None  OCCUPATION: retired  PLOF: Independent  PATIENT GOALS: Get her muscles strong enough so the degeneration does not get worse.   NEXT MD VISIT: None yet  OBJECTIVE:  Note: Objective measures were completed at Evaluation unless otherwise noted.  DIAGNOSTIC FINDINGS:  MR Lumbar Spine w/o contrast 03/31/2023  Narrative & Impression  CLINICAL DATA:  78 year old female with 4 months of low back pain. Numbness at the top of the right foot.   EXAM: MRI LUMBAR SPINE WITHOUT CONTRAST   TECHNIQUE: Multiplanar, multisequence MR imaging of the lumbar spine was performed. No intravenous contrast was administered.   COMPARISON:  None Available.   FINDINGS: Segmentation: Lumbar segmentation appears to be normal and will be designated as such for this report.   Alignment: Levoconvex lumbar scoliosis is mild-to-moderate, apex at L3-L4. Associated straightening of lumbar lordosis.   Vertebrae: Normal background bone marrow signal. Widespread lumbar degenerative endplate spurring. Mostly chronic degenerative endplate marrow signal changes associated. Faint superimposed endplate marrow edema such as at L2-L3 and L3-L4 on series 109, image 7. Intact visible sacrum and SI joints. Incidental small S2-S3 sacral Tarlov cysts (normal variant).   Conus medullaris and cauda equina: Conus extends to the L1-L2 level. No lower spinal cord or conus signal abnormality. Generally normal cauda equina nerve roots.   Paraspinal and other soft tissues: Negative.   Disc levels:   T11-T12 and    T12-L1:  Negative.   L1-L2: Disc desiccation and disc space loss. Disc bulging asymmetric to the left. No spinal or convincing lateral recess stenosis. Mild mostly left side L1 foraminal stenosis.   L2-L3: Severe disc space loss. Circumferential disc osteophyte complex. Mild facet and ligament flavum hypertrophy greater on the right. No spinal or convincing lateral recess stenosis. Mild more so than moderate bilateral L2 neural foraminal stenosis.   L3-L4: Similar disc space  loss. Circumferential disc bulging and endplate spurring with asymmetric left foraminal component of disc on series 7, image 13. Mild facet and ligament flavum hypertrophy. No spinal or lateral recess stenosis. Moderate left and mild to moderate right L3 neural foraminal stenosis.   L4-L5: Severe disc space loss. Circumferential disc osteophyte complex. Mild to moderate facet and ligament flavum hypertrophy greater on the left. No significant spinal stenosis. Mild left lateral recess stenosis (descending left L5 nerve level series 115, image 27). Moderate left and moderate to severe right (series 7, image 4) L4 neural foraminal stenosis.   L5-S1: Better preserved disc space. Circumferential disc bulging is asymmetric to the left. Moderate facet and ligament flavum hypertrophy greater on the left. Degenerative left facet joint fluid. No spinal stenosis, but moderate left lateral recess stenosis (descending left L5 nerve level series 115, image 32). Severe left L5 neural foraminal stenosis (series 7, image 11). Only borderline to mild right foraminal stenosis.   IMPRESSION: 1. Advanced lumbar spine disc and endplate degeneration in the setting of mild to moderate levoconvex scoliosis. Faint degenerative mid lumbar endplate marrow edema. 2. No significant lumbar spinal stenosis due to capacious underlying spinal canal. Right side neural impingement maximal at the right L4 neural foramina, multifactorial.  Multilevel moderate and occasionally severe (left L5 nerve level) left side lateral recess and foraminal stenosis.     Electronically Signed   By: Marlise Simpers M.D.   On: 04/17/2023 08:34     PATIENT SURVEYS:  Modified Oswestry 13/50 (26%)   COGNITION: Overall cognitive status: Within functional limits for tasks assessed     SENSATION: WFL  MUSCLE LENGTH:   POSTURE: forward neck, movement preference around C5/C6 area, and C3/C4 area, B protracted shoulders, R shoulder higher, thoracic kyphosis, R lateral shift   R  convexity at thoracolumbar junction L lumbar convexity around L2,3,4 area L lumbar rotation  B genu valgus R > L   Improved low back comfort level with manual posturral correction from PT  Prone position: L posterior pelvic rotation    PALPATION: TTP L SI joint area.  L ASIS more posterior compared to R   LUMBAR ROM:   AROM eval  Flexion WFL  Extension WFL with R lumbar rotation  Right lateral flexion WFL  Left lateral flexion WFL  Right rotation ful  Left rotation ful     Lumbar extension with R rotation WFL  Lumbar extension with L rotation  WFL with slight symptoms   (Blank rows = not tested)   LOWER EXTREMITY ROM:     Passive  Right eval Left eval R (06/28/2023) L (06/28/2023)  Hip flexion      Hip extension   12 12  Hip abduction      Hip adduction      Hip internal rotation 22 degrees  18 degrees    Hip external rotation  [facility]    Knee flexion      Knee extension      Ankle dorsiflexion      Ankle plantarflexion      Ankle inversion      Ankle eversion       (Blank rows = not tested)  LOWER EXTREMITY MMT:    MMT Right eval Left eval R (06/28/2023) L (06/28/2023)  Hip flexion 4 4    Hip extension 4 with lumbar extension compensation 4 with lumbar extension compensation 4 4  Hip abduction 4 4    Hip adduction      Hip internal  rotation      Hip external rotation      Knee flexion 5 5    Knee extension 5 5    Ankle  dorsiflexion      Ankle plantarflexion      Ankle inversion      Ankle eversion       (Blank rows = not tested)  LUMBAR SPECIAL TESTS:  (-) repeated flexion test Long sit test suggests posterior nutation of L innominate.    FUNCTIONAL TESTS:    GAIT: Distance walked: 60 ft Assistive device utilized: None Level of assistance: Complete Independence Comments: decreased stance L LE, B pelvic drop  TREATMENT DATE: 06/26/2023                                                                                                                                Therapeutic exercise  Prone manually resisted glute max extension 1x each way  S/L hip extension with PT 1x each LE  Prone glute max extension with pillow under abdomen  L 10x3  R 10x3  Improved exercise technique, movement at target joints, use of target muscles after min to mod verbal, visual, tactile cues.    Neuromuscular re education  Quadruped bird dog   10x3  Tactile cues with dowel at low back for maintaining level pelvis as best as possible  Bridge with posterior pelvic tilt 10x3  Reverse crunch 5x6  Crunch 10x    Improved technique, movement at target joints, use of target muscles after mod verbal, visual, tactile cues.      PATIENT EDUCATION:  Education details: there-ex, HEP Person educated: Patient Education method: Explanation, Demonstration, Tactile cues, Verbal cues, and Handouts Education comprehension: verbalized understanding and returned demonstration  HOME EXERCISE PROGRAM: Access Code: [email or web address] URL: [email or web address] Date: 05/17/2023 Prepared by: Suzzane Estes  Exercises - Supine Piriformis Stretch with Leg Straight  - 3 x daily - 7 x weekly - 1 sets - 5 reps - 30 seconds  hold - Seated Hip Internal Rotation AROM  - 1 x daily - 7 x weekly - 3 sets - 10 reps - 5 seconds hold - Right Standing Lateral Shift Correction at Wall - Repetitions  - 1 x daily - 7 x weekly - 3 sets  - 10 reps - 5 seconds hold  - Standing Hip Abduction with Resistance at Ankles and Counter Support  - 1 x daily - 7 x weekly - 3 sets - 10 reps  Yellow band  Red band upgrade on 06/05/2023  - Supine March with Posterior Pelvic Tilt  - 1 x daily - 7 x weekly - 3 sets - 10 reps  Replaced with reverse crunch on 06/28/2023  - Supine Lower Trunk Rotation  - 1 x daily - 7 x weekly - 3 sets - 10 reps - 5 seconds hold  - Supine Posterior Pelvic Tilt  - 1 x daily - 7 x weekly - 3 sets -  10 reps - 10 seconds hold  - Forward T with Counter Support  - 1 x daily - 7 x weekly - 2-3 sets - 10 reps    SLS with contralateral UE assist and with contralateral LE hip flexion and extension   R 1 minute x 3  - Quadruped Bent Leg Hip Extension  - 2 x daily - 7 x weekly - 1 sets - 10 reps - 5 seconds hold  Standing PNF chops to the R   Green band 10x5 seconds for 3 sets  - Shoulder Adduction with Anchored Resistance  - 1 x daily - 7 x weekly - 3 sets - 10 reps - 5 seconds hold  Green band.   L to R pressure to L lumbar convexity with gray band    With L trunk side bend   in sitting 10x5 seconds   Discontinued on 06/28/2023   - Bird Dog  - 1 x daily - 7 x weekly - 3 sets - 10 reps - Bilateral Bent Leg Lift  - 1 x daily - 3-4 x weekly - 6 sets - 5 reps   ASSESSMENT:  CLINICAL IMPRESSION: Continued working on improving glute max and abdominal strength to decrease stress to low back and secondary to core weakness observed during prone glute extension.  Pt tolerated session well without aggravation of symptoms. Pt will benefit from continued skilled physical therapy services to decrease pain, improve strength and function.     OBJECTIVE IMPAIRMENTS: difficulty walking, decreased ROM, decreased strength, improper body mechanics, postural dysfunction, and pain.   ACTIVITY LIMITATIONS: standing and locomotion level  PARTICIPATION LIMITATIONS:   PERSONAL FACTORS: Age, Fitness, Time since onset of  injury/illness/exacerbation, and 1-2 comorbidities: Osteopenia, arthritis, skin CA are also affecting patient's functional outcome.   REHAB POTENTIAL: Fair    CLINICAL DECISION MAKING: Stable/uncomplicated  EVALUATION COMPLEXITY: Low   GOALS: Goals reviewed with patient? Yes  SHORT TERM GOALS: Target date: 06/02/2023  Pt will be independent with her initial HEP to improve posture, strength, function, and ability to perform standing tasks and ambulate longer distances more comfortably for her back.  Baseline: Pt has started her initial HEP. (05/17/2023); No questions with her HEP (06/20/2023) Goal status: MET      LONG TERM GOALS: Target date: 07/14/2023  Pt will have a decrease in low back pain to 2/10 or less at worst to promote ability to ambulate longer distances, perform standing tasks, and play tennis more comfortably for her back.  Baseline: L low back pain. 5/10 at worst for the past 3 months  (05/17/2023); 4/10 random pain at worst for the past 7 days (06/12/2023); 3-4/10 random back pain. (06/20/2023)  Goal status: ONGOING  2.  Pt will improve B hip extension and abduction strength by at least 1/2 MMT grade to promote ability to perform standing tasks as well as ambulate with less low back pain.  Baseline:  MMT Right eval Left eval R ( 06/20/2023) L (06/20/2023)  Hip extension 4 with lumbar extension compensation 4 with lumbar extension compensation 4 4  Hip abduction 4 4 4+ 4+   (05/17/2023)   Goal status: PARTIALLY MET  3.  Pt will improve B hip IR ROM by at least 10 degrees to promote ability to ambulate longer distances more comfortably for her back.  Baseline:  Passive  Right eval Left eval R (06/20/2023) L (06/20/2023)  Hip internal rotation 22 degrees  18 degrees 28 degrees  30 degrees   (05/17/2023)  Goal status: PARTIALLY MET   4.  Pt will improve her Modified Oswestry Low Back Pain disability Questionnaire by at least 10% as a demonstration of improved  function.  Baseline: 13/50 (26%) (05/17/2023); 8/50 (16%) (06/20/2023) Goal status: PARTIALLY MET    PLAN:  PT FREQUENCY: 1-2x/week  PT DURATION: 8 weeks  PLANNED INTERVENTIONS: 97110-Therapeutic exercises, 97530- Therapeutic activity, [id number]- Neuromuscular re-education, 97535- Self Care, [id number]- Manual therapy, [id number]- Electrical stimulation (unattended), 91478- Ionotophoresis 4mg /ml Dexamethasone, Patient/Family education, and Dry Needling.  PLAN FOR NEXT SESSION: Posture, trunk and hip strengthening, hip ROM, manual techniques, modalities PRN   Freya Zobrist, PT, DPT 06/28/2023, 2:46 PM

## 2023-06-29 DIAGNOSIS — Z961 Presence of intraocular lens: Secondary | ICD-10-CM | POA: Diagnosis not present

## 2023-06-29 DIAGNOSIS — H40003 Preglaucoma, unspecified, bilateral: Secondary | ICD-10-CM | POA: Diagnosis not present

## 2023-06-29 DIAGNOSIS — H04123 Dry eye syndrome of bilateral lacrimal glands: Secondary | ICD-10-CM | POA: Diagnosis not present

## 2023-06-29 DIAGNOSIS — H43813 Vitreous degeneration, bilateral: Secondary | ICD-10-CM | POA: Diagnosis not present

## 2023-07-03 ENCOUNTER — Ambulatory Visit

## 2023-07-03 DIAGNOSIS — M5459 Other low back pain: Secondary | ICD-10-CM | POA: Diagnosis not present

## 2023-07-03 NOTE — Therapy (Signed)
 OUTPATIENT PHYSICAL THERAPY TREATMENT    Patient Name: Natalie Sosa MRN: [id number] DOB:Aug 26, 1945, 78 y.o., female Today's Date: 07/03/2023  END OF SESSION:  PT End of Session - 07/03/23 1033     Visit Number 14    Number of Visits 17    Date for PT Re-Evaluation 07/14/23    Progress Note Due on Visit 20    PT Start Time 1034    PT Stop Time 1115    PT Time Calculation (min) 41 min    Activity Tolerance Patient tolerated treatment well    Behavior During Therapy [facility] for tasks assessed/performed                        Past Medical History:  Diagnosis Date   Arthritis    right knee, right thumb   Bright red rectal bleeding 09/01/2021   Cancer (HCC)    skin   COVID-19 12/31/2020   ENDOMETRIOSIS 10/04/2006   Qualifier: History of  By: Roxine Cordia MD, Candie Chamber    Suprapubic pressure 09/01/2021   Past Surgical History:  Procedure Laterality Date   ACHILLES TENDON REPAIR  06/14/2004   CATARACT EXTRACTION W/PHACO Right 11/09/2022   Procedure: CATARACT EXTRACTION PHACO AND INTRAOCULAR LENS PLACEMENT (IOC) RIGHT TORIC 7.96 00:48.4;  Surgeon: Annell Kidney, MD;  Location: [facility];  Service: Ophthalmology;  Laterality: Right;   CATARACT EXTRACTION W/PHACO Left 12/07/2022   Procedure: CATARACT EXTRACTION PHACO AND INTRAOCULAR LENS PLACEMENT (IOC) LEFT  CLAREON VIVITY TORIC LENS 7.43 00:49.5;  Surgeon: Annell Kidney, MD;  Location: [facility];  Service: Ophthalmology;  Laterality: Left;   CESAREAN SECTION     placenta previa   CHOLECYSTECTOMY  06/13/2001   COLONOSCOPY WITH PROPOFOL  N/A 09/24/2021   Procedure: COLONOSCOPY WITH PROPOFOL ;  Surgeon: Selena Daily, MD;  Location: [facility];  Service: Gastroenterology;  Laterality: N/A;   Patient Active Problem List   Diagnosis Date Noted   Adjustment reaction with anxiety and depression 03/01/2023   Osteoarthritis 02/17/2022   Encounter for annual general medical examination  with abnormal findings in adult 02/17/2022   Arthritis of carpometacarpal [facility]) joint of right thumb 12/28/2021   Polyp of cecum    Unilateral primary osteoarthritis, right knee 09/22/2021   Screen for colon cancer 01/11/2016   Insomnia 01/01/2014   HYPERCHOLESTEROLEMIA 10/04/2006    PCP: Gabriel John, NP   REFERRING PROVIDER: Arnie Lao MD  REFERRING DIAG: Low back pain, DDD  Rationale for Evaluation and Treatment: Rehabilitation  THERAPY DIAG:  Other low back pain  ONSET DATE: 5-6 years ago  SUBJECTIVE:  SUBJECTIVE STATEMENT: Back is not too bad, No pain currently. Has not played tennis yet.      PERTINENT HISTORY:  Low back pain.  Pt was playing tennis 6 years ago. Pt was trying to arch her back to get a better tennis serve. After a while pain worsened. Pt then went to a chiropractor but also rested. Got an x-ray which revealed scoliosis on L side. The pain would take her breath away. Sitting and resting helps. Has been dealing with her back pain ever since. Has not hurt that bad again. Pt also has R dorsal foot numbness, got an MRI for her low back.    No latex allergies No blood pressure problems per pt.  Has osteopenia/osteoporosis   PAIN:  Are you having pain? Yes: NPRS scale: 0/10 Pain location: L low back Pain description: sharp Aggravating factors: Lunging to the R or L while playing tennis; serving at tennis (does not do that anymore), walking about 15 minutes; standing for 30 minutes Relieving factors: sitting for 5 minutes   PRECAUTIONS: Osteopenia per bone density on 04/13/2023  RED FLAGS: Bowel or bladder incontinence: No and Cauda equina syndrome: No   WEIGHT BEARING RESTRICTIONS: No  FALLS:  Has patient fallen in last 6 months? No  LIVING  ENVIRONMENT: Lives with: lives with their spouse Lives in: House/apartment Stairs: No Has following equipment at home: None  OCCUPATION: retired  PLOF: Independent  PATIENT GOALS: Get her muscles strong enough so the degeneration does not get worse.   NEXT MD VISIT: None yet  OBJECTIVE:  Note: Objective measures were completed at Evaluation unless otherwise noted.  DIAGNOSTIC FINDINGS:  MR Lumbar Spine w/o contrast 03/31/2023  Narrative & Impression  CLINICAL DATA:  78 year old female with 4 months of low back pain. Numbness at the top of the right foot.   EXAM: MRI LUMBAR SPINE WITHOUT CONTRAST   TECHNIQUE: Multiplanar, multisequence MR imaging of the lumbar spine was performed. No intravenous contrast was administered.   COMPARISON:  None Available.   FINDINGS: Segmentation: Lumbar segmentation appears to be normal and will be designated as such for this report.   Alignment: Levoconvex lumbar scoliosis is mild-to-moderate, apex at L3-L4. Associated straightening of lumbar lordosis.   Vertebrae: Normal background bone marrow signal. Widespread lumbar degenerative endplate spurring. Mostly chronic degenerative endplate marrow signal changes associated. Faint superimposed endplate marrow edema such as at L2-L3 and L3-L4 on series 109, image 7. Intact visible sacrum and SI joints. Incidental small S2-S3 sacral Tarlov cysts (normal variant).   Conus medullaris and cauda equina: Conus extends to the L1-L2 level. No lower spinal cord or conus signal abnormality. Generally normal cauda equina nerve roots.   Paraspinal and other soft tissues: Negative.   Disc levels:   T11-T12 and   T12-L1:  Negative.   L1-L2: Disc desiccation and disc space loss. Disc bulging asymmetric to the left. No spinal or convincing lateral recess stenosis. Mild mostly left side L1 foraminal stenosis.   L2-L3: Severe disc space loss. Circumferential disc osteophyte complex. Mild facet  and ligament flavum hypertrophy greater on the right. No spinal or convincing lateral recess stenosis. Mild more so than moderate bilateral L2 neural foraminal stenosis.   L3-L4: Similar disc space loss. Circumferential disc bulging and endplate spurring with asymmetric left foraminal component of disc on series 7, image 13. Mild facet and ligament flavum hypertrophy. No spinal or lateral recess stenosis. Moderate left and mild to moderate right L3 neural foraminal stenosis.   L4-L5: Severe  disc space loss. Circumferential disc osteophyte complex. Mild to moderate facet and ligament flavum hypertrophy greater on the left. No significant spinal stenosis. Mild left lateral recess stenosis (descending left L5 nerve level series 115, image 27). Moderate left and moderate to severe right (series 7, image 4) L4 neural foraminal stenosis.   L5-S1: Better preserved disc space. Circumferential disc bulging is asymmetric to the left. Moderate facet and ligament flavum hypertrophy greater on the left. Degenerative left facet joint fluid. No spinal stenosis, but moderate left lateral recess stenosis (descending left L5 nerve level series 115, image 32). Severe left L5 neural foraminal stenosis (series 7, image 11). Only borderline to mild right foraminal stenosis.   IMPRESSION: 1. Advanced lumbar spine disc and endplate degeneration in the setting of mild to moderate levoconvex scoliosis. Faint degenerative mid lumbar endplate marrow edema. 2. No significant lumbar spinal stenosis due to capacious underlying spinal canal. Right side neural impingement maximal at the right L4 neural foramina, multifactorial. Multilevel moderate and occasionally severe (left L5 nerve level) left side lateral recess and foraminal stenosis.     Electronically Signed   By: Marlise Simpers M.D.   On: 04/17/2023 08:34     PATIENT SURVEYS:  Modified Oswestry 13/50 (26%)   COGNITION: Overall cognitive status: Within  functional limits for tasks assessed     SENSATION: WFL  MUSCLE LENGTH:   POSTURE: forward neck, movement preference around C5/C6 area, and C3/C4 area, B protracted shoulders, R shoulder higher, thoracic kyphosis, R lateral shift   R  convexity at thoracolumbar junction L lumbar convexity around L2,3,4 area L lumbar rotation  B genu valgus R > L   Improved low back comfort level with manual posturral correction from PT  Prone position: L posterior pelvic rotation    PALPATION: TTP L SI joint area.  L ASIS more posterior compared to R   LUMBAR ROM:   AROM eval  Flexion WFL  Extension WFL with R lumbar rotation  Right lateral flexion WFL  Left lateral flexion WFL  Right rotation ful  Left rotation ful     Lumbar extension with R rotation WFL  Lumbar extension with L rotation  WFL with slight symptoms   (Blank rows = not tested)   LOWER EXTREMITY ROM:     Passive  Right eval Left eval R (06/28/2023) L (06/28/2023)  Hip flexion      Hip extension   12 12  Hip abduction      Hip adduction      Hip internal rotation 22 degrees  18 degrees    Hip external rotation  [facility]    Knee flexion      Knee extension      Ankle dorsiflexion      Ankle plantarflexion      Ankle inversion      Ankle eversion       (Blank rows = not tested)  LOWER EXTREMITY MMT:    MMT Right eval Left eval R (06/28/2023) L (06/28/2023)  Hip flexion 4 4    Hip extension 4 with lumbar extension compensation 4 with lumbar extension compensation 4 4  Hip abduction 4 4    Hip adduction      Hip internal rotation      Hip external rotation      Knee flexion 5 5    Knee extension 5 5    Ankle dorsiflexion      Ankle plantarflexion      Ankle inversion  Ankle eversion       (Blank rows = not tested)  LUMBAR SPECIAL TESTS:  (-) repeated flexion test Long sit test suggests posterior nutation of L innominate.    FUNCTIONAL TESTS:    GAIT: Distance walked: 60 ft Assistive device  utilized: None Level of assistance: Complete Independence Comments: decreased stance L LE, B pelvic drop  TREATMENT DATE: 07/03/2023                                                                                                                                Therapeutic exercise  Standing hip flexor stretch at stair step   L 30 seconds x 3  R 30 seconds x 3  Standing hip machine height 5  Hip extension    L plate 40 for 78G9   R plate 40 for 56O1       Hip abduction                          R plate 40 for 30Q then 5x5 seconds                          L plate 40 for 65H then 5x5 seconds    Improved exercise technique, movement at target joints, use of target muscles after min to mod verbal, visual, tactile cues.    Neuromuscular re education  Bridge 10x3  Crunch 5x5 seconds for 4 sets  Reverse crunch 5x6    Improved technique, movement at target joints, use of target muscles after mod verbal, visual, tactile cues.      PATIENT EDUCATION:  Education details: there-ex, HEP Person educated: Patient Education method: Explanation, Demonstration, Tactile cues, Verbal cues, and Handouts Education comprehension: verbalized understanding and returned demonstration  HOME EXERCISE PROGRAM: Access Code: [email or web address] URL: [email or web address] Date: 05/17/2023 Prepared by: Suzzane Estes  Exercises - Supine Piriformis Stretch with Leg Straight  - 3 x daily - 7 x weekly - 1 sets - 5 reps - 30 seconds  hold - Seated Hip Internal Rotation AROM  - 1 x daily - 7 x weekly - 3 sets - 10 reps - 5 seconds hold - Right Standing Lateral Shift Correction at Wall - Repetitions  - 1 x daily - 7 x weekly - 3 sets - 10 reps - 5 seconds hold  - Standing Hip Abduction with Resistance at Ankles and Counter Support  - 1 x daily - 7 x weekly - 3 sets - 10 reps  Yellow band  Red band upgrade on 06/05/2023  - Supine March with Posterior Pelvic Tilt  - 1 x daily - 7 x weekly - 3 sets -  10 reps  Replaced with reverse crunch on 06/28/2023  - Supine Lower Trunk Rotation  - 1 x daily - 7 x weekly - 3 sets - 10 reps - 5 seconds hold  - Supine  Posterior Pelvic Tilt  - 1 x daily - 7 x weekly - 3 sets - 10 reps - 10 seconds hold  - Forward T with Counter Support  - 1 x daily - 7 x weekly - 2-3 sets - 10 reps    SLS with contralateral UE assist and with contralateral LE hip flexion and extension   R 1 minute x 3  - Quadruped Bent Leg Hip Extension  - 2 x daily - 7 x weekly - 1 sets - 10 reps - 5 seconds hold  Standing PNF chops to the R   Green band 10x5 seconds for 3 sets  - Shoulder Adduction with Anchored Resistance  - 1 x daily - 7 x weekly - 3 sets - 10 reps - 5 seconds hold  Green band.   L to R pressure to L lumbar convexity with gray band    With L trunk side bend   in sitting 10x5 seconds   Discontinued on 06/28/2023   - Bird Dog  - 1 x daily - 7 x weekly - 3 sets - 10 reps - Bilateral Bent Leg Lift  - 1 x daily - 3-4 x weekly - 6 sets - 5 reps   ASSESSMENT:  CLINICAL IMPRESSION: Continued challenging her trunk and glute muscles to improve strength and decrease lumbar extension stress during standing activities. Pt tolerated session well without aggravation of symptoms. Pt will benefit from continued skilled physical therapy services to decrease pain, improve strength and function.     OBJECTIVE IMPAIRMENTS: difficulty walking, decreased ROM, decreased strength, improper body mechanics, postural dysfunction, and pain.   ACTIVITY LIMITATIONS: standing and locomotion level  PARTICIPATION LIMITATIONS:   PERSONAL FACTORS: Age, Fitness, Time since onset of injury/illness/exacerbation, and 1-2 comorbidities: Osteopenia, arthritis, skin CA are also affecting patient's functional outcome.   REHAB POTENTIAL: Fair    CLINICAL DECISION MAKING: Stable/uncomplicated  EVALUATION COMPLEXITY: Low   GOALS: Goals reviewed with patient? Yes  SHORT TERM GOALS:  Target date: 06/02/2023  Pt will be independent with her initial HEP to improve posture, strength, function, and ability to perform standing tasks and ambulate longer distances more comfortably for her back.  Baseline: Pt has started her initial HEP. (05/17/2023); No questions with her HEP (06/20/2023) Goal status: MET      LONG TERM GOALS: Target date: 07/14/2023  Pt will have a decrease in low back pain to 2/10 or less at worst to promote ability to ambulate longer distances, perform standing tasks, and play tennis more comfortably for her back.  Baseline: L low back pain. 5/10 at worst for the past 3 months  (05/17/2023); 4/10 random pain at worst for the past 7 days (06/12/2023); 3-4/10 random back pain. (06/20/2023)  Goal status: ONGOING  2.  Pt will improve B hip extension and abduction strength by at least 1/2 MMT grade to promote ability to perform standing tasks as well as ambulate with less low back pain.  Baseline:  MMT Right eval Left eval R ( 06/20/2023) L (06/20/2023)  Hip extension 4 with lumbar extension compensation 4 with lumbar extension compensation 4 4  Hip abduction 4 4 4+ 4+   (05/17/2023)   Goal status: PARTIALLY MET  3.  Pt will improve B hip IR ROM by at least 10 degrees to promote ability to ambulate longer distances more comfortably for her back.  Baseline:  Passive  Right eval Left eval R (06/20/2023) L (06/20/2023)  Hip internal rotation 22 degrees  18 degrees 28  degrees  30 degrees   (05/17/2023)  Goal status: PARTIALLY MET   4.  Pt will improve her Modified Oswestry Low Back Pain disability Questionnaire by at least 10% as a demonstration of improved function.  Baseline: 13/50 (26%) (05/17/2023); 8/50 (16%) (06/20/2023) Goal status: PARTIALLY MET    PLAN:  PT FREQUENCY: 1-2x/week  PT DURATION: 8 weeks  PLANNED INTERVENTIONS: 97110-Therapeutic exercises, 97530- Therapeutic activity, [id number]- Neuromuscular re-education, 97535- Self Care, [id number]-  Manual therapy, G0283- Electrical stimulation (unattended), 22025- Ionotophoresis 4mg /ml Dexamethasone, Patient/Family education, and Dry Needling.  PLAN FOR NEXT SESSION: Posture, trunk and hip strengthening, hip ROM, manual techniques, modalities PRN   Kyshawn Teal, PT, DPT 07/03/2023, 11:20 AM

## 2023-07-06 ENCOUNTER — Ambulatory Visit

## 2023-07-06 DIAGNOSIS — M5459 Other low back pain: Secondary | ICD-10-CM

## 2023-07-06 NOTE — Therapy (Signed)
 OUTPATIENT PHYSICAL THERAPY TREATMENT    Patient Name: TALAYSHA FREEBERG MRN: [id number] DOB:[birth year], 78 y.o., female Today's Date: 07/06/2023  END OF SESSION:  PT End of Session - 07/06/23 1034     Visit Number 15    Number of Visits 17    Date for PT Re-Evaluation 07/14/23    Progress Note Due on Visit 20    PT Start Time 1034    PT Stop Time 1112    PT Time Calculation (min) 38 min    Activity Tolerance Patient tolerated treatment well    Behavior During Therapy [facility] for tasks assessed/performed                      Past Medical History:  Diagnosis Date   Arthritis    right knee, right thumb   Bright red rectal bleeding 09/01/2021   Cancer (HCC)    skin   COVID-19 12/31/2020   ENDOMETRIOSIS 10/04/2006   Qualifier: History of  By: Roxine Cordia MD, Candie Chamber    Suprapubic pressure 09/01/2021   Past Surgical History:  Procedure Laterality Date   ACHILLES TENDON REPAIR  06/14/2004   CATARACT EXTRACTION W/PHACO Right 11/09/2022   Procedure: CATARACT EXTRACTION PHACO AND INTRAOCULAR LENS PLACEMENT (IOC) RIGHT TORIC 7.96 00:48.4;  Surgeon: Annell Kidney, MD;  Location: [facility];  Service: Ophthalmology;  Laterality: Right;   CATARACT EXTRACTION W/PHACO Left 12/07/2022   Procedure: CATARACT EXTRACTION PHACO AND INTRAOCULAR LENS PLACEMENT (IOC) LEFT  CLAREON VIVITY TORIC LENS 7.43 00:49.5;  Surgeon: Annell Kidney, MD;  Location: [facility];  Service: Ophthalmology;  Laterality: Left;   CESAREAN SECTION     placenta previa   CHOLECYSTECTOMY  06/13/2001   COLONOSCOPY WITH PROPOFOL  N/A 09/24/2021   Procedure: COLONOSCOPY WITH PROPOFOL ;  Surgeon: Selena Daily, MD;  Location: [facility];  Service: Gastroenterology;  Laterality: N/A;   Patient Active Problem List   Diagnosis Date Noted   Adjustment reaction with anxiety and depression 03/01/2023   Osteoarthritis 02/17/2022   Encounter for annual general medical examination  with abnormal findings in adult 02/17/2022   Arthritis of carpometacarpal [facility]) joint of right thumb 12/28/2021   Polyp of cecum    Unilateral primary osteoarthritis, right knee 09/22/2021   Screen for colon cancer 01/11/2016   Insomnia 01/01/2014   HYPERCHOLESTEROLEMIA 10/04/2006    PCP: Gabriel John, NP   REFERRING PROVIDER: Arnie Lao MD  REFERRING DIAG: Low back pain, DDD  Rationale for Evaluation and Treatment: Rehabilitation  THERAPY DIAG:  Other low back pain  ONSET DATE: 5-6 years ago  SUBJECTIVE:  SUBJECTIVE STATEMENT: Played tennis yesterday, went pretty good. Played for the whole time, 1.5 hours. Was good about sitting down when she needed to. Was fine afterwards as well. Back started hurting on the R side Tuesday after driving to [place]. Has not bothered her since then.        PERTINENT HISTORY:  Low back pain.  Pt was playing tennis 6 years ago. Pt was trying to arch her back to get a better tennis serve. After a while pain worsened. Pt then went to a chiropractor but also rested. Got an x-ray which revealed scoliosis on L side. The pain would take her breath away. Sitting and resting helps. Has been dealing with her back pain ever since. Has not hurt that bad again. Pt also has R dorsal foot numbness, got an MRI for her low back.    No latex allergies No blood pressure problems per pt.  Has osteopenia/osteoporosis   PAIN:  Are you having pain? Yes: NPRS scale: 0/10 Pain location: L low back Pain description: sharp Aggravating factors: Lunging to the R or L while playing tennis; serving at tennis (does not do that anymore), walking about 15 minutes; standing for 30 minutes Relieving factors: sitting for 5 minutes   PRECAUTIONS: Osteopenia per bone  density on 04/13/2023  RED FLAGS: Bowel or bladder incontinence: No and Cauda equina syndrome: No   WEIGHT BEARING RESTRICTIONS: No  FALLS:  Has patient fallen in last 6 months? No  LIVING ENVIRONMENT: Lives with: lives with their spouse Lives in: House/apartment Stairs: No Has following equipment at home: None  OCCUPATION: retired  PLOF: Independent  PATIENT GOALS: Get her muscles strong enough so the degeneration does not get worse.   NEXT MD VISIT: None yet  OBJECTIVE:  Note: Objective measures were completed at Evaluation unless otherwise noted.  DIAGNOSTIC FINDINGS:  MR Lumbar Spine w/o contrast 03/31/2023  Narrative & Impression  CLINICAL DATA:  78 year old female with 4 months of low back pain. Numbness at the top of the right foot.   EXAM: MRI LUMBAR SPINE WITHOUT CONTRAST   TECHNIQUE: Multiplanar, multisequence MR imaging of the lumbar spine was performed. No intravenous contrast was administered.   COMPARISON:  None Available.   FINDINGS: Segmentation: Lumbar segmentation appears to be normal and will be designated as such for this report.   Alignment: Levoconvex lumbar scoliosis is mild-to-moderate, apex at L3-L4. Associated straightening of lumbar lordosis.   Vertebrae: Normal background bone marrow signal. Widespread lumbar degenerative endplate spurring. Mostly chronic degenerative endplate marrow signal changes associated. Faint superimposed endplate marrow edema such as at L2-L3 and L3-L4 on series 109, image 7. Intact visible sacrum and SI joints. Incidental small S2-S3 sacral Tarlov cysts (normal variant).   Conus medullaris and cauda equina: Conus extends to the L1-L2 level. No lower spinal cord or conus signal abnormality. Generally normal cauda equina nerve roots.   Paraspinal and other soft tissues: Negative.   Disc levels:   T11-T12 and   T12-L1:  Negative.   L1-L2: Disc desiccation and disc space loss. Disc bulging  asymmetric to the left. No spinal or convincing lateral recess stenosis. Mild mostly left side L1 foraminal stenosis.   L2-L3: Severe disc space loss. Circumferential disc osteophyte complex. Mild facet and ligament flavum hypertrophy greater on the right. No spinal or convincing lateral recess stenosis. Mild more so than moderate bilateral L2 neural foraminal stenosis.   L3-L4: Similar disc space loss. Circumferential disc bulging and endplate spurring with asymmetric left foraminal component  of disc on series 7, image 13. Mild facet and ligament flavum hypertrophy. No spinal or lateral recess stenosis. Moderate left and mild to moderate right L3 neural foraminal stenosis.   L4-L5: Severe disc space loss. Circumferential disc osteophyte complex. Mild to moderate facet and ligament flavum hypertrophy greater on the left. No significant spinal stenosis. Mild left lateral recess stenosis (descending left L5 nerve level series 115, image 27). Moderate left and moderate to severe right (series 7, image 4) L4 neural foraminal stenosis.   L5-S1: Better preserved disc space. Circumferential disc bulging is asymmetric to the left. Moderate facet and ligament flavum hypertrophy greater on the left. Degenerative left facet joint fluid. No spinal stenosis, but moderate left lateral recess stenosis (descending left L5 nerve level series 115, image 32). Severe left L5 neural foraminal stenosis (series 7, image 11). Only borderline to mild right foraminal stenosis.   IMPRESSION: 1. Advanced lumbar spine disc and endplate degeneration in the setting of mild to moderate levoconvex scoliosis. Faint degenerative mid lumbar endplate marrow edema. 2. No significant lumbar spinal stenosis due to capacious underlying spinal canal. Right side neural impingement maximal at the right L4 neural foramina, multifactorial. Multilevel moderate and occasionally severe (left L5 nerve level) left side lateral  recess and foraminal stenosis.     Electronically Signed   By: Marlise Simpers M.D.   On: 04/17/2023 08:34     PATIENT SURVEYS:  Modified Oswestry 13/50 (26%)   COGNITION: Overall cognitive status: Within functional limits for tasks assessed     SENSATION: WFL  MUSCLE LENGTH:   POSTURE: forward neck, movement preference around C5/C6 area, and C3/C4 area, B protracted shoulders, R shoulder higher, thoracic kyphosis, R lateral shift   R  convexity at thoracolumbar junction L lumbar convexity around L2,3,4 area L lumbar rotation  B genu valgus R > L   Improved low back comfort level with manual posturral correction from PT  Prone position: L posterior pelvic rotation    PALPATION: TTP L SI joint area.  L ASIS more posterior compared to R   LUMBAR ROM:   AROM eval  Flexion WFL  Extension WFL with R lumbar rotation  Right lateral flexion WFL  Left lateral flexion WFL  Right rotation ful  Left rotation ful     Lumbar extension with R rotation WFL  Lumbar extension with L rotation  WFL with slight symptoms   (Blank rows = not tested)   LOWER EXTREMITY ROM:     Passive  Right eval Left eval R (06/28/2023) L (06/28/2023)  Hip flexion      Hip extension   12 12  Hip abduction      Hip adduction      Hip internal rotation 22 degrees  18 degrees    Hip external rotation  [facility]    Knee flexion      Knee extension      Ankle dorsiflexion      Ankle plantarflexion      Ankle inversion      Ankle eversion       (Blank rows = not tested)  LOWER EXTREMITY MMT:    MMT Right eval Left eval R (06/28/2023) L (06/28/2023)  Hip flexion 4 4    Hip extension 4 with lumbar extension compensation 4 with lumbar extension compensation 4 4  Hip abduction 4 4    Hip adduction      Hip internal rotation      Hip external rotation  Knee flexion 5 5    Knee extension 5 5    Ankle dorsiflexion      Ankle plantarflexion      Ankle inversion      Ankle eversion       (Blank  rows = not tested)  LUMBAR SPECIAL TESTS:  (-) repeated flexion test Long sit test suggests posterior nutation of L innominate.    FUNCTIONAL TESTS:    GAIT: Distance walked: 60 ft Assistive device utilized: None Level of assistance: Complete Independence Comments: decreased stance L LE, B pelvic drop  TREATMENT DATE: 07/06/2023                                                                                                                                Therapeutic exercise  Standing hip flexor stretch at stair step   L 30 seconds x 3  R 30 seconds x 3   Standing hip machine height 5  Hip extension    L plate 40 for [id number]   R plate 40 for [id number]       Hip abduction                          R plate 40 for  5x5 seconds for 2 sets                         L plate 40 for  5x5 seconds for 2 sets  Side stepping with green band around knees 30 ft to the R and 30 ft to the L while mainting mini squat     Improved exercise technique, movement at target joints, use of target muscles after min to mod verbal, visual, tactile cues.    Neuromuscular re education    Crunch 5x5 seconds for 4 sets  Mini Bridge with posterior pelvic tilt 10x5 seconds for 2 sets   Posterior pelvic tilt with march 10x2 each LE alternating      Improved technique, movement at target joints, use of target muscles after mod verbal, visual, tactile cues.      PATIENT EDUCATION:  Education details: there-ex, HEP Person educated: Patient Education method: Explanation, Demonstration, Tactile cues, Verbal cues, and Handouts Education comprehension: verbalized understanding and returned demonstration  HOME EXERCISE PROGRAM: Access Code: [email or web address] URL: [email or web address] Date: 05/17/2023 Prepared by: Suzzane Estes  Exercises - Supine Piriformis Stretch with Leg Straight  - 3 x daily - 7 x weekly - 1 sets - 5 reps - 30 seconds  hold - Seated Hip Internal Rotation AROM  - 1 x daily  - 7 x weekly - 3 sets - 10 reps - 5 seconds hold - Right Standing Lateral Shift Correction at Wall - Repetitions  - 1 x daily - 7 x weekly - 3 sets - 10 reps - 5 seconds hold  - Standing Hip Abduction with Resistance at Ankles and Counter  Support  - 1 x daily - 7 x weekly - 3 sets - 10 reps  Yellow band  Red band upgrade on 06/05/2023  - Supine March with Posterior Pelvic Tilt  - 1 x daily - 7 x weekly - 3 sets - 10 reps  Replaced with reverse crunch on 06/28/2023  - Supine Lower Trunk Rotation  - 1 x daily - 7 x weekly - 3 sets - 10 reps - 5 seconds hold  - Supine Posterior Pelvic Tilt  - 1 x daily - 7 x weekly - 3 sets - 10 reps - 10 seconds hold  - Forward T with Counter Support  - 1 x daily - 7 x weekly - 2-3 sets - 10 reps    SLS with contralateral UE assist and with contralateral LE hip flexion and extension   R 1 minute x 3  - Quadruped Bent Leg Hip Extension  - 2 x daily - 7 x weekly - 1 sets - 10 reps - 5 seconds hold  Standing PNF chops to the R   Green band 10x5 seconds for 3 sets  - Shoulder Adduction with Anchored Resistance  - 1 x daily - 7 x weekly - 3 sets - 10 reps - 5 seconds hold  Green band.   L to R pressure to L lumbar convexity with gray band    With L trunk side bend   in sitting 10x5 seconds   Discontinued on 06/28/2023   - Bird Dog  - 1 x daily - 7 x weekly - 3 sets - 10 reps - Bilateral Bent Leg Lift  - 1 x daily - 3-4 x weekly - 6 sets - 5 reps   ASSESSMENT:  CLINICAL IMPRESSION: Improved ability to play tennis more comfortably longer based on subjective reports. Continued challenging her trunk and glute muscles to improve strength and decrease lumbar extension stress during standing activities. Pt tolerated session well without aggravation of symptoms. Pt will benefit from continued skilled physical therapy services to decrease pain, improve strength and function.     OBJECTIVE IMPAIRMENTS: difficulty walking, decreased ROM, decreased strength,  improper body mechanics, postural dysfunction, and pain.   ACTIVITY LIMITATIONS: standing and locomotion level  PARTICIPATION LIMITATIONS:   PERSONAL FACTORS: Age, Fitness, Time since onset of injury/illness/exacerbation, and 1-2 comorbidities: Osteopenia, arthritis, skin CA are also affecting patient's functional outcome.   REHAB POTENTIAL: Fair    CLINICAL DECISION MAKING: Stable/uncomplicated  EVALUATION COMPLEXITY: Low   GOALS: Goals reviewed with patient? Yes  SHORT TERM GOALS: Target date: 06/02/2023  Pt will be independent with her initial HEP to improve posture, strength, function, and ability to perform standing tasks and ambulate longer distances more comfortably for her back.  Baseline: Pt has started her initial HEP. (05/17/2023); No questions with her HEP (06/20/2023) Goal status: MET      LONG TERM GOALS: Target date: 07/14/2023  Pt will have a decrease in low back pain to 2/10 or less at worst to promote ability to ambulate longer distances, perform standing tasks, and play tennis more comfortably for her back.  Baseline: L low back pain. 5/10 at worst for the past 3 months  (05/17/2023); 4/10 random pain at worst for the past 7 days (06/12/2023); 3-4/10 random back pain. (06/20/2023)  Goal status: ONGOING  2.  Pt will improve B hip extension and abduction strength by at least 1/2 MMT grade to promote ability to perform standing tasks as well as ambulate with less low back  pain.  Baseline:  MMT Right eval Left eval R ( 06/20/2023) L (06/20/2023)  Hip extension 4 with lumbar extension compensation 4 with lumbar extension compensation 4 4  Hip abduction 4 4 4+ 4+   (05/17/2023)   Goal status: PARTIALLY MET  3.  Pt will improve B hip IR ROM by at least 10 degrees to promote ability to ambulate longer distances more comfortably for her back.  Baseline:  Passive  Right eval Left eval R (06/20/2023) L (06/20/2023)  Hip internal rotation 22 degrees  18 degrees 28  degrees  30 degrees   (05/17/2023)  Goal status: PARTIALLY MET   4.  Pt will improve her Modified Oswestry Low Back Pain disability Questionnaire by at least 10% as a demonstration of improved function.  Baseline: 13/50 (26%) (05/17/2023); 8/50 (16%) (06/20/2023) Goal status: PARTIALLY MET    PLAN:  PT FREQUENCY: 1-2x/week  PT DURATION: 8 weeks  PLANNED INTERVENTIONS: 97110-Therapeutic exercises, 97530- Therapeutic activity, [id number]- Neuromuscular re-education, 97535- Self Care, 16109- Manual therapy, [id number]- Electrical stimulation (unattended), [id number]- Ionotophoresis 4mg /ml Dexamethasone, Patient/Family education, and Dry Needling.  PLAN FOR NEXT SESSION: Posture, trunk and hip strengthening, hip ROM, manual techniques, modalities PRN   Tonee Silverstein, PT, DPT 07/06/2023, 11:13 AM

## 2023-07-10 ENCOUNTER — Ambulatory Visit

## 2023-07-10 DIAGNOSIS — M5459 Other low back pain: Secondary | ICD-10-CM

## 2023-07-10 NOTE — Therapy (Signed)
 OUTPATIENT PHYSICAL THERAPY TREATMENT    Patient Name: Natalie Sosa MRN: [id number] DOB:October 19, 1945, 78 y.o., female Today's Date: 07/10/2023  END OF SESSION:  PT End of Session - 07/10/23 1015     Visit Number 16    Number of Visits 17    Date for PT Re-Evaluation 07/14/23    Progress Note Due on Visit 20    PT Start Time 1016    PT Stop Time 1056    PT Time Calculation (min) 40 min    Activity Tolerance Patient tolerated treatment well    Behavior During Therapy [facility] for tasks assessed/performed                       Past Medical History:  Diagnosis Date   Arthritis    right knee, right thumb   Bright red rectal bleeding 09/01/2021   Cancer (HCC)    skin   COVID-19 12/31/2020   ENDOMETRIOSIS 10/04/2006   Qualifier: History of  By: Roxine Cordia MD, Candie Chamber    Suprapubic pressure 09/01/2021   Past Surgical History:  Procedure Laterality Date   ACHILLES TENDON REPAIR  06/14/2004   CATARACT EXTRACTION W/PHACO Right 11/09/2022   Procedure: CATARACT EXTRACTION PHACO AND INTRAOCULAR LENS PLACEMENT (IOC) RIGHT TORIC 7.96 00:48.4;  Surgeon: Annell Kidney, MD;  Location: [facility];  Service: Ophthalmology;  Laterality: Right;   CATARACT EXTRACTION W/PHACO Left 12/07/2022   Procedure: CATARACT EXTRACTION PHACO AND INTRAOCULAR LENS PLACEMENT (IOC) LEFT  CLAREON VIVITY TORIC LENS 7.43 00:49.5;  Surgeon: Annell Kidney, MD;  Location: [facility];  Service: Ophthalmology;  Laterality: Left;   CESAREAN SECTION     placenta previa   CHOLECYSTECTOMY  06/13/2001   COLONOSCOPY WITH PROPOFOL  N/A 09/24/2021   Procedure: COLONOSCOPY WITH PROPOFOL ;  Surgeon: Selena Daily, MD;  Location: [facility];  Service: Gastroenterology;  Laterality: N/A;   Patient Active Problem List   Diagnosis Date Noted   Adjustment reaction with anxiety and depression 03/01/2023   Osteoarthritis 02/17/2022   Encounter for annual general medical examination  with abnormal findings in adult 02/17/2022   Arthritis of carpometacarpal [facility]) joint of right thumb 12/28/2021   Polyp of cecum    Unilateral primary osteoarthritis, right knee 09/22/2021   Screen for colon cancer 01/11/2016   Insomnia 01/01/2014   HYPERCHOLESTEROLEMIA 10/04/2006    PCP: Gabriel John, NP   REFERRING PROVIDER: Arnie Lao MD  REFERRING DIAG: Low back pain, DDD  Rationale for Evaluation and Treatment: Rehabilitation  THERAPY DIAG:  Other low back pain  ONSET DATE: 5-6 years ago  SUBJECTIVE:  SUBJECTIVE STATEMENT: Back is fine. Leaned over and pruned 5 bushes and her back was ok.           PERTINENT HISTORY:  Low back pain.  Pt was playing tennis 6 years ago. Pt was trying to arch her back to get a better tennis serve. After a while pain worsened. Pt then went to a chiropractor but also rested. Got an x-ray which revealed scoliosis on L side. The pain would take her breath away. Sitting and resting helps. Has been dealing with her back pain ever since. Has not hurt that bad again. Pt also has R dorsal foot numbness, got an MRI for her low back.    No latex allergies No blood pressure problems per pt.  Has osteopenia/osteoporosis   PAIN:  Are you having pain? Yes: NPRS scale: 0/10 Pain location: L low back Pain description: sharp Aggravating factors: Lunging to the R or L while playing tennis; serving at tennis (does not do that anymore), walking about 15 minutes; standing for 30 minutes Relieving factors: sitting for 5 minutes   PRECAUTIONS: Osteopenia per bone density on 04/13/2023  RED FLAGS: Bowel or bladder incontinence: No and Cauda equina syndrome: No   WEIGHT BEARING RESTRICTIONS: No  FALLS:  Has patient fallen in last 6 months?  No  LIVING ENVIRONMENT: Lives with: lives with their spouse Lives in: House/apartment Stairs: No Has following equipment at home: None  OCCUPATION: retired  PLOF: Independent  PATIENT GOALS: Get her muscles strong enough so the degeneration does not get worse.   NEXT MD VISIT: None yet  OBJECTIVE:  Note: Objective measures were completed at Evaluation unless otherwise noted.  DIAGNOSTIC FINDINGS:  MR Lumbar Spine w/o contrast 03/31/2023  Narrative & Impression  CLINICAL DATA:  78 year old female with 4 months of low back pain. Numbness at the top of the right foot.   EXAM: MRI LUMBAR SPINE WITHOUT CONTRAST   TECHNIQUE: Multiplanar, multisequence MR imaging of the lumbar spine was performed. No intravenous contrast was administered.   COMPARISON:  None Available.   FINDINGS: Segmentation: Lumbar segmentation appears to be normal and will be designated as such for this report.   Alignment: Levoconvex lumbar scoliosis is mild-to-moderate, apex at L3-L4. Associated straightening of lumbar lordosis.   Vertebrae: Normal background bone marrow signal. Widespread lumbar degenerative endplate spurring. Mostly chronic degenerative endplate marrow signal changes associated. Faint superimposed endplate marrow edema such as at L2-L3 and L3-L4 on series 109, image 7. Intact visible sacrum and SI joints. Incidental small S2-S3 sacral Tarlov cysts (normal variant).   Conus medullaris and cauda equina: Conus extends to the L1-L2 level. No lower spinal cord or conus signal abnormality. Generally normal cauda equina nerve roots.   Paraspinal and other soft tissues: Negative.   Disc levels:   T11-T12 and   T12-L1:  Negative.   L1-L2: Disc desiccation and disc space loss. Disc bulging asymmetric to the left. No spinal or convincing lateral recess stenosis. Mild mostly left side L1 foraminal stenosis.   L2-L3: Severe disc space loss. Circumferential disc osteophyte complex.  Mild facet and ligament flavum hypertrophy greater on the right. No spinal or convincing lateral recess stenosis. Mild more so than moderate bilateral L2 neural foraminal stenosis.   L3-L4: Similar disc space loss. Circumferential disc bulging and endplate spurring with asymmetric left foraminal component of disc on series 7, image 13. Mild facet and ligament flavum hypertrophy. No spinal or lateral recess stenosis. Moderate left and mild to moderate right L3 neural  foraminal stenosis.   L4-L5: Severe disc space loss. Circumferential disc osteophyte complex. Mild to moderate facet and ligament flavum hypertrophy greater on the left. No significant spinal stenosis. Mild left lateral recess stenosis (descending left L5 nerve level series 115, image 27). Moderate left and moderate to severe right (series 7, image 4) L4 neural foraminal stenosis.   L5-S1: Better preserved disc space. Circumferential disc bulging is asymmetric to the left. Moderate facet and ligament flavum hypertrophy greater on the left. Degenerative left facet joint fluid. No spinal stenosis, but moderate left lateral recess stenosis (descending left L5 nerve level series 115, image 32). Severe left L5 neural foraminal stenosis (series 7, image 11). Only borderline to mild right foraminal stenosis.   IMPRESSION: 1. Advanced lumbar spine disc and endplate degeneration in the setting of mild to moderate levoconvex scoliosis. Faint degenerative mid lumbar endplate marrow edema. 2. No significant lumbar spinal stenosis due to capacious underlying spinal canal. Right side neural impingement maximal at the right L4 neural foramina, multifactorial. Multilevel moderate and occasionally severe (left L5 nerve level) left side lateral recess and foraminal stenosis.     Electronically Signed   By: Marlise Simpers M.D.   On: 04/17/2023 08:34     PATIENT SURVEYS:  Modified Oswestry 13/50 (26%)   COGNITION: Overall cognitive  status: Within functional limits for tasks assessed     SENSATION: WFL  MUSCLE LENGTH:   POSTURE: forward neck, movement preference around C5/C6 area, and C3/C4 area, B protracted shoulders, R shoulder higher, thoracic kyphosis, R lateral shift   R  convexity at thoracolumbar junction L lumbar convexity around L2,3,4 area L lumbar rotation  B genu valgus R > L   Improved low back comfort level with manual posturral correction from PT  Prone position: L posterior pelvic rotation    PALPATION: TTP L SI joint area.  L ASIS more posterior compared to R   LUMBAR ROM:   AROM eval  Flexion WFL  Extension WFL with R lumbar rotation  Right lateral flexion WFL  Left lateral flexion WFL  Right rotation ful  Left rotation ful     Lumbar extension with R rotation WFL  Lumbar extension with L rotation  WFL with slight symptoms   (Blank rows = not tested)   LOWER EXTREMITY ROM:     Passive  Right eval Left eval R (06/28/2023) L (06/28/2023)  Hip flexion      Hip extension   12 12  Hip abduction      Hip adduction      Hip internal rotation 22 degrees  18 degrees    Hip external rotation  [facility]    Knee flexion      Knee extension      Ankle dorsiflexion      Ankle plantarflexion      Ankle inversion      Ankle eversion       (Blank rows = not tested)  LOWER EXTREMITY MMT:    MMT Right eval Left eval R (06/28/2023) L (06/28/2023)  Hip flexion 4 4    Hip extension 4 with lumbar extension compensation 4 with lumbar extension compensation 4 4  Hip abduction 4 4    Hip adduction      Hip internal rotation      Hip external rotation      Knee flexion 5 5    Knee extension 5 5    Ankle dorsiflexion      Ankle plantarflexion  Ankle inversion      Ankle eversion       (Blank rows = not tested)  LUMBAR SPECIAL TESTS:  (-) repeated flexion test Long sit test suggests posterior nutation of L innominate.    FUNCTIONAL TESTS:    GAIT: Distance walked: 60  ft Assistive device utilized: None Level of assistance: Complete Independence Comments: decreased stance L LE, B pelvic drop  TREATMENT DATE: 07/10/2023                                                                                                                                Therapeutic exercise  Standing hip flexor stretch at stair step   L 30 seconds x 3  R 30 seconds x 3   Standing hip machine height 5  Hip extension    L plate 55 for [id number]   R plate 55 for [id number]       Hip abduction                          R plate 40 for  5x5 seconds for 3 sets                         L plate 40 for  5x5 seconds for 3 sets   Side stepping with green band around knees 30 ft to the R and 30 ft to the L while mainting mini squat  for 2 sets   Improved exercise technique, movement at target joints, use of target muscles after min to mod verbal, visual, tactile cues.    Neuromuscular re education  Mini Bridge with posterior pelvic tilt 10x5 seconds for 3 sets  Makes her back feel good per pt  Crunch 5x5 seconds for 4 sets  Posterior pelvic tilt with march 10x3 each LE alternating      Improved technique, movement at target joints, use of target muscles after mod verbal, visual, tactile cues.      PATIENT EDUCATION:  Education details: there-ex, HEP Person educated: Patient Education method: Explanation, Demonstration, Tactile cues, Verbal cues, and Handouts Education comprehension: verbalized understanding and returned demonstration  HOME EXERCISE PROGRAM: Access Code: [email or web address] URL: [email or web address] Date: 05/17/2023 Prepared by: Suzzane Estes  Exercises - Supine Piriformis Stretch with Leg Straight  - 3 x daily - 7 x weekly - 1 sets - 5 reps - 30 seconds  hold - Seated Hip Internal Rotation AROM  - 1 x daily - 7 x weekly - 3 sets - 10 reps - 5 seconds hold - Right Standing Lateral Shift Correction at Wall - Repetitions  - 1 x daily - 7 x weekly - 3  sets - 10 reps - 5 seconds hold  - Standing Hip Abduction with Resistance at Ankles and Counter Support  - 1 x daily - 7 x weekly - 3 sets - 10 reps  Yellow band  Red band upgrade on 06/05/2023  - Supine March with Posterior Pelvic Tilt  - 1 x daily - 7 x weekly - 3 sets - 10 reps  Replaced with reverse crunch on 06/28/2023  - Supine Lower Trunk Rotation  - 1 x daily - 7 x weekly - 3 sets - 10 reps - 5 seconds hold  - Supine Posterior Pelvic Tilt  - 1 x daily - 7 x weekly - 3 sets - 10 reps - 10 seconds hold  - Forward T with Counter Support  - 1 x daily - 7 x weekly - 2-3 sets - 10 reps    SLS with contralateral UE assist and with contralateral LE hip flexion and extension   R 1 minute x 3  - Quadruped Bent Leg Hip Extension  - 2 x daily - 7 x weekly - 1 sets - 10 reps - 5 seconds hold  Standing PNF chops to the R   Green band 10x5 seconds for 3 sets  - Shoulder Adduction with Anchored Resistance  - 1 x daily - 7 x weekly - 3 sets - 10 reps - 5 seconds hold  Green band.   L to R pressure to L lumbar convexity with gray band    With L trunk side bend   in sitting 10x5 seconds   Discontinued on 06/28/2023   - Bird Dog  - 1 x daily - 7 x weekly - 3 sets - 10 reps - Bilateral Bent Leg Lift  - 1 x daily - 3-4 x weekly - 6 sets - 5 reps   ASSESSMENT:  CLINICAL IMPRESSION: Improving ability to perform standing bent over tasks without back pain based on subjective reports. Continued challenging her trunk and glute muscles to improve strength and decrease lumbar extension stress during standing activities. Pt tolerated session well without aggravation of symptoms. Pt will benefit from continued skilled physical therapy services to decrease pain, improve strength and function.     OBJECTIVE IMPAIRMENTS: difficulty walking, decreased ROM, decreased strength, improper body mechanics, postural dysfunction, and pain.   ACTIVITY LIMITATIONS: standing and locomotion level  PARTICIPATION  LIMITATIONS:   PERSONAL FACTORS: Age, Fitness, Time since onset of injury/illness/exacerbation, and 1-2 comorbidities: Osteopenia, arthritis, skin CA are also affecting patient's functional outcome.   REHAB POTENTIAL: Fair    CLINICAL DECISION MAKING: Stable/uncomplicated  EVALUATION COMPLEXITY: Low   GOALS: Goals reviewed with patient? Yes  SHORT TERM GOALS: Target date: 06/02/2023  Pt will be independent with her initial HEP to improve posture, strength, function, and ability to perform standing tasks and ambulate longer distances more comfortably for her back.  Baseline: Pt has started her initial HEP. (05/17/2023); No questions with her HEP (06/20/2023) Goal status: MET      LONG TERM GOALS: Target date: 07/14/2023  Pt will have a decrease in low back pain to 2/10 or less at worst to promote ability to ambulate longer distances, perform standing tasks, and play tennis more comfortably for her back.  Baseline: L low back pain. 5/10 at worst for the past 3 months  (05/17/2023); 4/10 random pain at worst for the past 7 days (06/12/2023); 3-4/10 random back pain. (06/20/2023)  Goal status: ONGOING  2.  Pt will improve B hip extension and abduction strength by at least 1/2 MMT grade to promote ability to perform standing tasks as well as ambulate with less low back pain.  Baseline:  MMT Right eval Left eval R ( 06/20/2023) L (06/20/2023)  Hip extension  4 with lumbar extension compensation 4 with lumbar extension compensation 4 4  Hip abduction 4 4 4+ 4+   (05/17/2023)   Goal status: PARTIALLY MET  3.  Pt will improve B hip IR ROM by at least 10 degrees to promote ability to ambulate longer distances more comfortably for her back.  Baseline:  Passive  Right eval Left eval R (06/20/2023) L (06/20/2023)  Hip internal rotation 22 degrees  18 degrees 28 degrees  30 degrees   (05/17/2023)  Goal status: PARTIALLY MET   4.  Pt will improve her Modified Oswestry Low Back Pain  disability Questionnaire by at least 10% as a demonstration of improved function.  Baseline: 13/50 (26%) (05/17/2023); 8/50 (16%) (06/20/2023) Goal status: PARTIALLY MET    PLAN:  PT FREQUENCY: 1-2x/week  PT DURATION: 8 weeks  PLANNED INTERVENTIONS: 97110-Therapeutic exercises, 97530- Therapeutic activity, 97112- Neuromuscular re-education, 97535- Self Care, 16109- Manual therapy, [id number]- Electrical stimulation (unattended), [phone]- Ionotophoresis 4mg /ml Dexamethasone, Patient/Family education, and Dry Needling.  PLAN FOR NEXT SESSION: Posture, trunk and hip strengthening, hip ROM, manual techniques, modalities PRN   Julie-Anne Torain, PT, DPT 07/10/2023, 11:08 AM

## 2023-07-13 ENCOUNTER — Ambulatory Visit

## 2023-07-13 DIAGNOSIS — M5459 Other low back pain: Secondary | ICD-10-CM | POA: Diagnosis not present

## 2023-07-13 NOTE — Therapy (Signed)
 OUTPATIENT PHYSICAL THERAPY TREATMENT And Discharge Summary    Patient Name: Natalie Sosa MRN: [id number] DOB:06-03-1945, 78 y.o., female Today's Date: 07/13/2023  END OF SESSION:  PT End of Session - 07/13/23 1024     Visit Number 17    Number of Visits 17    Date for PT Re-Evaluation 07/14/23    Progress Note Due on Visit 20    PT Start Time 1024    PT Stop Time 1106    PT Time Calculation (min) 42 min    Activity Tolerance Patient tolerated treatment well    Behavior During Therapy [facility] for tasks assessed/performed                        Past Medical History:  Diagnosis Date   Arthritis    right knee, right thumb   Bright red rectal bleeding 09/01/2021   Cancer (HCC)    skin   COVID-19 12/31/2020   ENDOMETRIOSIS 10/04/2006   Qualifier: History of  By: Roxine Cordia MD, Candie Chamber    Suprapubic pressure 09/01/2021   Past Surgical History:  Procedure Laterality Date   ACHILLES TENDON REPAIR  06/14/2004   CATARACT EXTRACTION W/PHACO Right 11/09/2022   Procedure: CATARACT EXTRACTION PHACO AND INTRAOCULAR LENS PLACEMENT (IOC) RIGHT TORIC 7.96 00:48.4;  Surgeon: Annell Kidney, MD;  Location: [facility];  Service: Ophthalmology;  Laterality: Right;   CATARACT EXTRACTION W/PHACO Left 12/07/2022   Procedure: CATARACT EXTRACTION PHACO AND INTRAOCULAR LENS PLACEMENT (IOC) LEFT  CLAREON VIVITY TORIC LENS 7.43 00:49.5;  Surgeon: Annell Kidney, MD;  Location: [facility];  Service: Ophthalmology;  Laterality: Left;   CESAREAN SECTION     placenta previa   CHOLECYSTECTOMY  06/13/2001   COLONOSCOPY WITH PROPOFOL  N/A 09/24/2021   Procedure: COLONOSCOPY WITH PROPOFOL ;  Surgeon: Selena Daily, MD;  Location: [facility];  Service: Gastroenterology;  Laterality: N/A;   Patient Active Problem List   Diagnosis Date Noted   Adjustment reaction with anxiety and depression 03/01/2023   Osteoarthritis 02/17/2022   Encounter for annual  general medical examination with abnormal findings in adult 02/17/2022   Arthritis of carpometacarpal [facility]) joint of right thumb 12/28/2021   Polyp of cecum    Unilateral primary osteoarthritis, right knee 09/22/2021   Screen for colon cancer 01/11/2016   Insomnia 01/01/2014   HYPERCHOLESTEROLEMIA 10/04/2006    PCP: Gabriel John, NP   REFERRING PROVIDER: Arnie Lao MD  REFERRING DIAG: Low back pain, DDD  Rationale for Evaluation and Treatment: Rehabilitation  THERAPY DIAG:  Other low back pain  ONSET DATE: 5-6 years ago  SUBJECTIVE:  SUBJECTIVE STATEMENT: Back is ok, no problems.           PERTINENT HISTORY:  Low back pain.  Pt was playing tennis 6 years ago. Pt was trying to arch her back to get a better tennis serve. After a while pain worsened. Pt then went to a chiropractor but also rested. Got an x-ray which revealed scoliosis on L side. The pain would take her breath away. Sitting and resting helps. Has been dealing with her back pain ever since. Has not hurt that bad again. Pt also has R dorsal foot numbness, got an MRI for her low back.    No latex allergies No blood pressure problems per pt.  Has osteopenia/osteoporosis   PAIN:  Are you having pain? Yes: NPRS scale: 0/10 Pain location: L low back Pain description: sharp Aggravating factors: Lunging to the R or L while playing tennis; serving at tennis (does not do that anymore), walking about 15 minutes; standing for 30 minutes Relieving factors: sitting for 5 minutes   PRECAUTIONS: Osteopenia per bone density on 04/13/2023  RED FLAGS: Bowel or bladder incontinence: No and Cauda equina syndrome: No   WEIGHT BEARING RESTRICTIONS: No  FALLS:  Has patient fallen in last 6 months? No  LIVING  ENVIRONMENT: Lives with: lives with their spouse Lives in: House/apartment Stairs: No Has following equipment at home: None  OCCUPATION: retired  PLOF: Independent  PATIENT GOALS: Get her muscles strong enough so the degeneration does not get worse.   NEXT MD VISIT: None yet  OBJECTIVE:  Note: Objective measures were completed at Evaluation unless otherwise noted.  DIAGNOSTIC FINDINGS:  MR Lumbar Spine w/o contrast 03/31/2023  Narrative & Impression  CLINICAL DATA:  78 year old female with 4 months of low back pain. Numbness at the top of the right foot.   EXAM: MRI LUMBAR SPINE WITHOUT CONTRAST   TECHNIQUE: Multiplanar, multisequence MR imaging of the lumbar spine was performed. No intravenous contrast was administered.   COMPARISON:  None Available.   FINDINGS: Segmentation: Lumbar segmentation appears to be normal and will be designated as such for this report.   Alignment: Levoconvex lumbar scoliosis is mild-to-moderate, apex at L3-L4. Associated straightening of lumbar lordosis.   Vertebrae: Normal background bone marrow signal. Widespread lumbar degenerative endplate spurring. Mostly chronic degenerative endplate marrow signal changes associated. Faint superimposed endplate marrow edema such as at L2-L3 and L3-L4 on series 109, image 7. Intact visible sacrum and SI joints. Incidental small S2-S3 sacral Tarlov cysts (normal variant).   Conus medullaris and cauda equina: Conus extends to the L1-L2 level. No lower spinal cord or conus signal abnormality. Generally normal cauda equina nerve roots.   Paraspinal and other soft tissues: Negative.   Disc levels:   T11-T12 and   T12-L1:  Negative.   L1-L2: Disc desiccation and disc space loss. Disc bulging asymmetric to the left. No spinal or convincing lateral recess stenosis. Mild mostly left side L1 foraminal stenosis.   L2-L3: Severe disc space loss. Circumferential disc osteophyte complex. Mild facet  and ligament flavum hypertrophy greater on the right. No spinal or convincing lateral recess stenosis. Mild more so than moderate bilateral L2 neural foraminal stenosis.   L3-L4: Similar disc space loss. Circumferential disc bulging and endplate spurring with asymmetric left foraminal component of disc on series 7, image 13. Mild facet and ligament flavum hypertrophy. No spinal or lateral recess stenosis. Moderate left and mild to moderate right L3 neural foraminal stenosis.   L4-L5: Severe disc space loss.  Circumferential disc osteophyte complex. Mild to moderate facet and ligament flavum hypertrophy greater on the left. No significant spinal stenosis. Mild left lateral recess stenosis (descending left L5 nerve level series 115, image 27). Moderate left and moderate to severe right (series 7, image 4) L4 neural foraminal stenosis.   L5-S1: Better preserved disc space. Circumferential disc bulging is asymmetric to the left. Moderate facet and ligament flavum hypertrophy greater on the left. Degenerative left facet joint fluid. No spinal stenosis, but moderate left lateral recess stenosis (descending left L5 nerve level series 115, image 32). Severe left L5 neural foraminal stenosis (series 7, image 11). Only borderline to mild right foraminal stenosis.   IMPRESSION: 1. Advanced lumbar spine disc and endplate degeneration in the setting of mild to moderate levoconvex scoliosis. Faint degenerative mid lumbar endplate marrow edema. 2. No significant lumbar spinal stenosis due to capacious underlying spinal canal. Right side neural impingement maximal at the right L4 neural foramina, multifactorial. Multilevel moderate and occasionally severe (left L5 nerve level) left side lateral recess and foraminal stenosis.     Electronically Signed   By: Marlise Simpers M.D.   On: 04/17/2023 08:34     PATIENT SURVEYS:  Modified Oswestry 13/50 (26%)   COGNITION: Overall cognitive status: Within  functional limits for tasks assessed     SENSATION: WFL  MUSCLE LENGTH:   POSTURE: forward neck, movement preference around C5/C6 area, and C3/C4 area, B protracted shoulders, R shoulder higher, thoracic kyphosis, R lateral shift   R  convexity at thoracolumbar junction L lumbar convexity around L2,3,4 area L lumbar rotation  B genu valgus R > L   Improved low back comfort level with manual posturral correction from PT  Prone position: L posterior pelvic rotation    PALPATION: TTP L SI joint area.  L ASIS more posterior compared to R   LUMBAR ROM:   AROM eval  Flexion WFL  Extension WFL with R lumbar rotation  Right lateral flexion WFL  Left lateral flexion WFL  Right rotation ful  Left rotation ful     Lumbar extension with R rotation WFL  Lumbar extension with L rotation  WFL with slight symptoms   (Blank rows = not tested)   LOWER EXTREMITY ROM:     Passive  Right eval Left eval R (06/28/2023) L (06/28/2023)  Hip flexion      Hip extension   12 12  Hip abduction      Hip adduction      Hip internal rotation 22 degrees  18 degrees    Hip external rotation  [facility]    Knee flexion      Knee extension      Ankle dorsiflexion      Ankle plantarflexion      Ankle inversion      Ankle eversion       (Blank rows = not tested)  LOWER EXTREMITY MMT:    MMT Right eval Left eval R (06/28/2023) L (06/28/2023)  Hip flexion 4 4    Hip extension 4 with lumbar extension compensation 4 with lumbar extension compensation 4 4  Hip abduction 4 4    Hip adduction      Hip internal rotation      Hip external rotation      Knee flexion 5 5    Knee extension 5 5    Ankle dorsiflexion      Ankle plantarflexion      Ankle inversion  Ankle eversion       (Blank rows = not tested)  LUMBAR SPECIAL TESTS:  (-) repeated flexion test Long sit test suggests posterior nutation of L innominate.    FUNCTIONAL TESTS:    GAIT: Distance walked: 60 ft Assistive device  utilized: None Level of assistance: Complete Independence Comments: decreased stance L LE, B pelvic drop  TREATMENT DATE: 07/13/2023                                                                                                                                Therapeutic exercise  Standing hip flexor stretch at stair step   L 30 seconds x 3  R 30 seconds x 3  Standing hip machine height 5  Hip extension    L plate 55 for 16X0   R plate 55 for [id number]   Hip abduction                          R plate 40 for  5x5 seconds for 3 sets                         L plate 40 for  5x5 seconds for 3 sets  Standing hip abduction with B UE assist charcoal and green bands around knees  R 5x5 seconds   L 5x5 seconds    Prone manually resisted glute max extension 1x each    Improved exercise technique, movement at target joints, use of target muscles after min to mod verbal, visual, tactile cues.    Neuromuscular re education  Mini Bridge with posterior pelvic tilt 10x5 seconds for 3 sets  Makes her back feel good per pt  Crunch 5x5 seconds for 4 sets   Improved technique, movement at target joints, use of target muscles after mod verbal, visual, tactile cues.      PATIENT EDUCATION:  Education details: there-ex, HEP Person educated: Patient Education method: Explanation, Demonstration, Tactile cues, Verbal cues, and Handouts Education comprehension: verbalized understanding and returned demonstration  HOME EXERCISE PROGRAM: Access Code: [email or web address] URL: [email or web address] Date: 05/17/2023 Prepared by: Suzzane Estes  Exercises - Supine Piriformis Stretch with Leg Straight  - 3 x daily - 7 x weekly - 1 sets - 5 reps - 30 seconds  hold - Seated Hip Internal Rotation AROM  - 1 x daily - 7 x weekly - 3 sets - 10 reps - 5 seconds hold - Right Standing Lateral Shift Correction at Wall - Repetitions  - 1 x daily - 7 x weekly - 3 sets - 10 reps - 5 seconds hold  -  Standing Hip Abduction with Resistance at Ankles and Counter Support  - 1 x daily - 7 x weekly - 3 sets - 10 reps  Yellow band  Red band upgrade on 06/05/2023  Charcoal and green band together upgrade on 07/13/2023   - Supine  March with Posterior Pelvic Tilt  - 1 x daily - 7 x weekly - 3 sets - 10 reps  Replaced with reverse crunch on 06/28/2023  - Supine Lower Trunk Rotation  - 1 x daily - 7 x weekly - 3 sets - 10 reps - 5 seconds hold  - Supine Posterior Pelvic Tilt  - 1 x daily - 7 x weekly - 3 sets - 10 reps - 10 seconds hold  - Forward T with Counter Support  - 1 x daily - 7 x weekly - 2-3 sets - 10 reps    SLS with contralateral UE assist and with contralateral LE hip flexion and extension   R 1 minute x 3  - Quadruped Bent Leg Hip Extension  - 2 x daily - 7 x weekly - 1 sets - 10 reps - 5 seconds hold  Standing PNF chops to the R   Green band 10x5 seconds for 3 sets  - Shoulder Adduction with Anchored Resistance  - 1 x daily - 7 x weekly - 3 sets - 10 reps - 5 seconds hold  Green band.   L to R pressure to L lumbar convexity with gray band    With L trunk side bend   in sitting 10x5 seconds   Discontinued on 06/28/2023   - Bird Dog  - 1 x daily - 7 x weekly - 3 sets - 10 reps - Bilateral Bent Leg Lift  - 1 x daily - 3-4 x weekly - 6 sets - 5 reps - Supine Bridge  - 1 x daily - 7 x weekly - 3 sets - 10 reps - 5 seconds hold    ASSESSMENT:  CLINICAL IMPRESSION: Pt demonstrates decreased low back pain, improved B glute med and max strength, and function since initial evaluation. Pt has made good progress with PT towards goals. Skilled physical therapy services discharged with pt continuing progress with her exercises at  home.    OBJECTIVE IMPAIRMENTS: difficulty walking, decreased ROM, decreased strength, improper body mechanics, postural dysfunction, and pain.   ACTIVITY LIMITATIONS: standing and locomotion level  PARTICIPATION LIMITATIONS:   PERSONAL FACTORS: Age,  Fitness, Time since onset of injury/illness/exacerbation, and 1-2 comorbidities: Osteopenia, arthritis, skin CA are also affecting patient's functional outcome.   REHAB POTENTIAL: Fair    CLINICAL DECISION MAKING: Stable/uncomplicated  EVALUATION COMPLEXITY: Low   GOALS: Goals reviewed with patient? Yes  SHORT TERM GOALS: Target date: 06/02/2023  Pt will be independent with her initial HEP to improve posture, strength, function, and ability to perform standing tasks and ambulate longer distances more comfortably for her back.  Baseline: Pt has started her initial HEP. (05/17/2023); No questions with her HEP (06/20/2023) Goal status: MET      LONG TERM GOALS: Target date: 07/14/2023  Pt will have a decrease in low back pain to 2/10 or less at worst to promote ability to ambulate longer distances, perform standing tasks, and play tennis more comfortably for her back.  Baseline: L low back pain. 5/10 at worst for the past 3 months  (05/17/2023); 4/10 random pain at worst for the past 7 days (06/12/2023); 3-4/10 random back pain. (06/20/2023); 2/10 at most for the past 7 days (07/13/2023)  Goal status: MET  2.  Pt will improve B hip extension and abduction strength by at least 1/2 MMT grade to promote ability to perform standing tasks as well as ambulate with less low back pain.  Baseline:  MMT Right eval Left eval  R ( 06/20/2023) L (06/20/2023) R (07/13/2023) L  (07/13/2023)  Hip extension 4 with lumbar extension compensation 4 with lumbar extension compensation 4 4 4+ 4+  Hip abduction 4 4 4+ 4+     (05/17/2023)   Goal status: MET  3.  Pt will improve B hip IR ROM by at least 10 degrees to promote ability to ambulate longer distances more comfortably for her back.  Baseline:  Passive  Right eval Left eval R (06/20/2023) L (06/20/2023)  Hip internal rotation 22 degrees  18 degrees 28 degrees  30 degrees   (05/17/2023)  Goal status: PARTIALLY MET   4.  Pt will improve her  Modified Oswestry Low Back Pain disability Questionnaire by at least 10% as a demonstration of improved function.  Baseline: 13/50 (26%) (05/17/2023); 8/50 (16%) (06/20/2023) Goal status:  MET    PLAN:  PT FREQUENCY: 1-2x/week  PT DURATION: 8 weeks  PLANNED INTERVENTIONS: 97110-Therapeutic exercises, 97530- Therapeutic activity, 97112- Neuromuscular re-education, 97535- Self Care, 40981- Manual therapy, G0283- Electrical stimulation (unattended), 19147- Ionotophoresis 4mg /ml Dexamethasone, Patient/Family education, and Dry Needling.  PLAN FOR NEXT SESSION: Posture, trunk and hip strengthening, hip ROM, manual techniques, modalities PRN  Thank you for your referral.  Liam Cammarata, PT, DPT 07/13/2023, 11:10 AM

## 2023-07-17 ENCOUNTER — Encounter

## 2023-07-20 ENCOUNTER — Encounter

## 2023-08-30 DIAGNOSIS — F4323 Adjustment disorder with mixed anxiety and depressed mood: Secondary | ICD-10-CM

## 2023-08-30 MED ORDER — FLUOXETINE HCL 20 MG PO CAPS: 20.0000 mg | ORAL_CAPSULE | Freq: Every day | ORAL | 0 refills | Status: DC

## 2023-09-01 ENCOUNTER — Encounter: Payer: Self-pay | Admitting: Pharmacist

## 2023-09-01 NOTE — Progress Notes (Signed)
 Pharmacy Quality Measure Review  This patient is appearing on a report for being at risk of failing the adherence measure for cholesterol (statin) medications this calendar year.   Medication: simvastatin  20 mg Last fill date: 03/27/23 for 90 day supply  Insurance report was not up to date. No action needed at this time.  Medication has been refilled as of 06/25/23 x90 day supply.   One additional 90-d refill remaining. Due ~1 month.

## 2023-09-20 ENCOUNTER — Other Ambulatory Visit: Payer: Self-pay | Admitting: Primary Care

## 2023-09-20 DIAGNOSIS — F4323 Adjustment disorder with mixed anxiety and depressed mood: Secondary | ICD-10-CM

## 2023-09-26 DIAGNOSIS — K080 Exfoliation of teeth due to systemic causes: Secondary | ICD-10-CM | POA: Diagnosis not present

## 2023-09-28 DIAGNOSIS — K080 Exfoliation of teeth due to systemic causes: Secondary | ICD-10-CM | POA: Diagnosis not present

## 2023-10-03 DIAGNOSIS — K080 Exfoliation of teeth due to systemic causes: Secondary | ICD-10-CM | POA: Diagnosis not present

## 2023-10-05 DIAGNOSIS — D2272 Melanocytic nevi of left lower limb, including hip: Secondary | ICD-10-CM | POA: Diagnosis not present

## 2023-10-05 DIAGNOSIS — L570 Actinic keratosis: Secondary | ICD-10-CM | POA: Diagnosis not present

## 2023-10-05 DIAGNOSIS — D225 Melanocytic nevi of trunk: Secondary | ICD-10-CM | POA: Diagnosis not present

## 2023-10-05 DIAGNOSIS — D2261 Melanocytic nevi of right upper limb, including shoulder: Secondary | ICD-10-CM | POA: Diagnosis not present

## 2023-10-05 DIAGNOSIS — D2262 Melanocytic nevi of left upper limb, including shoulder: Secondary | ICD-10-CM | POA: Diagnosis not present

## 2023-10-10 DIAGNOSIS — K080 Exfoliation of teeth due to systemic causes: Secondary | ICD-10-CM | POA: Diagnosis not present

## 2023-10-12 NOTE — Telephone Encounter (Signed)
 Can we document her recent vaccine?

## 2023-10-12 NOTE — Telephone Encounter (Signed)
 Vaccines documented as requested.

## 2023-10-25 DIAGNOSIS — K080 Exfoliation of teeth due to systemic causes: Secondary | ICD-10-CM | POA: Diagnosis not present

## 2023-11-27 ENCOUNTER — Encounter: Payer: Self-pay | Admitting: Radiology

## 2023-12-17 DIAGNOSIS — E7800 Pure hypercholesterolemia, unspecified: Secondary | ICD-10-CM

## 2023-12-17 DIAGNOSIS — F4323 Adjustment disorder with mixed anxiety and depressed mood: Secondary | ICD-10-CM

## 2023-12-18 MED ORDER — SIMVASTATIN 20 MG PO TABS: ORAL_TABLET | ORAL | 0 refills | Status: AC

## 2023-12-19 MED ORDER — DULOXETINE HCL 20 MG PO CPEP: 20.0000 mg | ORAL_CAPSULE | Freq: Every day | ORAL | 0 refills | Status: AC

## 2024-01-30 VITALS — BP 110/64 | Ht 66.0 in | Wt 164.6 lb

## 2024-01-30 DIAGNOSIS — Z0000 Encounter for general adult medical examination without abnormal findings: Secondary | ICD-10-CM | POA: Diagnosis not present

## 2024-01-30 DIAGNOSIS — Z1231 Encounter for screening mammogram for malignant neoplasm of breast: Secondary | ICD-10-CM

## 2024-01-30 NOTE — Patient Instructions (Signed)
 Natalie Sosa,  Thank you for taking the time for your Medicare Wellness Visit. I appreciate your continued commitment to your health goals. Please review the care plan we discussed, and feel free to reach out if I can assist you further.  Please note that Annual Wellness Visits do not include a physical exam. Some assessments may be limited, especially if the visit was conducted virtually. If needed, we may recommend an in-person follow-up with your provider.  Ongoing Care Seeing your primary care provider every 3 to 6 months helps us  monitor your health and provide consistent, personalized care.   Referrals If a referral was made during today's visit and you haven't received any updates within two weeks, please contact the referred provider directly to check on the status.  Recommended Screenings:  Health Maintenance  Topic Date Due   Medicare Annual Wellness Visit  01/27/2024   COVID-19 Vaccine (9 - 2025-26 season) 04/09/2024   Breast Cancer Screening  04/12/2024   Colon Cancer Screening  09/25/2026   Pneumococcal Vaccine for age over 20  Completed   Flu Shot  Completed   Osteoporosis screening with Bone Density Scan  Completed   Hepatitis C Screening  Completed   Zoster (Shingles) Vaccine  Completed   Meningitis B Vaccine  Aged Out   DTaP/Tdap/Td vaccine  Discontinued       01/23/2024    9:16 AM  Advanced Directives  Does Patient Have a Medical Advance Directive? Yes  Type of Estate Agent of [place];Living will  Does patient want to make changes to medical advance directive? No - Patient declined  Copy of Healthcare Power of Attorney in Chart? Yes - validated most recent copy scanned in chart (See row information)    Vision: Annual vision screenings are recommended for early detection of glaucoma, cataracts, and diabetic retinopathy. These exams can also reveal signs of chronic conditions such as diabetes and high blood pressure.  Dental: Annual dental  screenings help detect early signs of oral cancer, gum disease, and other conditions linked to overall health, including heart disease and diabetes.

## 2024-01-30 NOTE — Progress Notes (Signed)
 "  Chief Complaint  Patient presents with   Medicare Wellness     Subjective:   Natalie Sosa is a 79 y.o. female who presents for a Medicare Annual Wellness Visit.  Visit info / Clinical Intake: Medicare Wellness Visit Type:: Subsequent Annual Wellness Visit Persons participating in visit and providing information:: patient Medicare Wellness Visit Mode:: In-person (required for WTM) Interpreter Needed?: No Pre-visit prep was completed: yes AWV questionnaire completed by patient prior to visit?: yes Date:: 01/23/24 Living arrangements:: (Patient-Rptd) lives with spouse/significant other Patient's Overall Health Status Rating: (Patient-Rptd) good Typical amount of pain: (Patient-Rptd) some Does pain affect daily life?: (!) (Patient-Rptd) yes Are you currently prescribed opioids?: no  Dietary Habits and Nutritional Risks How many meals a day?: (Patient-Rptd) 3 Eats fruit and vegetables daily?: (Patient-Rptd) yes Most meals are obtained by: (Patient-Rptd) preparing own meals In the last 2 weeks, have you had any of the following?: none Diabetic:: no  Functional Status Activities of Daily Living (to include ambulation/medication): (Patient-Rptd) Independent Ambulation: (Patient-Rptd) Independent Medication Administration: (Patient-Rptd) Independent Home Management (perform basic housework or laundry): (Patient-Rptd) Independent Manage your own finances?: (Patient-Rptd) yes Primary transportation is: (Patient-Rptd) driving Concerns about vision?: no *vision screening is required for WTM* Concerns about hearing?: no  Fall Screening Falls in the past year?: (Patient-Rptd) 0 Number of falls in past year: 0 Was there an injury with Fall?: 0 Fall Risk Category Calculator: 0 Patient Fall Risk Level: Low Fall Risk  Fall Risk Patient at Risk for Falls Due to: No Fall Risks Fall risk Follow up: Falls evaluation completed; Education provided; Falls prevention discussed  Home and  Transportation Safety: All rugs have non-skid backing?: (Patient-Rptd) yes All stairs or steps have railings?: (Patient-Rptd) N/A, no stairs Grab bars in the bathtub or shower?: (Patient-Rptd) yes Have non-skid surface in bathtub or shower?: (Patient-Rptd) yes Good home lighting?: (Patient-Rptd) yes Regular seat belt use?: (Patient-Rptd) yes Hospital stays in the last year:: (Patient-Rptd) no  Cognitive Assessment Difficulty concentrating, remembering, or making decisions? : (Patient-Rptd) yes Will 6CIT or Mini Cog be Completed: yes What year is it?: 0 points What month is it?: 0 points Give patient an address phrase to remember (5 components): [address] [place]  About what time is it?: 0 points Count backwards from 20 to 1: 0 points Say the months of the year in reverse: 0 points Repeat the address phrase from earlier: 0 points 6 CIT Score: 0 points  Advance Directives (For Healthcare) Does Patient Have a Medical Advance Directive?: Yes Does patient want to make changes to medical advance directive?: No - Patient declined Type of Advance Directive: Healthcare Power of [place]; Living will Copy of Healthcare Power of Attorney in Chart?: Yes - validated most recent copy scanned in chart (See row information) Copy of Living Will in Chart?: Yes - validated most recent copy scanned in chart (See row information)  Reviewed/Updated  Reviewed/Updated: Reviewed All (Medical, Surgical, Family, Medications, Allergies, Care Teams, Patient Goals)    Allergies (verified) Ezetimibe-simvastatin , Hydrocodone, and Prednisone   Current Medications (verified) Outpatient Encounter Medications as of 01/30/2024  Medication Sig   CALCIUM-VITAMIN D PO Take 2 tablets by mouth daily.   diphenhydrAMINE (BENADRYL) 25 mg capsule Take 25 mg by mouth 2 (two) times daily as needed.   DULoxetine  (CYMBALTA ) 20 MG capsule Take 1 capsule (20 mg total) by mouth daily. for anxiety and depression.    glucosamine-chondroitin 500-400 MG tablet Take 1 tablet by mouth in the morning and at bedtime.  1500-1200   Multiple Vitamin (MULTIVITAMIN) tablet Take 1 tablet by mouth daily.   simvastatin  (ZOCOR ) 20 MG tablet TAKE 1 TABLET(20 MG) BY MOUTH AT BEDTIME for cholesterol.   No facility-administered encounter medications on file as of 01/30/2024.    History: Past Medical History:  Diagnosis Date   Arthritis    right knee, right thumb   Bright red rectal bleeding 09/01/2021   Cancer (HCC)    skin   Cataract    had removed from both eyes Nov. 2024   COVID-19 12/31/2020   Depression    mild, maybe winter?   ENDOMETRIOSIS 10/04/2006   Qualifier: History of  By: Bartley MD, Lamar Mulch    GERD (gastroesophageal reflux disease)    occasional   Suprapubic pressure 09/01/2021   Past Surgical History:  Procedure Laterality Date   ACHILLES TENDON REPAIR  06/14/2004   CATARACT EXTRACTION W/PHACO Right 11/09/2022   Procedure: CATARACT EXTRACTION PHACO AND INTRAOCULAR LENS PLACEMENT (IOC) RIGHT TORIC 7.96 00:48.4;  Surgeon: Mittie Gaskin, MD;  Location: [facility];  Service: Ophthalmology;  Laterality: Right;   CATARACT EXTRACTION W/PHACO Left 12/07/2022   Procedure: CATARACT EXTRACTION PHACO AND INTRAOCULAR LENS PLACEMENT (IOC) LEFT  CLAREON VIVITY TORIC LENS 7.43 00:49.5;  Surgeon: Mittie Gaskin, MD;  Location: [facility];  Service: Ophthalmology;  Laterality: Left;   CESAREAN SECTION     placenta previa   CHOLECYSTECTOMY  06/13/2001   COLONOSCOPY WITH PROPOFOL  N/A 09/24/2021   Procedure: COLONOSCOPY WITH PROPOFOL ;  Surgeon: Unk Corinn Skiff, MD;  Location: [facility];  Service: Gastroenterology;  Laterality: N/A;   EYE SURGERY     11/09/2022, 12/07/2022   Family History  Problem Relation Age of Onset   Heart disease Mother    Dementia Mother    Cancer Father        Pancreatic   Stroke Father    Breast cancer Paternal Grandmother 62   Breast  cancer Paternal Aunt    Social History   Occupational History   Not on file  Tobacco Use   Smoking status: Never   Smokeless tobacco: Never  Vaping Use   Vaping status: Never Used  Substance and Sexual Activity   Alcohol use: Yes    Comment: glass of wine occ   Drug use: Never   Sexual activity: Yes   Tobacco Counseling Counseling given: Not Answered  SDOH Screenings   Food Insecurity: No Food Insecurity (01/23/2024)  Housing: Unknown (01/23/2024)  Transportation Needs: No Transportation Needs (01/23/2024)  Utilities: Not At Risk (01/30/2024)  Alcohol Screen: Low Risk (01/23/2024)  Depression (PHQ2-9): Low Risk (01/30/2024)  Financial Resource Strain: Low Risk (01/23/2024)  Physical Activity: Insufficiently Active (01/23/2024)  Social Connections: Moderately Integrated (01/23/2024)  Stress: No Stress Concern Present (01/23/2024)  Tobacco Use: Low Risk (01/30/2024)  Health Literacy: Adequate Health Literacy (01/30/2024)   See flowsheets for full screening details  Depression Screen PHQ 2 & 9 Depression Scale- Over the past 2 weeks, how often have you been bothered by any of the following problems? Little interest or pleasure in doing things: 0 Feeling down, depressed, or hopeless (PHQ Adolescent also includes...irritable): 1 PHQ-2 Total Score: 1 Trouble falling or staying asleep, or sleeping too much: 2 Feeling tired or having little energy: 0 Poor appetite or overeating (PHQ Adolescent also includes...weight loss): 0 Feeling bad about yourself - or that you are a failure or have let yourself or your family down: 0 Trouble concentrating on things, such as reading the newspaper or watching  television (PHQ Adolescent also includes...like school work): 0 Moving or speaking so slowly that other people could have noticed. Or the opposite - being so fidgety or restless that you have been moving around a lot more than usual: 0 Thoughts that you would be better off dead, or of hurting  yourself in some way: 0 PHQ-9 Total Score: 3 If you checked off any problems, how difficult have these problems made it for you to do your work, take care of things at home, or get along with other people?: Somewhat difficult  Depression Treatment Depression Interventions/Treatment : Counseling     Goals Addressed             This Visit's Progress    COMPLETED: Increase physical activity       Starting 01/19/2018, I will continue to play tennis for at least 90 minutes 3-4 days per week.      Patient Stated   On track    01/28/2019, I will maintain and continue medications as prescribed.      COMPLETED: Patient Stated       02/07/2020, I will continue to play tennis 3 days a week for 1 1/2 hours.     Patient Stated   On track    Would like to maintain current routine     Patient Stated   On track    01/27/23 continue this goal:Lose weight              Objective:    Today's Vitals   01/30/24 0812  BP: 110/64  Weight: 164 lb 9.6 oz (74.7 kg)  Height: 5' 6 (1.676 m)   Body mass index is 26.57 kg/m.  Hearing/Vision screen Vision Screening - Comments:: UTD w/visits to Dr Mittie Immunizations and Health Maintenance Health Maintenance  Topic Date Due   Medicare Annual Wellness (AWV)  01/27/2024   COVID-19 Vaccine (9 - 2025-26 season) 04/09/2024   Mammogram  04/12/2024   Colonoscopy  09/25/2026   Pneumococcal Vaccine: 50+ Years  Completed   Influenza Vaccine  Completed   Bone Density Scan  Completed   Hepatitis C Screening  Completed   Zoster Vaccines- Shingrix  Completed   Meningococcal B Vaccine  Aged Out   DTaP/Tdap/Td  Discontinued        Assessment/Plan:  This is a routine wellness examination for Toye.  Patient Care Team: Gretta Comer POUR, NP as PCP - General (Internal Medicine) Regal, Pas[place] RAMAN, DPM as Consulting Physician (Podiatry) Mittie Gaskin, MD as Referring Physician (Ophthalmology) Isenstein, Arin L, MD as Consulting Physician  (Dermatology) Josue Oneil ORN, DDS as Referring Physician (Dentistry)  I have personally reviewed and noted the following in the patients chart:   Medical and social history Use of alcohol, tobacco or illicit drugs  Current medications and supplements including opioid prescriptions. Functional ability and status Nutritional status Physical activity Advanced directives List of other physicians Hospitalizations, surgeries, and ER visits in previous 12 months Vitals Screenings to include cognitive, depression, and falls Referrals and appointments  No orders of the defined types were placed in this encounter.  In addition, I have reviewed and discussed with patient certain preventive protocols, quality metrics, and best practice recommendations. A written personalized care plan for preventive services as well as general preventive health recommendations were provided to patient.   Erminio LITTIE Saris, LPN   08/27/7971    After Visit Summary: (In Person-Declined) Patient declined AVS at this time.Pt says she will view in Mychart.  Nurse Notes: No  voiced or noted concerns at this time Patient advised to keep follow-up appointment with PCP ([id number]) Appointment(s) made: (AWV/CPE [phone]) HM Addressed: Mammogram ordered  Pt relays she has rt knee pain intermittently in which she sometimes uses her knee brace and infrequently Tylenol. She says stop playing tennis has helped. "

## 2024-03-05 ENCOUNTER — Encounter: Payer: Medicare Other | Admitting: Primary Care

## 2024-04-15 ENCOUNTER — Encounter

## 2025-03-07 ENCOUNTER — Encounter: Admitting: Primary Care

## 2025-03-07 ENCOUNTER — Ambulatory Visit
